# Patient Record
Sex: Male | Born: 1955 | Race: White | Hispanic: No | Smoking: Former smoker
Health system: Southern US, Community
[De-identification: ages and names within clinical notes are randomized; demographics above are authoritative.]

## PROBLEM LIST (undated history)

## (undated) DIAGNOSIS — E119 Type 2 diabetes mellitus without complications: Secondary | ICD-10-CM

## (undated) DIAGNOSIS — K219 Gastro-esophageal reflux disease without esophagitis: Secondary | ICD-10-CM

## (undated) DIAGNOSIS — H269 Unspecified cataract: Secondary | ICD-10-CM

## (undated) DIAGNOSIS — M5136 Other intervertebral disc degeneration, lumbar region: Secondary | ICD-10-CM

## (undated) DIAGNOSIS — E7800 Pure hypercholesterolemia, unspecified: Secondary | ICD-10-CM

## (undated) DIAGNOSIS — K6389 Other specified diseases of intestine: Secondary | ICD-10-CM

## (undated) DIAGNOSIS — F32A Depression, unspecified: Secondary | ICD-10-CM

## (undated) DIAGNOSIS — M549 Dorsalgia, unspecified: Secondary | ICD-10-CM

## (undated) DIAGNOSIS — I219 Acute myocardial infarction, unspecified: Secondary | ICD-10-CM

## (undated) DIAGNOSIS — I10 Essential (primary) hypertension: Secondary | ICD-10-CM

## (undated) DIAGNOSIS — F419 Anxiety disorder, unspecified: Secondary | ICD-10-CM

## (undated) DIAGNOSIS — I509 Heart failure, unspecified: Secondary | ICD-10-CM

## (undated) DIAGNOSIS — G8929 Other chronic pain: Secondary | ICD-10-CM

## (undated) DIAGNOSIS — R112 Nausea with vomiting, unspecified: Secondary | ICD-10-CM

## (undated) DIAGNOSIS — F329 Major depressive disorder, single episode, unspecified: Secondary | ICD-10-CM

## (undated) DIAGNOSIS — Z949 Transplanted organ and tissue status, unspecified: Secondary | ICD-10-CM

## (undated) DIAGNOSIS — Z9889 Other specified postprocedural states: Secondary | ICD-10-CM

## (undated) DIAGNOSIS — A0472 Enterocolitis due to Clostridium difficile, not specified as recurrent: Secondary | ICD-10-CM

## (undated) HISTORY — DX: Other chronic pain: G89.29

## (undated) HISTORY — DX: Gastro-esophageal reflux disease without esophagitis: K21.9

## (undated) HISTORY — DX: Dorsalgia, unspecified: M54.9

## (undated) HISTORY — DX: Other intervertebral disc degeneration, lumbar region: M51.36

## (undated) HISTORY — DX: Pure hypercholesterolemia, unspecified: E78.00

## (undated) HISTORY — DX: Enterocolitis due to Clostridium difficile, not specified as recurrent: A04.72

## (undated) HISTORY — DX: Acute myocardial infarction, unspecified: I21.9

## (undated) HISTORY — DX: Type 2 diabetes mellitus without complications: E11.9

## (undated) HISTORY — DX: Essential (primary) hypertension: I10

## (undated) HISTORY — PX: FEMUR SURGERY: SHX943

## (undated) HISTORY — DX: Depression, unspecified: F32.A

## (undated) HISTORY — DX: Heart failure, unspecified: I50.9

## (undated) HISTORY — DX: Anxiety disorder, unspecified: F41.9

## (undated) HISTORY — DX: Other specified diseases of intestine: K63.89

## (undated) HISTORY — PX: ANKLE SURGERY: SHX546

## (undated) HISTORY — DX: Major depressive disorder, single episode, unspecified: F32.9

## (undated) HISTORY — PX: EYE SURGERY: SHX253

## (undated) HISTORY — DX: Unspecified cataract: H26.9

---
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
## 1998-06-01 ENCOUNTER — Inpatient Hospital Stay (HOSPITAL_COMMUNITY): Admission: EM | Admit: 1998-06-01 | Discharge: 1998-06-06 | Payer: Self-pay | Admitting: Emergency Medicine

## 1998-07-26 ENCOUNTER — Inpatient Hospital Stay (HOSPITAL_COMMUNITY): Admission: AD | Admit: 1998-07-26 | Discharge: 1998-08-01 | Payer: Self-pay | Admitting: Cardiovascular Disease

## 1998-07-26 ENCOUNTER — Encounter: Payer: Self-pay | Admitting: Cardiovascular Disease

## 1998-07-28 ENCOUNTER — Encounter: Payer: Self-pay | Admitting: Cardiovascular Disease

## 1998-09-03 ENCOUNTER — Encounter: Payer: Self-pay | Admitting: Cardiovascular Disease

## 1998-09-03 ENCOUNTER — Inpatient Hospital Stay (HOSPITAL_COMMUNITY): Admission: EM | Admit: 1998-09-03 | Discharge: 1998-09-06 | Payer: Self-pay | Admitting: Emergency Medicine

## 1998-09-04 ENCOUNTER — Encounter: Payer: Self-pay | Admitting: Cardiovascular Disease

## 1998-10-27 DIAGNOSIS — I509 Heart failure, unspecified: Secondary | ICD-10-CM

## 1998-10-27 DIAGNOSIS — Z949 Transplanted organ and tissue status, unspecified: Secondary | ICD-10-CM

## 1998-10-27 DIAGNOSIS — I219 Acute myocardial infarction, unspecified: Secondary | ICD-10-CM

## 1998-10-27 HISTORY — DX: Acute myocardial infarction, unspecified: I21.9

## 1998-10-27 HISTORY — PX: HEART TRANSPLANT: SHX268

## 1998-10-27 HISTORY — DX: Heart failure, unspecified: I50.9

## 1998-10-27 HISTORY — DX: Transplanted organ and tissue status, unspecified: Z94.9

## 1998-12-18 ENCOUNTER — Emergency Department (HOSPITAL_COMMUNITY): Admission: EM | Admit: 1998-12-18 | Discharge: 1998-12-18 | Payer: Self-pay | Admitting: *Deleted

## 1999-01-07 ENCOUNTER — Emergency Department (HOSPITAL_COMMUNITY): Admission: EM | Admit: 1999-01-07 | Discharge: 1999-01-07 | Payer: Self-pay | Admitting: Emergency Medicine

## 1999-01-07 ENCOUNTER — Encounter: Payer: Self-pay | Admitting: Emergency Medicine

## 1999-06-28 ENCOUNTER — Emergency Department (HOSPITAL_COMMUNITY): Admission: EM | Admit: 1999-06-28 | Discharge: 1999-06-28 | Payer: Self-pay | Admitting: Emergency Medicine

## 1999-06-28 ENCOUNTER — Encounter: Payer: Self-pay | Admitting: Emergency Medicine

## 2001-05-19 ENCOUNTER — Encounter: Payer: Self-pay | Admitting: Emergency Medicine

## 2001-05-19 ENCOUNTER — Emergency Department (HOSPITAL_COMMUNITY): Admission: EM | Admit: 2001-05-19 | Discharge: 2001-05-19 | Payer: Self-pay | Admitting: Emergency Medicine

## 2002-10-27 DIAGNOSIS — M51369 Other intervertebral disc degeneration, lumbar region without mention of lumbar back pain or lower extremity pain: Secondary | ICD-10-CM

## 2002-10-27 DIAGNOSIS — M5136 Other intervertebral disc degeneration, lumbar region: Secondary | ICD-10-CM

## 2002-10-27 DIAGNOSIS — H269 Unspecified cataract: Secondary | ICD-10-CM

## 2002-10-27 HISTORY — DX: Unspecified cataract: H26.9

## 2002-10-27 HISTORY — DX: Other intervertebral disc degeneration, lumbar region without mention of lumbar back pain or lower extremity pain: M51.369

## 2002-10-27 HISTORY — DX: Other intervertebral disc degeneration, lumbar region: M51.36

## 2005-12-26 ENCOUNTER — Ambulatory Visit (HOSPITAL_COMMUNITY): Admission: RE | Admit: 2005-12-26 | Discharge: 2005-12-26 | Payer: Self-pay | Admitting: Pulmonary Disease

## 2006-07-14 ENCOUNTER — Emergency Department (HOSPITAL_COMMUNITY): Admission: EM | Admit: 2006-07-14 | Discharge: 2006-07-14 | Payer: Self-pay | Admitting: Emergency Medicine

## 2009-03-22 ENCOUNTER — Ambulatory Visit (HOSPITAL_COMMUNITY): Admission: RE | Admit: 2009-03-22 | Discharge: 2009-03-22 | Payer: Self-pay | Admitting: Pulmonary Disease

## 2009-09-07 ENCOUNTER — Ambulatory Visit (HOSPITAL_COMMUNITY): Admission: RE | Admit: 2009-09-07 | Discharge: 2009-09-07 | Payer: Self-pay | Admitting: Pulmonary Disease

## 2009-09-27 ENCOUNTER — Encounter (HOSPITAL_COMMUNITY): Admission: RE | Admit: 2009-09-27 | Discharge: 2009-10-23 | Payer: Self-pay | Admitting: Pulmonary Disease

## 2009-11-30 ENCOUNTER — Ambulatory Visit (HOSPITAL_COMMUNITY): Admission: RE | Admit: 2009-11-30 | Discharge: 2009-11-30 | Payer: Self-pay | Admitting: Pulmonary Disease

## 2011-02-04 ENCOUNTER — Ambulatory Visit (HOSPITAL_COMMUNITY): Payer: Self-pay | Admitting: Psychology

## 2011-02-11 ENCOUNTER — Ambulatory Visit (INDEPENDENT_AMBULATORY_CARE_PROVIDER_SITE_OTHER): Payer: Medicaid Other | Admitting: Psychology

## 2011-02-11 ENCOUNTER — Ambulatory Visit (HOSPITAL_COMMUNITY): Payer: Self-pay | Admitting: Psychology

## 2011-02-11 DIAGNOSIS — F332 Major depressive disorder, recurrent severe without psychotic features: Secondary | ICD-10-CM

## 2011-02-11 DIAGNOSIS — F4001 Agoraphobia with panic disorder: Secondary | ICD-10-CM

## 2011-03-03 ENCOUNTER — Encounter (INDEPENDENT_AMBULATORY_CARE_PROVIDER_SITE_OTHER): Payer: Medicaid Other | Admitting: Psychology

## 2011-03-03 DIAGNOSIS — F4001 Agoraphobia with panic disorder: Secondary | ICD-10-CM

## 2011-03-03 DIAGNOSIS — F332 Major depressive disorder, recurrent severe without psychotic features: Secondary | ICD-10-CM

## 2011-03-04 ENCOUNTER — Ambulatory Visit (INDEPENDENT_AMBULATORY_CARE_PROVIDER_SITE_OTHER): Payer: Medicaid Other | Admitting: Psychiatry

## 2011-03-04 DIAGNOSIS — F329 Major depressive disorder, single episode, unspecified: Secondary | ICD-10-CM

## 2011-03-07 NOTE — Group Therapy Note (Signed)
NAMELOLA, LOFARO             ACCOUNT NO.:  [id number]  MEDICAL RECORD NO.:  [id number]           PATIENT TYPE:  A  LOCATION:  [facility]                           FACILITY:  [facility]  PHYSICIAN:  Tejay Hubert T. Sydney Azure, M.D.   DATE OF BIRTH:  January 06, 1956                                PROGRESS NOTE   HISTORY OF PRESENT ILLNESS: The patient is a 55 year old Caucasian, divorced male who is referred from Dr. Juanetta Gosling for treatment of his depression and anxiety.  The patient endorsed that he has a long history of depression and anxiety for more than 20 years and has been taking Xanax, Valium and tried numerous antidepressant.  In the past 3 years, he feels his anxiety and depression is getting worse.  The patient had heart transplant in 2000. He has been not working for the past 3 years as unable to find job due to the recession.  The patient endorsed increased financial difficulties.  Feels sad as he cannot move around and work much.  He worries about his physical condition.  Endorsed decreased energy, decreased concentration, racing thoughts and poor sleep.  He also admitted having anxiety attacks once in awhile when he feels very nervous, anxious, hopeless and helpless about the future.  He has been taking Xanax, which has been increased last year from 0.5 three times a day to 1 mg 3 times.  He feels he has been feeling somewhat better in anxiety but however, he continued to endorse hopeless, helpless and worthless.  He admitted getting easily irritable, agitated, angry and frustrated.  He has been going to multiple doctors and  having difficulty keeping up with his appointments.  He has been going regularly to see his heart doctor.  Last year he was very nervous and felt that he is having a heart attack or his heart is not working and he had a cardiac catheterization and his heart transplant coordinator explained to him that his heart was okay and he is having anxiety and depression and recommended  to see psychiatrist.  Dr. Juanetta Gosling had tried him on Celexa, Prozac, Viibryd, Paxil and Cymbalta; however, the patient stopped taking all these medication, either due to poor response of sexual side effects.  He denies any violence, anger, paranoia, hallucination or psychosis.  Also denies any active suicidal thinking or homicidal thinking.  PAST PSYCHIATRIC HISTORY: As mentioned above, the patient has tried these above medication through primary care doctor; however, the only medicine he is taking is Xanax 1 mg 3 times a day.  He denies any history of inpatient psychiatric treatment or past suicidal attempt.  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  He also had bone grafting in 1975 due to fractured femur and ankle.  He also has degenerative disk disease, diabetes and cataracts.  PRIMARY CARE DOCTOR: Dr. Juanetta Gosling.  CURRENT MEDICATION: 1. Metformin 1000 mg 2 tablets a day. 2. Tacrolimus 1 mcg 2 tablets a day. 3. CellCept, he does not know the dose. 4. Ramipril 2.5 mg 1 daily. 5. Xanax 1 mg 3 times a day. 6. Amlodipine 10 mg daily. 7. Omeprazole 20 mg daily.  8. Aspirin 81 mg a day.  He also sees Dr .[facility]herly Hensen at [facility] for his regular heart checkup.  PSYCHOSOCIAL HISTORY: The patient was born and raised in [place], [place].  [place].  The patient has a 62 year old sister, a 62 year old sister who died several years ago.  Both of his parents her deceased.  The patient has extensive history of cardiac problem in the family.  Father died due to heart attack in his early age and mother died due to a stroke.  The patient is currently living with his girlfriend and his 7 year old son.  He is divorced and has no contact with his ex-wife who left her son when he was only 75 years old. The patient has a very good and steady relationship with his girlfriend who is helping him pay the utility bills.  The  patient only gets child support to support the family.  WORK AND EDUCATION: The patient is only 9th grade education.  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  ALCOHOL AND SUBSTANCE ABUSE: The patient endorsed history of heavy drinking until he had heart problem and he stopped, though he admitted, he still uses half pint of alcohol but lasts only 4 months.  He continued to smoke tobacco but no other illegal drugs.  FAMILY HISTORY: The patient endorsed 3 cousins have history of depression and anxiety, one of his cousins committed suicide..  MENTAL STATUS EXAM: The patient is a middle-aged man who appears to be his stated age.  He is casually dressed.  He maintained fair eye contact.  His speech is soft but clear and coherent.  His thought process was logical, linear and goal-directed.  He described his mood as anxious and depressed and affect was constricted.  There were no paranoia, delusions or obsession noted.  He denies any auditory hallucinations, suicidal thoughts or homicidal thoughts.  Attention and concentration were distracted at times but overall he kept the conversation intact.  He is alert and oriented x3.  His insight, judgment, impulse control were okay.  DIAGNOSIS: Axis I:  Mood disorder due to general medical condition, major depressive disorder recurrent, anxiety disorder not otherwise specified. Axis II:  Deferred. Axis III:  Please see medical history. Axis IV:  Moderate. Axis V:  55-60.  PLAN: I talked with the patient in detail.  He admitted he has taken the antidepressant in the past but stopped due to side effects, especially sexual side effects.  He talked about starting the Wellbutrin, which he had taken in the past when he was trying to quit smoking.  He is willing to try that medication.  We will start 75 mg in the morning; however, I strongly recommended to contact his primary care doctor and heart  doctor if it is not contraindicated due to the history of his heart transplant. Patient told that he will call this afternoon.  His heart transplant coordinator is Scott in [place] and also he will leave the message for Dr. Juanetta Gosling if it is okay that he can start the Wellbutrin 75 mg in the morning.  I explained the risks and benefits of medication in detail and recommended strongly to call us back if he has any side effects or any questions.  He will continue to see Dr.  Kieth Brightly for one-to-one counseling and therapy and I will see him again in 2 weeks.     Mima Cranmore T. Lolly Mustache, M.D.  STA/MEDQ  D:  03/04/2011  T:  03/04/2011  Job:  [id number]  Electronically Signed by Kathryne Sharper M.D. on 03/07/2011 09:15:11 AM

## 2011-03-17 ENCOUNTER — Encounter (INDEPENDENT_AMBULATORY_CARE_PROVIDER_SITE_OTHER): Payer: Medicaid Other | Admitting: Psychology

## 2011-03-17 DIAGNOSIS — F332 Major depressive disorder, recurrent severe without psychotic features: Secondary | ICD-10-CM

## 2011-03-17 DIAGNOSIS — F4001 Agoraphobia with panic disorder: Secondary | ICD-10-CM

## 2011-03-18 ENCOUNTER — Encounter (INDEPENDENT_AMBULATORY_CARE_PROVIDER_SITE_OTHER): Payer: Medicaid Other | Admitting: Psychiatry

## 2011-03-18 DIAGNOSIS — F329 Major depressive disorder, single episode, unspecified: Secondary | ICD-10-CM

## 2011-04-01 ENCOUNTER — Encounter (HOSPITAL_COMMUNITY): Payer: Medicaid Other | Admitting: Psychology

## 2011-04-07 ENCOUNTER — Ambulatory Visit (INDEPENDENT_AMBULATORY_CARE_PROVIDER_SITE_OTHER): Payer: Medicaid Other | Admitting: Psychiatry

## 2011-04-07 DIAGNOSIS — F411 Generalized anxiety disorder: Secondary | ICD-10-CM

## 2011-04-07 DIAGNOSIS — F331 Major depressive disorder, recurrent, moderate: Secondary | ICD-10-CM

## 2011-04-14 ENCOUNTER — Encounter (INDEPENDENT_AMBULATORY_CARE_PROVIDER_SITE_OTHER): Payer: Medicaid Other | Admitting: Psychiatry

## 2011-04-14 DIAGNOSIS — F331 Major depressive disorder, recurrent, moderate: Secondary | ICD-10-CM

## 2011-04-14 DIAGNOSIS — F411 Generalized anxiety disorder: Secondary | ICD-10-CM

## 2011-04-15 ENCOUNTER — Encounter (INDEPENDENT_AMBULATORY_CARE_PROVIDER_SITE_OTHER): Payer: Medicaid Other | Admitting: Psychiatry

## 2011-04-15 DIAGNOSIS — F329 Major depressive disorder, single episode, unspecified: Secondary | ICD-10-CM

## 2011-04-28 ENCOUNTER — Encounter (INDEPENDENT_AMBULATORY_CARE_PROVIDER_SITE_OTHER): Payer: Medicaid Other | Admitting: Psychiatry

## 2011-04-28 DIAGNOSIS — F331 Major depressive disorder, recurrent, moderate: Secondary | ICD-10-CM

## 2011-04-28 DIAGNOSIS — F411 Generalized anxiety disorder: Secondary | ICD-10-CM

## 2011-04-29 ENCOUNTER — Encounter (INDEPENDENT_AMBULATORY_CARE_PROVIDER_SITE_OTHER): Payer: Medicaid Other | Admitting: Psychiatry

## 2011-04-29 DIAGNOSIS — F329 Major depressive disorder, single episode, unspecified: Secondary | ICD-10-CM

## 2011-05-12 ENCOUNTER — Encounter (INDEPENDENT_AMBULATORY_CARE_PROVIDER_SITE_OTHER): Payer: Medicaid Other | Admitting: Psychiatry

## 2011-05-12 DIAGNOSIS — F331 Major depressive disorder, recurrent, moderate: Secondary | ICD-10-CM

## 2011-05-12 DIAGNOSIS — F411 Generalized anxiety disorder: Secondary | ICD-10-CM

## 2011-05-26 ENCOUNTER — Encounter (INDEPENDENT_AMBULATORY_CARE_PROVIDER_SITE_OTHER): Payer: Medicaid Other | Admitting: Psychiatry

## 2011-05-26 DIAGNOSIS — F411 Generalized anxiety disorder: Secondary | ICD-10-CM

## 2011-05-26 DIAGNOSIS — F331 Major depressive disorder, recurrent, moderate: Secondary | ICD-10-CM

## 2011-06-10 ENCOUNTER — Encounter (INDEPENDENT_AMBULATORY_CARE_PROVIDER_SITE_OTHER): Payer: Medicaid Other | Admitting: Psychiatry

## 2011-06-10 DIAGNOSIS — F329 Major depressive disorder, single episode, unspecified: Secondary | ICD-10-CM

## 2011-06-16 ENCOUNTER — Encounter (INDEPENDENT_AMBULATORY_CARE_PROVIDER_SITE_OTHER): Payer: Medicaid Other | Admitting: Psychiatry

## 2011-06-16 DIAGNOSIS — F411 Generalized anxiety disorder: Secondary | ICD-10-CM

## 2011-06-16 DIAGNOSIS — F331 Major depressive disorder, recurrent, moderate: Secondary | ICD-10-CM

## 2011-07-21 ENCOUNTER — Encounter (INDEPENDENT_AMBULATORY_CARE_PROVIDER_SITE_OTHER): Payer: Medicaid Other | Admitting: Psychiatry

## 2011-07-21 DIAGNOSIS — F331 Major depressive disorder, recurrent, moderate: Secondary | ICD-10-CM

## 2011-07-21 DIAGNOSIS — F411 Generalized anxiety disorder: Secondary | ICD-10-CM

## 2011-08-12 ENCOUNTER — Encounter (INDEPENDENT_AMBULATORY_CARE_PROVIDER_SITE_OTHER): Payer: Medicaid Other | Admitting: Psychiatry

## 2011-08-12 DIAGNOSIS — F329 Major depressive disorder, single episode, unspecified: Secondary | ICD-10-CM

## 2011-08-20 ENCOUNTER — Encounter (INDEPENDENT_AMBULATORY_CARE_PROVIDER_SITE_OTHER): Payer: Medicaid Other | Admitting: Psychiatry

## 2011-08-20 DIAGNOSIS — F331 Major depressive disorder, recurrent, moderate: Secondary | ICD-10-CM

## 2011-08-20 DIAGNOSIS — F411 Generalized anxiety disorder: Secondary | ICD-10-CM

## 2011-09-17 ENCOUNTER — Ambulatory Visit (INDEPENDENT_AMBULATORY_CARE_PROVIDER_SITE_OTHER): Payer: Medicaid Other | Admitting: Psychiatry

## 2011-09-17 ENCOUNTER — Encounter (HOSPITAL_COMMUNITY): Payer: Self-pay | Admitting: Psychiatry

## 2011-09-17 DIAGNOSIS — F419 Anxiety disorder, unspecified: Secondary | ICD-10-CM

## 2011-09-17 DIAGNOSIS — F411 Generalized anxiety disorder: Secondary | ICD-10-CM

## 2011-09-17 DIAGNOSIS — F331 Major depressive disorder, recurrent, moderate: Secondary | ICD-10-CM

## 2011-09-17 NOTE — Progress Notes (Signed)
Patient:  Anthony Price   DOB: 1956-02-05  MR Number: [id number]  [facility]:  [address] [place],  [place], [address]  Start: Wednesday 09/17/2011 10:30 AM End: Wednesday 09/17/2011 11:30 AM  Provider/Observer:     Florencia Reasons, MSW, LCSW   Chief Complaint:      Chief Complaint  Patient presents with  . Anxiety  . Depression    Reason For Service:     The patient was referred for services by psychiatrist Dr. Lolly Mustache seen to improve coping and stress management skills. Patient initially was referred to this practice by his primary care physician for treatment of chronic depression and anxiety. Patient reports suffering from symptoms for 20+ years with symptoms worsening in the past 3 years. 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 He has experienced stress and frustration regarding his reduced physical functioning as well as being unable to provide financially as he thinks a man should.  Interventions Strategy:  Supportive therapy, cognitive behavioral therapy  Participation Level:   Active  Participation Quality:  Attentive      Behavioral Observation:  Fairly Groomed, Alert, and Appropriate.   Current Psychosocial Factors: Patient continues to adjust to reduced physical functioning and a fixed income  Content of Session:   Reviewing symptoms, reviewing treatment plan and discussing progress, termination  Current Status:   The patient reports continued improved mood, absence of panic attacks, and decreased anxiety along with increased positive thought patterns.  Patient Progress:   Good. The patient reports doing well since last session. He has maintained involvement in activities. He has not experienced any panic attacks. He continues to experience mild anxiety at times but has successfully challenged his perceptions of panic. He also reports decreased worry about his son.  Patient reports feeling better now that he does have some income He continues to receive strong emotional support from his girlfriend. Patient is pleased with his progress in therapy. The patient and therapist agree that therapy services will be discontinued at this time as patient has accomplished his goals. He will continue to see Dr. Lolly Mustache for medication management. He is encouraged to call this practice should he need therapy in the future.  Target Goals:   1. Decrease anxiety and excessive worry. 2. Improve coping and relaxation techniques.  Last Reviewed:   09/17/2011  Goals Addressed Today:    Decreased anxiety and excessive worry, improve coping and relaxation skills  Impression/Diagnosis:   The patient has a long-standing history of depression and anxiety. Currently symptoms are being managed well with medication and patient's use of coping skills. Diagnosis: Maj. depressive disorder, anxiety disorder NOS  Diagnosis:  Axis I:  1. Major depressive disorder, recurrent episode, moderate   2. Anxiety             Axis II: Deferred

## 2011-09-17 NOTE — Patient Instructions (Signed)
Patient will discontinue therapy at this time but will continue to see Dr. Lolly Mustache for medication management.  Patient also agrees to continue to use relaxation techniques as well as healthy coping skills.  Patient also will contact this practice should he need therapy in the future.

## 2011-09-29 ENCOUNTER — Other Ambulatory Visit (HOSPITAL_COMMUNITY): Payer: Self-pay | Admitting: Psychiatry

## 2011-10-03 ENCOUNTER — Encounter (HOSPITAL_COMMUNITY): Payer: Self-pay | Admitting: Psychiatry

## 2011-10-03 NOTE — Progress Notes (Signed)
Outpatient Therapist Discharge Summary  Anthony Price    04/18/1956   MR#  [id number]  Admission Date: 04/07/2011    Discharge Date:  10/03/2011  Reason for Discharge:  Treatment completed  Medications:  See medications in chart  Diagnosis:  Axis I:  Maj. depressive disorder recurrent 296.32, anxiety disorder NOS 300.00     [name]

## 2011-10-07 ENCOUNTER — Encounter (HOSPITAL_COMMUNITY): Payer: Self-pay | Admitting: Psychiatry

## 2011-10-07 ENCOUNTER — Encounter (HOSPITAL_COMMUNITY): Payer: Medicaid Other | Admitting: Psychiatry

## 2011-10-07 ENCOUNTER — Ambulatory Visit (INDEPENDENT_AMBULATORY_CARE_PROVIDER_SITE_OTHER): Payer: Medicaid Other | Admitting: Psychiatry

## 2011-10-07 DIAGNOSIS — F329 Major depressive disorder, single episode, unspecified: Secondary | ICD-10-CM

## 2011-10-07 MED ORDER — BUPROPION HCL ER (SR) 200 MG PO TB12: 200.0000 mg | ORAL_TABLET | Freq: Every day | ORAL | Status: DC

## 2011-10-07 NOTE — Progress Notes (Signed)
Patient came for her followup appointment. He is taking Wellbutrin now 200 mg. He reported no side effects of medication. His girlfriend also noticed that he is doing much improvement since the dose is increased. Though he continued to endorse passive hopeless feeling but recently he has no crying spells and he is sleeping better. He has less intensive negative thoughts. He denies any agitation anger or mood swings. He is seeing therapist however recently therapist told him that he may not need any future appointment as he he's been doing much better. Patient has no concern about the medication. He is seen his heart transplant surgeon regularly. He has provided information to his coordinator about increase Wellbutrin. He had a good Thanksgiving and he is looking forward to have a good Christmas.  Mental status examination Patient is casually dressed and well-groomed. His speech is soft clear and coherent. He denies any active or passive suicidal thoughts or homicidal thoughts. There are no psychotic symptoms present. He denies any auditory or visual hallucination. His attention and concentration is improved. He described his mood is anxious however his affect is improved from the past. He's alert and oriented x3. His insight judgment and impulse control is okay.  Assessment Depressive disorder NOS, depressive disorder due to general medical condition  Plan I will continue his Wellbutrin SR 200 mg daily. He is also getting Xanax from his primary care physician. I have explained risks and benefits of medication in detail. I recommended to call us if he has any question concerns or if he feel worsening of his symptoms otherwise I will see him again in 3 months

## 2011-12-30 ENCOUNTER — Encounter (HOSPITAL_COMMUNITY): Payer: Self-pay | Admitting: Psychiatry

## 2011-12-30 ENCOUNTER — Ambulatory Visit (HOSPITAL_COMMUNITY): Payer: Medicaid Other | Admitting: Psychiatry

## 2011-12-30 DIAGNOSIS — F32A Depression, unspecified: Secondary | ICD-10-CM

## 2011-12-30 DIAGNOSIS — F418 Other specified anxiety disorders: Secondary | ICD-10-CM | POA: Insufficient documentation

## 2011-12-30 DIAGNOSIS — F329 Major depressive disorder, single episode, unspecified: Secondary | ICD-10-CM

## 2011-12-30 MED ORDER — BUPROPION HCL ER (XL) 300 MG PO TB24: 300.0000 mg | ORAL_TABLET | ORAL | Status: DC

## 2011-12-30 NOTE — Progress Notes (Signed)
Patient came for her followup appointment. He is compliant with the Wellbutrin. He reported no side effects of medication. Patient is still has some moments when he is very anxious nervous but decreased energy. Patient complained of night sweats which could be bad dreams and nervousness. He wants to try increase Wellbutrin. At this time he has no side effects of medication. He denies any agitation anger or mood swings. He denies any crying spells. He is tolerating his medication. He denies any hopelessness or helplessness. He is sleeping fine however he has night sweats.  He see his transplant coordinator very regularly.  Medical history Hyperlipidemia, high blood pressure, and chronic back pain, ankle surgery and heart transplant   Mental status examination Patient is casually dressed and well-groomed. His speech is soft clear and coherent. He denies any active or passive suicidal thoughts or homicidal thoughts. There are no psychotic symptoms present. He denies any auditory or visual hallucination. His attention and concentration is improved. He described his mood is anxious however his affect is improved from the past. He's alert and oriented x3. His insight judgment and impulse control is okay.  Assessment Axis I Depressive disorder NOS, depressive disorder due to general medical condition Axis II deferred Axis III see medical history Axis IV mild to moderate  Plan I will increase his Wellbutrin to 300 mg daily. He is also getting Xanax from his primary care physician. I have explained risks and benefits of medication in detail. I recommended to call us if he has any question concerns or if he feel worsening of his symptoms otherwise I will see him again in 3 months

## 2012-01-29 ENCOUNTER — Encounter (HOSPITAL_COMMUNITY): Payer: Self-pay | Admitting: Psychiatry

## 2012-01-29 ENCOUNTER — Ambulatory Visit (INDEPENDENT_AMBULATORY_CARE_PROVIDER_SITE_OTHER): Payer: Medicaid Other | Admitting: Psychiatry

## 2012-01-29 DIAGNOSIS — F32A Depression, unspecified: Secondary | ICD-10-CM

## 2012-01-29 DIAGNOSIS — F329 Major depressive disorder, single episode, unspecified: Secondary | ICD-10-CM

## 2012-01-29 MED ORDER — BUPROPION HCL ER (XL) 300 MG PO TB24: 300.0000 mg | ORAL_TABLET | ORAL | Status: DC

## 2012-01-29 NOTE — Progress Notes (Signed)
Chief complaint Medication management and followup.  History of present illness Patient's 56 year old Caucasian male who came for her followup appointment.  His medication was increased on his last visit and patient complained of decreased energy and motivation to do things.  He is tolerating his Wellbutrin very well.  Denies any chest pain, palpitation however endorse insomnia but he reported he has sleep issue from time .  He is more involved in his daily routine , he is doing house projects and fixing his mental property.  He likes to continue his current medication has medication reported increased energy and motivation.  He is seeing distress plan court letter very regularly.  He denies agitation anger or mood swings.  He is not drinking alcohol or using any illegal substances.  He continues to have bad dreams but this has been a long issue and he has handled this issue very well .    Current psychiatric medication Wellbutrin XL 300 mg daily Xanax 1 mg up to 3 times a day prescribed by his primary care physician.  Medical history Hyperlipidemia, high blood pressure, and chronic back pain, ankle surgery and heart transplant   Mental status examination Patient is casually dressed and well-groomed.  He is pleasant , calm and cooperative.  He maintained good eye contact.  His speech is soft clear and coherent. He denies any active or passive suicidal thoughts or homicidal thoughts. There are no psychotic symptoms present. He denies any auditory or visual hallucination. His attention and concentration is improved. He described his mood is okay and his affect is mood congruent.  He's alert and oriented x3. His insight judgment and impulse control is okay.  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 He is also getting  Xanax from his primary care physician. I have explained risks and benefits of medication in detail. I recommended to call us if he has any question concerns or if he feel worsening of his symptoms otherwise I will see him again in 2 months

## 2012-04-01 ENCOUNTER — Encounter (HOSPITAL_COMMUNITY): Payer: Self-pay | Admitting: Psychiatry

## 2012-04-01 ENCOUNTER — Ambulatory Visit (INDEPENDENT_AMBULATORY_CARE_PROVIDER_SITE_OTHER): Payer: Medicaid Other | Admitting: Psychiatry

## 2012-04-01 DIAGNOSIS — F32A Depression, unspecified: Secondary | ICD-10-CM

## 2012-04-01 DIAGNOSIS — F329 Major depressive disorder, single episode, unspecified: Secondary | ICD-10-CM

## 2012-04-01 MED ORDER — BUPROPION HCL ER (XL) 300 MG PO TB24: 300.0000 mg | ORAL_TABLET | ORAL | Status: DC

## 2012-04-01 NOTE — Progress Notes (Signed)
Chief complaint Medication management and followup.  History of present illness Patient's 56 year old Caucasian male who came for her followup appointment.  He usually comes on his appointment with his girlfriend however today he came by himself.  He's been compliant with his medication and reported no side effects.  He is taking Wellbutrin XL 300 mg daily.  Overall he feels more energy and motivation to do things.  He is spending some time at his backyard .  He is sleeping better.  He denies any agitation anger mood swing.  Denies any crying spells.  His attention and concentration is improved from the past.  He denies any tremors or shakes.  He was complaining of insomnia in the beginning and he was recommended by his primary care physician to try melatonin which he tried but did not see much improvement.  His sleep is much improved and he is not taking any medicine for insomnia.  He has recently seen his primary care physician Dr. Juanetta Gosling and his transplant Dr. for a regular checkup.  He is relieved that his blood tests are normal.  His transplant coordinator is recently retired and he has not seen his Engineer, maintenance.  He scheduled to see his new coordinator very soon.  He likes to continue his current psychiatric medication.  He is not drinking or using any illegal substance.  Current psychiatric medication Wellbutrin XL 300 mg daily Xanax 1 mg up to 3 times a day prescribed by his primary care physician.  Medical history Hyperlipidemia, high blood pressure, and chronic back pain, ankle surgery and heart transplant   Mental status examination Patient is casually dressed and groomed.  He is pleasant , calm and cooperative.  He maintained good eye contact.  His speech is soft clear and coherent. He denies any active or passive suicidal thoughts or homicidal thoughts. There are no psychotic symptoms present. He denies any auditory or visual hallucination. His attention and concentration is improved.  He described his mood is okay and his affect is mood congruent.  He's alert and oriented x3. His insight judgment and impulse control is okay.  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 He is also getting Xanax from his primary care physician.  I recommend to have his recent blood work faxed to [place].  I have explained risks and benefits of medication in detail. I recommended to call us if he has any question concerns or if he feel worsening of his symptoms otherwise I will see him again in 2 months  Portion of this note is voice recognition software and may contain typographical error.

## 2012-05-25 ENCOUNTER — Other Ambulatory Visit: Payer: Self-pay | Admitting: Gastroenterology

## 2012-05-25 ENCOUNTER — Encounter: Payer: Self-pay | Admitting: Gastroenterology

## 2012-05-25 ENCOUNTER — Ambulatory Visit (INDEPENDENT_AMBULATORY_CARE_PROVIDER_SITE_OTHER): Payer: Medicaid Other | Admitting: Gastroenterology

## 2012-05-25 VITALS — BP 133/83 | HR 106 | Temp 98.5°F | Ht 68.0 in | Wt 159.8 lb

## 2012-05-25 DIAGNOSIS — Z1211 Encounter for screening for malignant neoplasm of colon: Secondary | ICD-10-CM

## 2012-05-25 DIAGNOSIS — K219 Gastro-esophageal reflux disease without esophagitis: Secondary | ICD-10-CM

## 2012-05-25 MED ORDER — PEG 3350-KCL-NA BICARB-NACL 420 G PO SOLR: ORAL | Status: AC

## 2012-05-25 NOTE — Patient Instructions (Addendum)
Please review the high fiber diet. Follow this diet and rink 6-8 glasses of water daily.   Start taking a probiotic daily. We have provided samples for you. You may also get this over-the-counter.  We have set you up for a colonoscopy and upper endoscopy with Dr. Darrick Penna in the near future. Continue to take Omeprazole twice a day, 30 minutes before breakfast and supper for best therapy.
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
## 2012-05-27 HISTORY — PX: COLONOSCOPY: SHX174

## 2012-05-27 HISTORY — PX: ESOPHAGOGASTRODUODENOSCOPY: SHX1529

## 2012-05-30 DIAGNOSIS — K219 Gastro-esophageal reflux disease without esophagitis: Secondary | ICD-10-CM | POA: Insufficient documentation

## 2012-05-30 DIAGNOSIS — Z1211 Encounter for screening for malignant neoplasm of colon: Secondary | ICD-10-CM | POA: Insufficient documentation

## 2012-05-30 NOTE — Assessment & Plan Note (Signed)
Chronic, refractory despite BID dosing. No dysphagia. Epigastric "tightness" noted, feels like burping would relieve. Due to dyspepsia and refractory GERD, proceed with EGD. +nocturnal reflux likely related to eating behaviors. Discussed avoidance of eating foods late at night.   Proceed with upper endoscopy at time of TCS, with Propofol, in the near future with Dr. Darrick Penna. The risks, benefits, and alternatives have been discussed in detail with patient. They have stated understanding and desire to proceed.

## 2012-05-30 NOTE — Assessment & Plan Note (Signed)
Initial screening colonoscopy.Notes hx of loose stools, 2-3 at most per day, followed by periods of normal bowel movements. Denies any signs of melena, hematochezia. Chronic bloating per pt, worse when anxious. Question component of IBS, need for dietary modification. Eats bowl of ice cream every night. As of note, reportedly anemic on outside labs from [facility], drawn during routine follow-up. Will request. No signs of overt GI bleeding.   Proceed with colonoscopy with Dr. Darrick Penna in the near future. TO BE PERFORMED WITH PROPOFOL DUE TO POLYPHARMACY, CHRONIC ANXIETY MEDS.  The risks, benefits, and alternatives have been discussed in detail with the patient. They state understanding and desire to proceed.  High fiber diet Probiotic

## 2012-05-31 NOTE — Progress Notes (Signed)
Faxed to PCP

## 2012-06-01 ENCOUNTER — Encounter (HOSPITAL_COMMUNITY): Payer: Self-pay | Admitting: Psychiatry

## 2012-06-01 ENCOUNTER — Ambulatory Visit (INDEPENDENT_AMBULATORY_CARE_PROVIDER_SITE_OTHER): Payer: Self-pay | Admitting: Psychiatry

## 2012-06-01 VITALS — BP 130/85 | HR 105 | Wt 159.0 lb

## 2012-06-01 DIAGNOSIS — F329 Major depressive disorder, single episode, unspecified: Secondary | ICD-10-CM

## 2012-06-01 DIAGNOSIS — F32A Depression, unspecified: Secondary | ICD-10-CM

## 2012-06-01 MED ORDER — BUPROPION HCL ER (XL) 300 MG PO TB24: 300.0000 mg | ORAL_TABLET | ORAL | Status: DC

## 2012-06-01 NOTE — Progress Notes (Signed)
Chief complaint Medication management and followup.  History of present illness Patient's 56 year old Caucasian male who came for his followup appointment with his girlfriend. He's been compliant with his medication and reported no side effects.  He is taking Wellbutrin XL 300 mg daily.  Recently he complained of tiredness and decreased energy but he denies any agitation anger or any depressive symptoms.  He was seen by ambulance when he call 911 due to headache few weeks ago however it turned out that he has high blood pressure and he forgot to take his medication.  He scheduled to see his cardiac transplant Dr. today at [facility] .  Overall he described his mood is better with the medication.  He has any crying spells or any social isolation.  His girlfriend also endorse much improvement with Wellbutrin.  He denies any tremors or shakes.  He is sleeping better.  He denies any recent medication changes.  He see Dr. Ollen Bowl for his primary care physician.  He's not drinking or using any illegal substance.    Current psychiatric medication Wellbutrin XL 300 mg daily Xanax 1 mg up to 3 times a day prescribed by his primary care physician.  Medical history Hyperlipidemia, high blood pressure, and chronic back pain, ankle surgery and heart transplant   Mental status examination Patient is casually dressed and groomed.  He is pleasant , calm and cooperative.  He maintained fair  eye contact.  His speech is soft clear and coherent. He denies any active or passive suicidal thoughts or homicidal thoughts. There are no psychotic symptoms present. He denies any auditory or visual hallucination. His attention and concentration is improved. He described his mood is okay and his affect is mood congruent.  He's alert and oriented x3. His insight judgment and impulse control is okay.  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 He is also getting Xanax from his primary care physician.  I recommend to have his recent blood work faxed to [place] along with any changes in his medication.  He scheduled to see his heart transplant physician today.  I have explained risks and benefits of medication in detail. I recommended to call us if he has any question concerns or if he feel worsening of his symptoms otherwise I will see him again in 3 months.  Time spent 30 minutes.  Portion of this note is voice recognition software and may contain typographical error.

## 2012-06-02 ENCOUNTER — Encounter (HOSPITAL_COMMUNITY): Payer: Self-pay | Admitting: Pharmacy Technician

## 2012-06-08 ENCOUNTER — Encounter (HOSPITAL_COMMUNITY): Payer: Self-pay

## 2012-06-08 ENCOUNTER — Encounter (HOSPITAL_COMMUNITY)
Admission: RE | Admit: 2012-06-08 | Discharge: 2012-06-08 | Disposition: A | Discharge status: Home / Self Care | Payer: Medicaid Other | Source: Ambulatory Visit | Attending: Gastroenterology | Admitting: Gastroenterology

## 2012-06-08 HISTORY — DX: Nausea with vomiting, unspecified: R11.2

## 2012-06-08 HISTORY — DX: Other specified postprocedural states: Z98.890

## 2012-06-08 HISTORY — DX: Transplanted organ and tissue status, unspecified: Z94.9

## 2012-06-08 LAB — BASIC METABOLIC PANEL
BUN: 10 mg/dL (ref 6–23)
Creatinine, Ser: 0.98 mg/dL (ref 0.50–1.35)
GFR calc Af Amer: >90 mL/min (ref >90)
GFR calc non Af Amer: >90 mL/min (ref >90)

## 2012-06-08 LAB — HEMOGLOBIN AND HEMATOCRIT, BLOOD: Hemoglobin: 16 g/dL (ref 13.0–17.0)

## 2012-06-08 NOTE — Patient Instructions (Addendum)
20 MALON BRANTON  06/08/2012   Your procedure is scheduled on:  06/15/2012  Report to [facility] at  615  AM.  Call this number if you have problems the morning of surgery: [phone]   Remember:   Do not eat food:After Midnight.  May have clear liquids:until Midnight .    Take these medicines the morning of surgery with A SIP OF WATER: omeprazole,ramipril,aricept,prograf,wellbutrin   Do not wear jewelry, make-up or nail polish.  Do not wear lotions, powders, or perfumes. You may wear deodorant.  Do not shave 48 hours prior to surgery. Men may shave face and neck.  Do not bring valuables to the hospital.  Contacts, dentures or bridgework may not be worn into surgery.  Leave suitcase in the car. After surgery it may be brought to your room.  For patients admitted to the hospital, checkout time is 11:00 AM the day of discharge.   Patients discharged the day of surgery will not be allowed to drive home.  Name and phone number of your driver: family  Special Instructions: N/A   Please read over the following fact sheets that you were given: Pain Booklet, Surgical Site Infection Prevention, Anesthesia Post-op Instructions and Care and Recovery After Surgery   Colonoscopy A colonoscopy is an exam to evaluate your entire colon. In this exam, your colon is cleansed. A long fiberoptic tube is inserted through your rectum and into your colon. The fiberoptic scope (endoscope) is a long bundle of enclosed and very flexible fibers. These fibers transmit light to the area examined and send images from that area to your caregiver. Discomfort is usually minimal. You may be given a drug to help you sleep (sedative) during or prior to the procedure. This exam helps to detect lumps (tumors), polyps, inflammation, and areas of bleeding. Your caregiver may also take a small piece of tissue (biopsy) that will be examined under a microscope. LET YOUR CAREGIVER KNOW ABOUT:   Allergies to food or  medicine.   Medicines taken, including vitamins, herbs, eyedrops, over-the-counter medicines, and creams.   Use of steroids (by mouth or creams).   Previous problems with anesthetics or numbing medicines.   History of bleeding problems or blood clots.   Previous surgery.   Other health problems, including diabetes and kidney problems.   Possibility of pregnancy, if this applies.  BEFORE THE PROCEDURE   A clear liquid diet may be required for 2 days before the exam.   Ask your caregiver about changing or stopping your regular medications.   Liquid injections (enemas) or laxatives may be required.   A large amount of electrolyte solution may be given to you to drink over a short period of time. This solution is used to clean out your colon.   You should be present 60 minutes prior to your procedure or as directed by your caregiver.  AFTER THE PROCEDURE   If you received a sedative or pain relieving medication, you will need to arrange for someone to drive you home.   Occasionally, there is a little blood passed with the first bowel movement. Do not be concerned.  FINDING OUT THE RESULTS OF YOUR TEST Not all test results are available during your visit. If your test results are not back during the visit, make an appointment with your caregiver to find out the results. Do not assume everything is normal if you have not heard from your caregiver or the medical facility. It is important for you to  follow up on all of your test results. HOME CARE INSTRUCTIONS   It is not unusual to pass moderate amounts of gas and experience mild abdominal cramping following the procedure. This is due to air being used to inflate your colon during the exam. Walking or a warm pack on your belly (abdomen) may help.   You may resume all normal meals and activities after sedatives and medicines have worn off.   Only take over-the-counter or prescription medicines for pain, discomfort, or fever as directed  by your caregiver. Do not use aspirin or blood thinners if a biopsy was taken. Consult your caregiver for medicine usage if biopsies were taken.  SEEK IMMEDIATE MEDICAL CARE IF:   You have a fever.   You pass large blood clots or fill a toilet with blood following the procedure. This may also occur 10 to 14 days following the procedure. This is more likely if a biopsy was taken.   You develop abdominal pain that keeps getting worse and cannot be relieved with medicine.  Document Released: 10/10/2000 Document Revised: 10/02/2011 Document Reviewed: 05/25/2008 [facility] Patient Information 2012 [place], [place].Esophagogastroduodenoscopy This is an endoscopic procedure (a procedure that uses a device like a flexible telescope) that allows your caregiver to view the upper stomach and small bowel. This test allows your caregiver to look at the esophagus. The esophagus carries food from your mouth to your stomach. They can also look at your duodenum. This is the first part of the small intestine that attaches to the stomach. This test is used to detect problems in the bowel such as ulcers and inflammation. PREPARATION FOR TEST Nothing to eat after midnight the day before the test. NORMAL FINDINGS Normal esophagus, stomach, and duodenum. Ranges for normal findings may vary among different laboratories and hospitals. You should always check with your doctor after having lab work or other tests done to discuss the meaning of your test results and whether your values are considered within normal limits. MEANING OF TEST  Your caregiver will go over the test results with you and discuss the importance and meaning of your results, as well as treatment options and the need for additional tests if necessary. OBTAINING THE TEST RESULTS It is your responsibility to obtain your test results. Ask the lab or department performing the test when and how you will get your results. Document Released: 02/13/2005 Document  Revised: 10/02/2011 Document Reviewed: 09/22/2008 [facility] Patient Information 2012 [place], [place].

## 2012-06-08 NOTE — Pre-Procedure Instructions (Signed)
Dr Jayme Cloud aware of previous heart transplant. Patient had visit with Dr Chang-cardiologist last week at [facility]. No further orders given. Does not want copy of echo done last week.

## 2012-06-15 ENCOUNTER — Encounter (HOSPITAL_COMMUNITY): Payer: Self-pay | Admitting: Anesthesiology

## 2012-06-15 ENCOUNTER — Ambulatory Visit (HOSPITAL_COMMUNITY)
Admission: RE | Admit: 2012-06-15 | Discharge: 2012-06-15 | Disposition: A | Discharge status: Home / Self Care | Payer: Medicaid Other | Source: Ambulatory Visit | Attending: Gastroenterology | Admitting: Gastroenterology

## 2012-06-15 ENCOUNTER — Encounter (HOSPITAL_COMMUNITY)
Admission: RE | Disposition: A | Discharge status: Home / Self Care | Payer: Self-pay | Source: Ambulatory Visit | Attending: Gastroenterology

## 2012-06-15 ENCOUNTER — Encounter (HOSPITAL_COMMUNITY): Payer: Self-pay | Admitting: *Deleted

## 2012-06-15 ENCOUNTER — Ambulatory Visit (HOSPITAL_COMMUNITY): Payer: Medicaid Other | Admitting: Anesthesiology

## 2012-06-15 DIAGNOSIS — K2940 Chronic atrophic gastritis without bleeding: Secondary | ICD-10-CM | POA: Insufficient documentation

## 2012-06-15 DIAGNOSIS — I10 Essential (primary) hypertension: Secondary | ICD-10-CM | POA: Insufficient documentation

## 2012-06-15 DIAGNOSIS — Z1211 Encounter for screening for malignant neoplasm of colon: Secondary | ICD-10-CM

## 2012-06-15 DIAGNOSIS — K2970 Gastritis, unspecified, without bleeding: Secondary | ICD-10-CM

## 2012-06-15 DIAGNOSIS — E7800 Pure hypercholesterolemia, unspecified: Secondary | ICD-10-CM | POA: Insufficient documentation

## 2012-06-15 DIAGNOSIS — K648 Other hemorrhoids: Secondary | ICD-10-CM | POA: Insufficient documentation

## 2012-06-15 DIAGNOSIS — D126 Benign neoplasm of colon, unspecified: Secondary | ICD-10-CM

## 2012-06-15 DIAGNOSIS — K2990 Gastroduodenitis, unspecified, without bleeding: Secondary | ICD-10-CM

## 2012-06-15 DIAGNOSIS — R1013 Epigastric pain: Secondary | ICD-10-CM | POA: Insufficient documentation

## 2012-06-15 DIAGNOSIS — K3189 Other diseases of stomach and duodenum: Secondary | ICD-10-CM | POA: Insufficient documentation

## 2012-06-15 DIAGNOSIS — Z01812 Encounter for preprocedural laboratory examination: Secondary | ICD-10-CM | POA: Insufficient documentation

## 2012-06-15 DIAGNOSIS — E119 Type 2 diabetes mellitus without complications: Secondary | ICD-10-CM | POA: Insufficient documentation

## 2012-06-15 DIAGNOSIS — Z01810 Encounter for preprocedural cardiovascular examination: Secondary | ICD-10-CM | POA: Insufficient documentation

## 2012-06-15 HISTORY — PX: POLYPECTOMY: SHX5525

## 2012-06-15 HISTORY — PX: BIOPSY: SHX5522

## 2012-06-15 SURGERY — COLONOSCOPY WITH PROPOFOL
Anesthesia: Monitor Anesthesia Care

## 2012-06-15 MED ORDER — WATER FOR IRRIGATION, STERILE IR SOLN
Status: DC | PRN
  Administered 2012-06-15: 1000 mL

## 2012-06-15 MED ORDER — MIDAZOLAM HCL 2 MG/2ML IJ SOLN
INTRAMUSCULAR | Status: AC
  Filled 2012-06-15: qty 2

## 2012-06-15 MED ORDER — LIDOCAINE HCL 1 % IJ SOLN
INTRAMUSCULAR | Status: DC | PRN
  Administered 2012-06-15: 20 mg via INTRADERMAL

## 2012-06-15 MED ORDER — MIDAZOLAM HCL 2 MG/2ML IJ SOLN
1.0000 mg | INTRAMUSCULAR | Status: DC | PRN
  Administered 2012-06-15 (×2): 2 mg via INTRAVENOUS

## 2012-06-15 MED ORDER — GLYCOPYRROLATE 0.2 MG/ML IJ SOLN
INTRAMUSCULAR | Status: AC
  Filled 2012-06-15: qty 1

## 2012-06-15 MED ORDER — FENTANYL CITRATE 0.05 MG/ML IJ SOLN
INTRAMUSCULAR | Status: AC
  Filled 2012-06-15: qty 2

## 2012-06-15 MED ORDER — LIDOCAINE HCL (PF) 1 % IJ SOLN
INTRAMUSCULAR | Status: AC
  Filled 2012-06-15: qty 5

## 2012-06-15 MED ORDER — PROPOFOL 10 MG/ML IV EMUL
INTRAVENOUS | Status: AC
  Filled 2012-06-15: qty 20

## 2012-06-15 MED ORDER — PROPOFOL 10 MG/ML IV EMUL
INTRAVENOUS | Status: DC | PRN
  Administered 2012-06-15: 09:00:00 via INTRAVENOUS
  Administered 2012-06-15: 75 ug/kg/min via INTRAVENOUS

## 2012-06-15 MED ORDER — ONDANSETRON HCL 4 MG/2ML IJ SOLN
4.0000 mg | Freq: Once | INTRAMUSCULAR | Status: AC
  Administered 2012-06-15: 4 mg via INTRAVENOUS

## 2012-06-15 MED ORDER — FENTANYL CITRATE 0.05 MG/ML IJ SOLN
INTRAMUSCULAR | Status: DC | PRN
  Administered 2012-06-15: 50 ug via INTRAVENOUS

## 2012-06-15 MED ORDER — SIMETHICONE 40 MG/0.6ML PO SUSP
ORAL | Status: DC | PRN
  Administered 2012-06-15: 08:00:00

## 2012-06-15 MED ORDER — MIDAZOLAM HCL 5 MG/5ML IJ SOLN
INTRAMUSCULAR | Status: DC | PRN
  Administered 2012-06-15: 2 mg via INTRAVENOUS

## 2012-06-15 MED ORDER — FENTANYL CITRATE 0.05 MG/ML IJ SOLN: 25.0000 ug | INTRAMUSCULAR | Status: DC | PRN

## 2012-06-15 MED ORDER — LACTATED RINGERS IV SOLN
INTRAVENOUS | Status: DC
  Administered 2012-06-15: 08:00:00 via INTRAVENOUS

## 2012-06-15 MED ORDER — ONDANSETRON HCL 4 MG/2ML IJ SOLN: 4.0000 mg | Freq: Once | INTRAMUSCULAR | Status: DC | PRN

## 2012-06-15 MED ORDER — GLYCOPYRROLATE 0.2 MG/ML IJ SOLN
0.2000 mg | Freq: Once | INTRAMUSCULAR | Status: AC
  Administered 2012-06-15: 0.2 mg via INTRAVENOUS

## 2012-06-15 MED ORDER — BUTAMBEN-TETRACAINE-BENZOCAINE 2-2-14 % EX AERO
1.0000 | INHALATION_SPRAY | Freq: Once | CUTANEOUS | Status: AC
  Administered 2012-06-15: 1 via TOPICAL
  Filled 2012-06-15: qty 56

## 2012-06-15 MED ORDER — MINERAL OIL PO OIL
TOPICAL_OIL | ORAL | Status: AC
  Filled 2012-06-15: qty 30

## 2012-06-15 MED ORDER — ONDANSETRON HCL 4 MG/2ML IJ SOLN
INTRAMUSCULAR | Status: AC
  Filled 2012-06-15: qty 2

## 2012-06-15 SURGICAL SUPPLY — 22 items
BLOCK BITE 60FR ADLT L/F BLUE (MISCELLANEOUS) ×3 IMPLANT
ELECT REM PT RETURN 9FT ADLT (ELECTROSURGICAL) ×3
ELECTRODE REM PT RTRN 9FT ADLT (ELECTROSURGICAL) ×2 IMPLANT
FCP BXJMBJMB 240X2.8X (CUTTING FORCEPS)
FLOOR PAD 36X40 (MISCELLANEOUS) ×3
FORCEPS BIOP RAD 4 LRG CAP 4 (CUTTING FORCEPS) ×6 IMPLANT
FORCEPS BIOP RJ4 240 W/NDL (CUTTING FORCEPS)
FORCEPS BXJMBJMB 240X2.8X (CUTTING FORCEPS) IMPLANT
INJECTOR/SNARE I SNARE (MISCELLANEOUS) IMPLANT
LUBRICANT JELLY 4.5OZ STERILE (MISCELLANEOUS) ×3 IMPLANT
MANIFOLD NEPTUNE II (INSTRUMENTS) ×3 IMPLANT
NEEDLE SCLEROTHERAPY 25GX240 (NEEDLE) IMPLANT
PAD FLOOR 36X40 (MISCELLANEOUS) ×2 IMPLANT
PROBE APC STR FIRE (PROBE) IMPLANT
PROBE INJECTION GOLD (MISCELLANEOUS)
PROBE INJECTION GOLD 7FR (MISCELLANEOUS) IMPLANT
SNARE SHORT THROW 13M SML OVAL (MISCELLANEOUS) ×3 IMPLANT
SYR 50ML LL SCALE MARK (SYRINGE) ×3 IMPLANT
TRAP SPECIMEN MUCOUS 40CC (MISCELLANEOUS) ×3 IMPLANT
TUBING ENDO SMARTCAP PENTAX (MISCELLANEOUS) ×3 IMPLANT
TUBING IRRIGATION ENDOGATOR (MISCELLANEOUS) ×3 IMPLANT
WATER STERILE IRR 1000ML POUR (IV SOLUTION) ×6 IMPLANT

## 2012-06-15 NOTE — Op Note (Signed)
[facility] [address] [place], [address]   COLONOSCOPY PROCEDURE REPORT  PATIENT: Anthony Price, Anthony Price  MR#: [id number] BIRTHDATE: 12-01-1955 , 56  yrs. old GENDER: Male ENDOSCOPIST: Jonette Eva, MD REFERRED XB:JYNWGN Juanetta Gosling, M.D. PROCEDURE DATE:  06/15/2012 PROCEDURE:   Colonoscopy with biopsy and Colonoscopy with snare polypectomy INDICATIONS:average risk patient for colon cancer. ASA CLASSIFICATION: MEDICATIONS: MAC sedation, administered by CRNA  DESCRIPTION OF PROCEDURE:    Physical exam was performed.  Informed consent was obtained from the patient after explaining the benefits, risks, and alternatives to procedure.  The patient was connected to monitor and placed in left lateral position. Continuous oxygen was provided by nasal cannula and IV medicine administered through an indwelling cannula.  After administration of sedation and rectal exam, the patients rectum was intubated and the     colonoscope was advanced under direct visualization to the cecum.  The scope was removed slowly by carefully examining the color, texture, anatomy, and integrity mucosa on the way out.  The patient was recovered in endoscopy and discharged home in satisfactory condition.     1.  COLON FINDINGS: Multiple sessile polyps ranging between 3-75mm in size were found in the ascending colon.  A polypectomy was performed using snare cautery and with cold forceps. 2.  Polyp(s) measuring 6 mm in size were found.  A polypectomy was performed using snare cautery. 3.  Internal hemorrhoids were found.   PREP QUALITY: excellent. CECAL W/D TIME: 16 minutes  COMPLICATIONS: None  ENDOSCOPIC IMPRESSION: 1.   Multiple sessile polyps ranging between 3-71mm in size were found in the ascending colon; polypectomy was performed using snare cautery and with cold forceps 2.   Polyp(s) measuring 6 mm in size were found; polypectomy was performed using snare cautery 3.   Internal  hemorrhoids 4.   RANDOM BIOPSIES OBTAINED VIA COLD FORCEPS TO EVALUATE FOR MICROSCOPIC COLITIS   RECOMMENDATIONS: Await biopsy results LACTOSE FREE DIET OPV IN 3 MOS TCS IN 10 YEARS       _______________________________ eSignedJonette Eva, MD 06/15/2012 9:19 AM     PATIENT NAME:  Price, Anthony MR#: [id number]

## 2012-06-15 NOTE — Op Note (Signed)
[facility] [address] [place], [address]   ENDOSCOPY PROCEDURE REPORT  PATIENT: Anthony Price, Anthony Price  MR#: [id number] BIRTHDATE: April 20, 1956 , 56  yrs. old GENDER: Male ENDOSCOPIST: Jonette Eva, MD REFERRED XB:JYNWGN Juanetta Gosling, M.D. PROCEDURE DATE: 06/15/2012 PROCEDURE:   EGD w/ biopsy ASA CLASS: INDICATIONS:DYSPEPSIA & INTERMITTENT LOOSE STOOL ON IMMUNOSUPPRESSANT. MEDICATIONS: MAC sedation, administered by CRNA TOPICAL ANESTHETIC:   Cetacaine Spray  DESCRIPTION OF PROCEDURE:     Physical exam was performed.  Informed consent was obtained from the patient after explaining the benefits, risks, and alternatives to the procedure.  The patient was connected to the monitor and placed in the left lateral position.  Continuous oxygen was provided by nasal cannula and IV medicine administered through an indwelling cannula.  After administration of sedation, the patients esophagus was intubated and the     endoscope was advanced under direct visualization to the second portion of the duodenum.  The scope was removed slowly by carefully examining the color, texture, anatomy, and integrity of the mucosa on the way out.  The patient was recovered in endoscopy and discharged home in satisfactory condition.    MILD GASTRITIS NL ESOPHAGUS- NO BARRETT'S NL DUODENUM BIOPSIES OBTAINED IN THE GASTRIC AND DUODENAL MUCOSA TO Evaluate FOR H PYLORI AND CELIAC SPRUE. COMPLICATIONS:   None  ENDOSCOPIC IMPRESSION: MILD GASTRITIS NL ESOPHAGUS- NO BARRETT'S NL DUODENUM BIOPSIES OBTAINED IN THE GASTRIC AND DUODENAL MUCOSA TO Evaluate FOR H PYLORI AND CELIAC SPRUE  RECOMMENDATIONS: Await biopsy results PRILOSEC BID LACTOSE FREE HIGH FIBER DIET HOLD ASA. RESTART 8/28 OPV IN 3 MOS   REPEAT EXAM:   _______________________________ Rosalie DoctorJonette Eva, MD 06/15/2012 9:26 AM

## 2012-06-15 NOTE — Progress Notes (Signed)
Awake. Wants to get up. Swallowing without difficulty. Denies pain.

## 2012-06-15 NOTE — Anesthesia Postprocedure Evaluation (Signed)
  Anesthesia Post-op Note  Patient: Anthony Price  Procedure(s) Performed: Procedure(s) (LRB): COLONOSCOPY WITH PROPOFOL (N/A) ESOPHAGOGASTRODUODENOSCOPY (EGD) WITH PROPOFOL (N/A) BIOPSY (N/A) POLYPECTOMY ()  Patient Location: PACU  Anesthesia Type: MAC  Level of Consciousness: awake, alert  and oriented  Airway and Oxygen Therapy: Patient Spontanous Breathing  Post-op Pain: none  Post-op Assessment: Post-op Vital signs reviewed, Patient's Cardiovascular Status Stable, Respiratory Function Stable, Patent Airway and No signs of Nausea or vomiting  Post-op Vital Signs: Reviewed and stable  Complications: No apparent anesthesia complications

## 2012-06-15 NOTE — Progress Notes (Signed)
Awake. Denies pain. Swallowing without difficulty. Encouraged to pass air if uncomfortable.
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
## 2012-06-15 NOTE — Transfer of Care (Signed)
Immediate Anesthesia Transfer of Care Note  Patient: Anthony Price  Procedure(s) Performed: Procedure(s) (LRB): COLONOSCOPY WITH PROPOFOL (N/A) ESOPHAGOGASTRODUODENOSCOPY (EGD) WITH PROPOFOL (N/A) BIOPSY (N/A) POLYPECTOMY ()  Patient Location: PACU  Anesthesia Type: MAC  Level of Consciousness: awake, alert  and oriented  Airway & Oxygen Therapy: Patient Spontanous Breathing  Post-op Assessment: Report given to PACU RN  Post vital signs: Reviewed and stable  Complications: No apparent anesthesia complications

## 2012-06-15 NOTE — Anesthesia Preprocedure Evaluation (Signed)
Anesthesia Evaluation  Patient identified by MRN, date of birth, ID band Patient awake    Reviewed: Allergy & Precautions, H&P , NPO status , Patient's Chart, lab work & pertinent test results  History of Anesthesia Complications (+) PONV  Airway Mallampati: I      Dental  (+) Edentulous Upper and Edentulous Lower   Pulmonary former smoker,  breath sounds clear to auscultation        Cardiovascular Exercise Tolerance: Good hypertension, + Past MI Rhythm:Regular Rate:Normal  Heart Transplant 2000,    Neuro/Psych PSYCHIATRIC DISORDERS Anxiety Depression    GI/Hepatic GERD-  Medicated,  Endo/Other  Well Controlled, Type 2, Oral Hypoglycemic Agents  Renal/GU      Musculoskeletal   Abdominal   Peds  Hematology   Anesthesia Other Findings   Reproductive/Obstetrics                           Anesthesia Physical Anesthesia Plan  ASA: III  Anesthesia Plan: MAC   Post-op Pain Management:    Induction: Intravenous  Airway Management Planned: Nasal Cannula  Additional Equipment:   Intra-op Plan:   Post-operative Plan:   Informed Consent: I have reviewed the patients History and Physical, chart, labs and discussed the procedure including the risks, benefits and alternatives for the proposed anesthesia with the patient or authorized representative who has indicated his/her understanding and acceptance.     Plan Discussed with:   Anesthesia Plan Comments:         Anesthesia Quick Evaluation

## 2012-06-17 ENCOUNTER — Encounter (HOSPITAL_COMMUNITY): Payer: Self-pay | Admitting: Gastroenterology

## 2012-06-21 ENCOUNTER — Telehealth: Payer: Self-pay | Admitting: Gastroenterology

## 2012-06-21 NOTE — Progress Notes (Signed)
EGD/TCS AUG 2013: SIMPLE ADENOMA O/W NL COLON, STOMACH, DUODENUM  REVIEWED.

## 2012-06-21 NOTE — Telephone Encounter (Addendum)
Please call pt. He had simple adenomas removed from hIS colon. HE DOES NOT HAVE H PYLORI. HIS SYMPTOMS ARE MOST LIKELY DUE TO DAIRY PRODUCTS.   CONTINUE OMEPRAZOLE.  RESTART ASA 8/28.  FOLLOW A HIGH FIBER/dairy free DIET. AVOID ITEMS THAT CAUSE BLOATING.   USE LACTASE PILLS IF YOU EAT DIARY PRODUCTS.  Follow up in 3 mos.  Next colonoscopy in AUG 2016.

## 2012-06-22 NOTE — Telephone Encounter (Signed)
Recall made 

## 2012-06-22 NOTE — Telephone Encounter (Signed)
Called and informed pt.  

## 2012-08-31 ENCOUNTER — Ambulatory Visit (INDEPENDENT_AMBULATORY_CARE_PROVIDER_SITE_OTHER): Payer: 59 | Admitting: Psychiatry

## 2012-08-31 ENCOUNTER — Encounter (HOSPITAL_COMMUNITY): Payer: Self-pay | Admitting: Psychiatry

## 2012-08-31 ENCOUNTER — Ambulatory Visit (HOSPITAL_COMMUNITY): Payer: Self-pay | Admitting: Psychiatry

## 2012-08-31 VITALS — Ht 67.75 in | Wt 160.2 lb

## 2012-08-31 DIAGNOSIS — F32A Depression, unspecified: Secondary | ICD-10-CM

## 2012-08-31 DIAGNOSIS — F329 Major depressive disorder, single episode, unspecified: Secondary | ICD-10-CM

## 2012-08-31 MED ORDER — BUPROPION HCL ER (XL) 300 MG PO TB24: 300.0000 mg | ORAL_TABLET | ORAL | Status: DC

## 2012-08-31 NOTE — Patient Instructions (Addendum)
Inderal could be an option for better control of your anxeity.  Perhaps using texting can help you stay calmer when communicating with the transplant team.   We are still looking for our best luck.

## 2012-08-31 NOTE — Progress Notes (Signed)
Chief complaint Medication management and followup.  History of present illness Patient's 56 year old Caucasian male who came for his followup appointment with his girlfriend. He's been compliant with his medication and reported no side effects.  He says that he will not stop his Xanax or he will go some where else.  He reports good days and bad days.  The Wellbutrin is working pretty well.  Discussed with him about increasing the Wellbutrin to 450 mg.  They were content with the current dose. He no longer is having thoughts of wishing he were not here.  The anxiety of getting upset around people is still a problem.  Offered information about Uti's bread which is lactose free.  Also offered information about Inderal for anxiety management.  He notes a knot in his stomach when he gets anxious in the community.    Current psychiatric medication Wellbutrin XL 300 mg daily continuie Xanax 1 mg up to 3 times a day prescribed by his primary care physician.  Medical history Hyperlipidemia, high blood pressure, and chronic back pain, ankle surgery and heart transplant   Mental status examination Patient is casually dressed and groomed.  He is pleasant , calm and cooperative.  He maintained fair  eye contact.  His speech is soft clear and coherent. He denies any active or passive suicidal thoughts or homicidal thoughts. There are no psychotic symptoms present. He denies any auditory or visual hallucination. His attention and concentration is improved. He described his mood is okay and his affect is mood congruent.  He's alert and oriented x3. His insight judgment and impulse control is okay.  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 Discussed the option of adding Inderal for his anxiety.  He will consult his cardiologist  about that.  Memory loss noted with continued use of Xanax.

## 2012-09-15 ENCOUNTER — Encounter: Payer: Self-pay | Admitting: Gastroenterology

## 2012-09-15 ENCOUNTER — Ambulatory Visit (INDEPENDENT_AMBULATORY_CARE_PROVIDER_SITE_OTHER): Payer: 59 | Admitting: Gastroenterology

## 2012-09-15 VITALS — BP 145/91 | HR 96 | Temp 97.0°F | Ht 68.0 in | Wt 158.4 lb

## 2012-09-15 DIAGNOSIS — K6389 Other specified diseases of intestine: Secondary | ICD-10-CM | POA: Insufficient documentation

## 2012-09-15 DIAGNOSIS — D369 Benign neoplasm, unspecified site: Secondary | ICD-10-CM | POA: Insufficient documentation

## 2012-09-15 DIAGNOSIS — K219 Gastro-esophageal reflux disease without esophagitis: Secondary | ICD-10-CM

## 2012-09-15 NOTE — Patient Instructions (Addendum)
Continue Prilosec twice a day.   Continue taking a probiotic daily and avoiding dairy products.   We will see you back in 1 year or sooner if needed.  Have a wonderful Holiday Season!

## 2012-09-15 NOTE — Assessment & Plan Note (Signed)
Improved. Continue Omeprazole BID. EGD on file, no H.pylori or Barrett's. Return in 1 year.
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
## 2012-09-15 NOTE — Progress Notes (Signed)
Faxed to PCP

## 2012-09-15 NOTE — Progress Notes (Signed)
REVIEWED.  E30 SLF IN 1 YEARS

## 2012-09-15 NOTE — Assessment & Plan Note (Signed)
Per SLF notes, due for surveillance in 10 years. Loose stools and bloating resolved with cessation of dairy products. Continue probiotic and avoidance of dairy. 1 year f/u.

## 2012-09-21 NOTE — Progress Notes (Signed)
Reminder in epic to follow up in 1 year with SF in E30

## 2012-11-12 ENCOUNTER — Other Ambulatory Visit (HOSPITAL_COMMUNITY): Payer: Self-pay | Admitting: Pulmonary Disease

## 2012-11-12 ENCOUNTER — Ambulatory Visit (HOSPITAL_COMMUNITY)
Admission: RE | Admit: 2012-11-12 | Discharge: 2012-11-12 | Disposition: A | Discharge status: Home / Self Care | Payer: Medicaid Other | Source: Ambulatory Visit | Attending: Pulmonary Disease | Admitting: Pulmonary Disease

## 2012-11-12 DIAGNOSIS — R079 Chest pain, unspecified: Secondary | ICD-10-CM | POA: Insufficient documentation

## 2012-11-12 DIAGNOSIS — R0789 Other chest pain: Secondary | ICD-10-CM

## 2012-12-01 ENCOUNTER — Ambulatory Visit (INDEPENDENT_AMBULATORY_CARE_PROVIDER_SITE_OTHER): Payer: 59 | Admitting: Psychiatry

## 2012-12-01 ENCOUNTER — Encounter (HOSPITAL_COMMUNITY): Payer: Self-pay | Admitting: Psychiatry

## 2012-12-01 VITALS — Wt 161.0 lb

## 2012-12-01 DIAGNOSIS — F32A Depression, unspecified: Secondary | ICD-10-CM

## 2012-12-01 DIAGNOSIS — F329 Major depressive disorder, single episode, unspecified: Secondary | ICD-10-CM

## 2012-12-01 MED ORDER — BUPROPION HCL ER (XL) 300 MG PO TB24: 300.0000 mg | ORAL_TABLET | ORAL | Status: DC

## 2012-12-01 NOTE — Progress Notes (Signed)
[facility] [address] Progress Note Anthony Price MRN: [id number] DOB: [birth year] Age: 57 y.o.  Date: 12/01/2012 Start Time: 11:35 AM End Time: 11:50 AM  Chief Complaint: Chief Complaint  Patient presents with  . Depression  . Follow-up  . Medication Refill   Subjective: "I'm not where I was after they changed some of my medicines for the transplant rejection". Depression unable to rate/10 and Anxiety 6 to 7/10, where 1 is the best and 10 is the worst. Pain is 0/10 at this moment, but gets up to 10/10 with a lot of physical work.  History of present illness Patient's 57 year old Caucasian male who came for his followup appointment with his girlfriend. Pt reports that he is compliant with the psychotropic medications with good benefit of his anxiety and no noticeable side effects.  He feels that he is doing worse with the recent change in his transplant rejection medications.  The girlfriend reports that the Inderal would not do well with the transplant medications he is on.    Current psychiatric medication Wellbutrin XL 300 mg daily continuie Xanax 1 mg up to 3 times a day prescribed by his primary care physician.  Medical history Hyperlipidemia, high blood pressure, and chronic back pain, ankle surgery and heart transplant   Family History family history includes Anxiety disorder in his cousins; Dementia in his maternal uncle; and Suicidality in his cousin.  There is no history of Colon cancer, and ADD / ADHD, and Alcohol abuse, and Drug abuse, and Bipolar disorder, and Depression, and OCD, and Paranoid behavior, and Schizophrenia, and Seizures, and Sexual abuse, and Physical abuse, .  Mental status examination Patient is casually dressed and groomed.  He is pleasant , calm and cooperative.  He maintained fair  eye contact.  His speech is soft clear and coherent. He denies any active or passive suicidal thoughts or homicidal thoughts. There are no psychotic symptoms  present. He denies any auditory or visual hallucination. His attention and concentration is improved. He described his mood is okay and his affect is mood congruent.  He's alert and oriented x3. His insight judgment and impulse control is okay.  Lab Results:  Recent Results (from the past 8736 hour(s))  BASIC METABOLIC PANEL   Collection Time   06/08/12 10:18 AM      Component Value Range   Sodium 139  135 - 145 mEq/L   Potassium 4.0  3.5 - 5.1 mEq/L   Chloride 102  96 - 112 mEq/L   CO2 26  19 - 32 mEq/L   Glucose, Bld 213 (*) 70 - 99 mg/dL   BUN 10  6 - 23 mg/dL   Creatinine, Ser 1.91  0.50 - 1.35 mg/dL   Calcium 9.2  8.4 - 47.8 mg/dL   GFR calc non Af Amer >90  >90 mL/min   GFR calc Af Amer >90  >90 mL/min  HEMOGLOBIN AND HEMATOCRIT, BLOOD   Collection Time   06/08/12 10:18 AM      Component Value Range   Hemoglobin 16.0  13.0 - 17.0 g/dL   HCT 29.5  62.1 - 30.8 %  GLUCOSE, CAPILLARY   Collection Time   06/15/12  6:29 AM      Component Value Range   Glucose-Capillary 167 (*) 70 - 99 mg/dL   Comment 1 Notify RN    The transplant team and his family doctor follow his blood work and reportedly all is well.  Assessment Axis I Depressive disorder NOS, depressive disorder  due to general medical condition Axis II deferred Axis III see medical history Axis IV mild to moderate  Plan: I took his vitals.  I reviewed CC, tobacco/med/surg Hx, meds effects/ side effects, problem list, therapies and responses as well as current situation/symptoms discussed options. See orders and pt instructions for more details.  Medical Decision Making Problem Points:  Established problem, stable/improving (1), Review of last therapy session (1) and Review of psycho-social stressors (1) Data Points:  Review or order clinical lab tests (1) Review of medication regiment & side effects (2)  I certify that outpatient services furnished can reasonably be expected to improve the patient's condition.    Orson Aloe, MD, [facility]

## 2012-12-01 NOTE — Patient Instructions (Signed)
Call if problems or concerns.  

## 2013-02-14 ENCOUNTER — Encounter (HOSPITAL_COMMUNITY): Payer: Self-pay | Admitting: *Deleted

## 2013-02-28 ENCOUNTER — Encounter (HOSPITAL_COMMUNITY): Payer: Self-pay | Admitting: Psychiatry

## 2013-02-28 ENCOUNTER — Ambulatory Visit (INDEPENDENT_AMBULATORY_CARE_PROVIDER_SITE_OTHER): Payer: 59 | Admitting: Psychiatry

## 2013-02-28 VITALS — BP 127/86 | Ht 67.25 in | Wt 163.0 lb

## 2013-02-28 DIAGNOSIS — F329 Major depressive disorder, single episode, unspecified: Secondary | ICD-10-CM

## 2013-02-28 DIAGNOSIS — F418 Other specified anxiety disorders: Secondary | ICD-10-CM

## 2013-02-28 DIAGNOSIS — F32A Depression, unspecified: Secondary | ICD-10-CM

## 2013-02-28 MED ORDER — BUPROPION HCL ER (XL) 300 MG PO TB24: 300.0000 mg | ORAL_TABLET | ORAL | Status: DC
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
## 2013-02-28 NOTE — Patient Instructions (Addendum)
Set a timer for 8 or a certain number minutes and walk for that amount of time in the house or in the yard.  Mark the number of minutes on a calendar for that day.  Do that every day this week.  Then next week increase the time by 1 minutes and then mark the calendar with the number of minutes for that day.  Each week increase your exercise by one minute.  Keep a record of this so you can see the progress you are making.  Do this every day, just like eating and sleeping.  It is good for pain control, depression, and for your soul/spirit.  Bring the record in for your next visit so we can talk about your effort and how you feel with the new exercise program going and working for you.  Relaxation is the ultimate solution for you.  You can seek it through tub baths, bubble baths, essential oils or incense, walking or chatting with friends, listening to soft music, watching a candle burn and just letting all thoughts go and appreciating the true essence of the Creator.  Pets or animals may be very helpful.  You might spend some time with them and then go do more directed meditation.  "I am Wishes Fulfilled Meditation" by Wayne W Dyer and James F Twyman may be helpful MUSIC for getting to sleep or for meditating You can order it from on line.  You might find the Chill channel on Pandora and explore the artists that you like better.   Take care of yourself.  No one else is standing up to do the job and only you know what you need.   GET SERIOUS about taking care of yourself.  Do the next right thing and that often means doing something to care for yourself along the lines of are you hungry, are you angry, are you lonely, are you tired, are you scared?  [employer] is what that stands for.  Call if problems or concerns. 

## 2013-03-27 DIAGNOSIS — A0472 Enterocolitis due to Clostridium difficile, not specified as recurrent: Secondary | ICD-10-CM | POA: Insufficient documentation

## 2013-03-27 HISTORY — DX: Enterocolitis due to Clostridium difficile, not specified as recurrent: A04.72

## 2013-03-30 ENCOUNTER — Emergency Department (HOSPITAL_COMMUNITY)
Admission: EM | Admit: 2013-03-30 | Discharge: 2013-03-30 | Disposition: A | Discharge status: Home / Self Care | Payer: Medicaid Other | Attending: Emergency Medicine | Admitting: Emergency Medicine

## 2013-03-30 ENCOUNTER — Encounter (HOSPITAL_COMMUNITY): Payer: Self-pay | Admitting: Emergency Medicine

## 2013-03-30 ENCOUNTER — Emergency Department (HOSPITAL_COMMUNITY): Payer: Medicaid Other

## 2013-03-30 DIAGNOSIS — F411 Generalized anxiety disorder: Secondary | ICD-10-CM | POA: Insufficient documentation

## 2013-03-30 DIAGNOSIS — F329 Major depressive disorder, single episode, unspecified: Secondary | ICD-10-CM | POA: Insufficient documentation

## 2013-03-30 DIAGNOSIS — Z87891 Personal history of nicotine dependence: Secondary | ICD-10-CM | POA: Insufficient documentation

## 2013-03-30 DIAGNOSIS — R197 Diarrhea, unspecified: Secondary | ICD-10-CM | POA: Insufficient documentation

## 2013-03-30 DIAGNOSIS — K529 Noninfective gastroenteritis and colitis, unspecified: Secondary | ICD-10-CM

## 2013-03-30 DIAGNOSIS — K5289 Other specified noninfective gastroenteritis and colitis: Secondary | ICD-10-CM | POA: Insufficient documentation

## 2013-03-30 DIAGNOSIS — F3289 Other specified depressive episodes: Secondary | ICD-10-CM | POA: Insufficient documentation

## 2013-03-30 DIAGNOSIS — E119 Type 2 diabetes mellitus without complications: Secondary | ICD-10-CM | POA: Insufficient documentation

## 2013-03-30 DIAGNOSIS — I252 Old myocardial infarction: Secondary | ICD-10-CM | POA: Insufficient documentation

## 2013-03-30 DIAGNOSIS — I10 Essential (primary) hypertension: Secondary | ICD-10-CM | POA: Insufficient documentation

## 2013-03-30 DIAGNOSIS — R143 Flatulence: Secondary | ICD-10-CM | POA: Insufficient documentation

## 2013-03-30 DIAGNOSIS — R141 Gas pain: Secondary | ICD-10-CM | POA: Insufficient documentation

## 2013-03-30 DIAGNOSIS — Z949 Transplanted organ and tissue status, unspecified: Secondary | ICD-10-CM | POA: Insufficient documentation

## 2013-03-30 DIAGNOSIS — K219 Gastro-esophageal reflux disease without esophagitis: Secondary | ICD-10-CM | POA: Insufficient documentation

## 2013-03-30 DIAGNOSIS — R142 Eructation: Secondary | ICD-10-CM | POA: Insufficient documentation

## 2013-03-30 DIAGNOSIS — E7800 Pure hypercholesterolemia, unspecified: Secondary | ICD-10-CM | POA: Insufficient documentation

## 2013-03-30 DIAGNOSIS — Z79899 Other long term (current) drug therapy: Secondary | ICD-10-CM | POA: Insufficient documentation

## 2013-03-30 LAB — CBC WITH DIFFERENTIAL/PLATELET
Basophils Absolute: 0 10*3/uL (ref 0.0–0.1)
HCT: 48.8 % (ref 39.0–52.0)
Hemoglobin: 16.7 g/dL (ref 13.0–17.0)
Lymphocytes Relative: 41 % (ref 12–46)
Monocytes Absolute: 0.7 10*3/uL (ref 0.1–1.0)
Neutro Abs: 4.1 10*3/uL (ref 1.7–7.7)
RBC: 5.66 MIL/uL (ref 4.22–5.81)
RDW: 13.1 % (ref 11.5–15.5)
WBC: 8.3 10*3/uL (ref 4.0–10.5)

## 2013-03-30 LAB — COMPREHENSIVE METABOLIC PANEL
ALT: 29 U/L (ref 0–53)
AST: 27 U/L (ref 0–37)
CO2: 21 mEq/L (ref 19–32)
Chloride: 101 mEq/L (ref 96–112)
Creatinine, Ser: 1.5 mg/dL — ABNORMAL HIGH (ref 0.50–1.35)
GFR calc non Af Amer: 51 mL/min — ABNORMAL LOW (ref >90)
Total Bilirubin: 0.6 mg/dL (ref 0.3–1.2)

## 2013-03-30 MED ORDER — SODIUM CHLORIDE 0.9 % IV BOLUS (SEPSIS)
1000.0000 mL | Freq: Once | INTRAVENOUS | Status: AC
  Administered 2013-03-30: 1000 mL via INTRAVENOUS

## 2013-03-30 MED ORDER — DICYCLOMINE HCL 20 MG PO TABS: ORAL_TABLET | ORAL | Status: DC

## 2013-03-30 NOTE — ED Notes (Signed)
Pt alert & oriented x4, stable gait. Patient given discharge instructions, paperwork & prescription(s). Patient  instructed to stop at the registration desk to finish any additional paperwork. Patient verbalized understanding. Pt left department w/ no further questions. 
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
## 2013-03-30 NOTE — ED Notes (Addendum)
Pt c/o nausea and diarrhea x 13 days. Pt states he has lost 13 lbs in 13 days.

## 2013-03-30 NOTE — ED Notes (Signed)
Pt reports abdominal pain and nausea that comes and goes, denies emesis, had diarrhea x3 today, watery stools. Pt reports 13 days of diarrhea and loss of 13 lbs since start of diarrhea.  Oral mucosa moist and pink, skin turgor not representative of dehydration, pt states able to keep fluids down.

## 2013-04-11 ENCOUNTER — Ambulatory Visit (INDEPENDENT_AMBULATORY_CARE_PROVIDER_SITE_OTHER): Payer: Medicaid Other | Admitting: Gastroenterology

## 2013-04-11 ENCOUNTER — Encounter: Payer: Self-pay | Admitting: Gastroenterology

## 2013-04-11 VITALS — BP 126/82 | HR 96 | Temp 97.8°F | Ht 68.0 in | Wt 154.2 lb

## 2013-04-11 DIAGNOSIS — R197 Diarrhea, unspecified: Secondary | ICD-10-CM

## 2013-04-11 MED ORDER — DICYCLOMINE HCL 10 MG PO CAPS: 10.0000 mg | ORAL_CAPSULE | Freq: Three times a day (TID) | ORAL | Status: DC
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
## 2013-04-11 NOTE — Patient Instructions (Addendum)
I have sent a prescription for Bentyl to your pharmacy. Take this with meals and at bedtime, not to exceed 4 capsules per day.   Please complete the stool samples. We will be in touch shortly.  Follow the low-residue diet in the meantime for the next week.   Low-Fiber Diet Fiber is found in fruits, vegetables, and grains. A low-fiber diet restricts fibrous foods that are not digested in the small intestine. A diet containing about 10 grams of fiber is considered low fiber.  PURPOSE  To prevent blockage of a partially obstructed or narrowed gastrointestinal tract.  To reduce fecal weight and volume.  To slow the movement of feces. WHEN IS THIS DIET USED?  It may be used during the acute phase of Crohn disease, ulcerative colitis, regional enteritis, or diverticulitis.  It may be used if your intestinal or esophageal tubes are narrowing (stenosis).  It may be used as a transitional diet following surgery, injury (trauma), or illness. CHOOSING FOODS Check labels, especially on foods from the starch list. Often times, dietary fiber content is listed on the nutrition facts panel. Please ask your Registered Dietitian if you have questions about specific foods that are related to your condition, especially if the food is not listed on this handout. Breads and Starches  Allowed: White, [place], and pita breads, plain rolls, buns, or sweet rolls, doughnuts, waffles, pancakes, bagels. Plain muffins, biscuits, matzoth. Soda, saltine, graham crackers. Pretzels, rusks, melba toast, zwieback. Cooked cereals: cornmeal, farina, or cream cereals. Dry cereals: refined corn, wheat, rice, and oat cereals (check label). Potatoes prepared any way without skins, refined macaroni, spaghetti, noodles, refined rice.  Avoid: Whole-wheat bread, rolls, and crackers. Multigrains, rye, bran seeds, nuts, or coconut. Cereals containing whole grains, multigrains, bran, coconut, nuts, raisins. Cooked or dry oatmeal. Coarse  wheat cereals, granola. Cereals advertised as "high fiber." Potato skins. Whole-grain pasta, wild or brown rice. Popcorn. Vegetables  Allowed: Strained tomato and vegetable juices. Fresh lettuce, cucumber, spinach. Well-cooked or canned: asparagus, bean sprouts, broccoli, cut green beans, cauliflower, pumpkin, beets, mushrooms, olives, yellow squash, tomato, tomato sauce, zucchini, turnips.Keep servings limited to  cup.  Avoid: Fresh, cooked, or canned: artichokes, baked beans, beet greens, Brussels sprouts, corn, kale, legumes, peas, sweet potatoes. Avoid large servings of any vegetables. Fruit  Allowed: All fruit juices except prune juice. Cooked or canned fruits without skin and seeds: apricots, applesauce, cantaloupe, cherries, grapefruit, grapes, kiwi, mandarin oranges, peaches, pears, fruit cocktail, pineapple, plums, watermelon. Fresh without skin: banana, grapes, cantaloupe, avocado, cherries, pineapple, kiwi, nectarines, peaches, blueberries. Keep servings limited to  cup or 1 piece.  Avoid: Fresh: apples with or without skin, apricots, mangoes, pears, raspberries, strawberries. Prune juice and juices with pulp, stewed or dried prunes. Dried fruits, raisins, dates. Avoid large servings of all fresh fruits. Meat and Protein Substitutes  Allowed: Ground or well-cooked tender beef, ham, veal, lamb, pork, poultry. Eggs, plain cheese. Fish, oysters, shrimp, lobster, other seafood. Liver, organ meats. Smooth nut butters.  Avoid: Tough, fibrous meats with gristle. Chunky nut butter.Cheese with seeds, nuts, or other foods not allowed. Nuts, seeds, legumes, dried peas, beans, lentils. Dairy  Allowed: All milk products except those not allowed.  Avoid: Yogurt or cheese that contains nuts, seeds, or added fruit. Soups and Combination Foods  Allowed: Bouillon, broth, or cream soups made from allowed foods. Any strained soup. Casseroles or mixed dishes made with allowed foods.  Avoid: Soups  made from vegetables that are not allowed or that contain other foods  not allowed. Desserts and Sweets  Allowed:Plain cakes and cookies, pie made with allowed fruit, pudding, custard, cream pie. Gelatin, fruit, ice, sherbet, frozen ice pops. Ice cream, ice milk without nuts. Plain hard candy, honey, jelly, molasses, syrup, sugar, chocolate syrup, gumdrops, marshmallows.  Avoid: Desserts, cookies, or candies that contain nuts, peanut butter, dried fruits. Jams, preserves with seeds, marmalade. Fats and Oils  Allowed:Margarine, butter, cream, mayonnaise, salad oils, plain salad dressings made from allowed foods.  Avoid: Seeds, nuts, olives. Beverages  Allowed: All, except those listed to avoid.  Avoid: Fruit juices with high pulp, prune juice. Condiments  Allowed:Ketchup, mustard, horseradish, vinegar, cream sauce, cheese sauce, cocoa powder. Spices in moderation: allspice, basil, bay leaves, celery powder or leaves, cinnamon, cumin powder, curry powder, ginger, mace, marjoram, onion or garlic powder, oregano, paprika, parsley flakes, ground pepper, rosemary, sage, savory, tarragon, thyme, turmeric.  Avoid: Coconut, pickles. SAMPLE MENU Breakfast   cup orange juice.  1 boiled egg.  1 slice white toast.  Margarine.   cup cornflakes.  1 cup milk.  Beverage. Lunch   cup chicken noodle soup.  2 to 3 oz sliced roast beef.  2 slices white bread.  Mayonnaise.   cup tomato juice.  1 small banana.  Beverage. Dinner  3 oz baked chicken.   cup scalloped potatoes.   cup cooked beets.  White dinner roll.  Margarine.   cup canned peaches.  Beverage. Document Released: 04/04/2002 Document Revised: 01/05/2012 Document Reviewed: 10/30/2011 [facility] Patient Information 2014 [place], [place].

## 2013-04-13 ENCOUNTER — Telehealth: Payer: Self-pay

## 2013-04-13 MED ORDER — VANCOMYCIN HCL 125 MG PO CAPS: 125.0000 mg | ORAL_CAPSULE | Freq: Four times a day (QID) | ORAL | Status: DC

## 2013-04-13 NOTE — Assessment & Plan Note (Signed)
57 year old male presenting with acute on chronic diarrhea, abdominal cramping, mild weight loss. Encouragingly, TCS/EGD up to date as of Aug 2013. Exposure to abx noted, with concern for infectious process. Other differentials include post-infection IBS. Weight loss likely secondary to decreased po intake, as he notes postprandial loose stools and concern for eating due to worsening of diarrhea. Improvement noted with Bentyl, prescribed by ED>   Obtain stool studies Bentyl 10 mg TID and at bedtime Follow low-residue diet for now

## 2013-04-13 NOTE — Telephone Encounter (Signed)
Discussed with Dr. Jena Gauss. As he is transplant patient on Cellcept, we will give vancomycin. RX sent to pharmacy. Patient needs to start medication ASAP.   He should call if no improvement over next 48 hours.

## 2013-04-13 NOTE — Progress Notes (Signed)
Quick Note:  I called Harrold Donath at [facility] and asked him not to fill the Flagyl if Tobi Bastos sends in, because Verlon Au has discussed with Dr. Jena Gauss and the Vancomycin was recommended. He said he would flag it so it would not be filled. He has filled the Vanco and it is only $3.00. ______

## 2013-04-13 NOTE — Progress Notes (Signed)
Cc PCP 

## 2013-04-13 NOTE — Telephone Encounter (Signed)
Called pt and informed. Went over the C-Diff precautions. Pt will call if questions or problems with prescription.

## 2013-04-13 NOTE — Telephone Encounter (Deleted)
Flagyl sent to pharmacy

## 2013-04-13 NOTE — Telephone Encounter (Signed)
T/C from [place] at [facility], Reports a positive C-Diff for pt. This is a [facility] pt and Gerrit Halls, NP saw him on 04/11/2013. Routing to Tana Coast, PA in their absence.

## 2013-04-13 NOTE — Telephone Encounter (Signed)
Disregard prior notation regarding Flagyl on result note.

## 2013-04-13 NOTE — Progress Notes (Signed)
Quick Note:  Cdiff positive.  Please notify patient; needs Flagyl X 14 days.  Switch to Zantac while taking treatment Add probiotic Please review precautions with patient.  I am sending in abx if not already completed. ______

## 2013-04-14 ENCOUNTER — Telehealth: Payer: Self-pay

## 2013-04-14 NOTE — Telephone Encounter (Signed)
I would recommend Kaopectate only. No more than twice per day.  See other recommendations regarding switching PPI to Zantac only while on vancomycin.

## 2013-04-14 NOTE — Telephone Encounter (Signed)
Pt is aware.  

## 2013-04-14 NOTE — Telephone Encounter (Signed)
Pt's girl friend , Dory Horn, called and said that pt could not remember all of the info last evening about the C-Diff. Pt got on the phone and said it was ok to talk to Lupita Leash. I told Lupita Leash the results and went over the instructions for C-Diff. They do want to know is it still safe to take the Kaopectate and Dicyclomine with the Vancomycin. ( Pt started his medication last evening).

## 2013-04-16 LAB — STOOL CULTURE

## 2013-04-19 NOTE — Progress Notes (Signed)
Quick Note:  Noted. Thank you. I didn't send in, because I saw Verlon Au was working on this. Appreciate all the help! ______

## 2013-05-19 ENCOUNTER — Ambulatory Visit (INDEPENDENT_AMBULATORY_CARE_PROVIDER_SITE_OTHER): Payer: Medicaid Other | Admitting: Gastroenterology

## 2013-05-19 ENCOUNTER — Encounter: Payer: Self-pay | Admitting: Gastroenterology

## 2013-05-19 VITALS — BP 135/88 | HR 114 | Temp 97.3°F | Ht 68.0 in | Wt 149.4 lb

## 2013-05-19 DIAGNOSIS — A0472 Enterocolitis due to Clostridium difficile, not specified as recurrent: Secondary | ICD-10-CM

## 2013-05-19 DIAGNOSIS — R197 Diarrhea, unspecified: Secondary | ICD-10-CM

## 2013-05-19 NOTE — Progress Notes (Signed)
Cc PCP 
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
## 2013-05-19 NOTE — Assessment & Plan Note (Signed)
SX RE-OCCURRED IN IN IMMUNOCOMPROMISED PT  C DIFF PCR PROBIOTIC DAILY CONTINUE LOW RESIDUE DIET OPV IN 4 WEEKS IF C DIFF NEG, AND 8 WEEKS IF C DIFF POS CONSIDER PANCREATIC ENZYMES IF C DIFF NEG AND/OR INCREASE BENTYL TO 1-2 PPO QAC/HS

## 2013-05-19 NOTE — Patient Instructions (Signed)
SUBMIT STOOL SAMPLE ASAP.  CONTINUE A PROBIOTIC DAILY.  CONTINUE LOW RESIDUE/DAIRY FREE DIET.  FOLLOW UP IN  4 WEEKS IF YOUR C DIFF TEST IS NEG, AND 8 WEEKS IF YOUR C DIFF TEST IS POS.  WE MAY NEED TO INCREASE YOUR BENTYL AS WELL.

## 2013-05-24 ENCOUNTER — Telehealth: Payer: Self-pay

## 2013-05-24 NOTE — Progress Notes (Signed)
Called patient'S PH# TO DISCUSS RESULTS. LVM-CALL [phone] TO DISCUSS.

## 2013-05-24 NOTE — Telephone Encounter (Signed)
Pt called for stool results and I told him I have not received anything from Dr. Darrick Penna yet, and I checked and told him his C-Diff was negative. Told him I will see if Dr. Darrick Penna has any recommendations.

## 2013-05-24 NOTE — Telephone Encounter (Signed)
SEE OPV 7/24.

## 2013-05-25 NOTE — Progress Notes (Signed)
Pt returned call and he can be reached at [phone] today.

## 2013-05-26 ENCOUNTER — Encounter: Payer: Self-pay | Admitting: Gastroenterology

## 2013-05-26 MED ORDER — DICYCLOMINE HCL 10 MG PO CAPS: ORAL_CAPSULE | ORAL | Status: DC

## 2013-05-26 NOTE — Progress Notes (Signed)
Pt is aware of OV on 9/11 at 9 with SF E30 and appt card was mailed

## 2013-05-26 NOTE — Addendum Note (Signed)
Addended by: West Bali on: 05/26/2013 10:08 AM   Modules accepted: Orders

## 2013-05-26 NOTE — Progress Notes (Signed)
Called patient TO DISCUSS RESULTS.  

## 2013-05-26 NOTE — Progress Notes (Signed)
SINCE LAST VISIT MAY HAVE SOFT STOOLS. NOT HAVING WATERY STOOLS LIKE HE HAD WITH C DIFF.:2-3/DAY. PRILOSEC ON HOLD. MAY BE RELATED TO EATING.  PLAN: 1. INCREASE BENTYL TO 10 MG(2) PO QAC AND IF NEEDED QHS 2. CONTINUE PHILLIP'S COLON HEALTH 3. MAY RESTART PRILOSEC 4. CALL IN 2 WEEKS WITH AN UPDATE 5. OPV IN 6 WEEKS E30 DIARRHEA

## 2013-05-27 NOTE — Progress Notes (Signed)
Pt is aware of OV on 9/11 at 9 with SF

## 2013-05-30 ENCOUNTER — Ambulatory Visit (HOSPITAL_COMMUNITY): Payer: Self-pay | Admitting: Psychiatry

## 2013-06-02 ENCOUNTER — Encounter (HOSPITAL_COMMUNITY): Payer: Self-pay | Admitting: Psychiatry

## 2013-06-02 ENCOUNTER — Ambulatory Visit (INDEPENDENT_AMBULATORY_CARE_PROVIDER_SITE_OTHER): Payer: MEDICAID | Admitting: Psychiatry

## 2013-06-02 DIAGNOSIS — F32A Depression, unspecified: Secondary | ICD-10-CM

## 2013-06-02 DIAGNOSIS — F329 Major depressive disorder, single episode, unspecified: Secondary | ICD-10-CM

## 2013-06-02 MED ORDER — BUPROPION HCL ER (XL) 300 MG PO TB24: 300.0000 mg | ORAL_TABLET | ORAL | Status: DC
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
## 2013-06-07 ENCOUNTER — Ambulatory Visit (HOSPITAL_COMMUNITY): Payer: Self-pay | Admitting: Psychiatry

## 2013-06-10 ENCOUNTER — Telehealth: Payer: Self-pay

## 2013-06-10 NOTE — Telephone Encounter (Signed)
REVIEWED.  

## 2013-06-10 NOTE — Telephone Encounter (Signed)
Pt called to let Dr. Darrick Penna know that he is doing well now. He went to [facility] this week and got his meds readjusted, and is doing much better now.

## 2013-07-07 ENCOUNTER — Ambulatory Visit: Payer: Self-pay | Admitting: Gastroenterology

## 2013-07-27 ENCOUNTER — Ambulatory Visit (INDEPENDENT_AMBULATORY_CARE_PROVIDER_SITE_OTHER): Payer: Medicaid Other | Admitting: Gastroenterology

## 2013-07-27 ENCOUNTER — Encounter: Payer: Self-pay | Admitting: Gastroenterology

## 2013-07-27 ENCOUNTER — Other Ambulatory Visit: Payer: Self-pay | Admitting: Gastroenterology

## 2013-07-27 VITALS — BP 151/93 | HR 102 | Temp 97.4°F | Ht 68.0 in | Wt 145.8 lb

## 2013-07-27 DIAGNOSIS — K638219 Small intestinal bacterial overgrowth, unspecified: Secondary | ICD-10-CM

## 2013-07-27 DIAGNOSIS — R197 Diarrhea, unspecified: Secondary | ICD-10-CM

## 2013-07-27 DIAGNOSIS — K6389 Other specified diseases of intestine: Secondary | ICD-10-CM

## 2013-07-27 HISTORY — DX: Other specified diseases of intestine: K63.89

## 2013-07-27 HISTORY — DX: Small intestinal bacterial overgrowth, unspecified: K63.8219

## 2013-07-27 NOTE — Assessment & Plan Note (Addendum)
ASSOCIATED WITH UN-INTETNTIONAL WEIGHT LOSS. SX IMPROVED ON MEDS. ETIOLOGY UNKNOWN: MEDS, DIABETES, SIBO, LESS LIKELY SB ULCERS  AVOID FOOD THAT TRIGGER DIARRHEA CONTINUE BENTYL HBT NEXT THUR/FRI REFER TO [facility] FOR SECOND OPINION. PT MAY NEED GIVENS STUDY OF SMALL BOWEL AND 24 HR STOOL COLLECTION. DISCUSSED WITH PT. EXPLAINED WHY A STOOL COLLECTION MAY BE NEEDED. NOT INTERESTED IN COLLECTING STOOL AT THIS TIME. OPV IN 3 MOS  TIME FOR DISCUSSION, HPI, PE-45 MINS.
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
## 2013-07-27 NOTE — Progress Notes (Signed)
CC'd to PCP 

## 2013-07-27 NOTE — Patient Instructions (Signed)
CONTINUE TO AVOID FOOD THAT TRIGGERS DIARRHEA.  CONTINUE BENTYL.  HYDROGEN BREATH TEST NEXT WEEK TO EVALUATE FOR THE WRONG BACTERIA MIX IN YOUR SMALL BOWEL.  REFER TO [facility] FOR SECOND OPINION.   FOLLOW UP IN 3 MOS.

## 2013-07-28 NOTE — Progress Notes (Signed)
Referral has been made to [facility] GI Dept, they will review the notes and contact Anthony Price for office visit

## 2013-07-29 ENCOUNTER — Encounter (HOSPITAL_COMMUNITY): Payer: Self-pay | Admitting: Pharmacy Technician

## 2013-08-01 ENCOUNTER — Encounter (HOSPITAL_COMMUNITY)
Admission: RE | Disposition: A | Discharge status: Home / Self Care | Payer: Self-pay | Source: Ambulatory Visit | Attending: Gastroenterology

## 2013-08-01 ENCOUNTER — Ambulatory Visit (HOSPITAL_COMMUNITY)
Admission: RE | Admit: 2013-08-01 | Discharge: 2013-08-01 | Disposition: A | Discharge status: Home / Self Care | Payer: Medicaid Other | Source: Ambulatory Visit | Attending: Gastroenterology | Admitting: Gastroenterology

## 2013-08-01 ENCOUNTER — Encounter (HOSPITAL_COMMUNITY): Payer: Self-pay

## 2013-08-01 DIAGNOSIS — E119 Type 2 diabetes mellitus without complications: Secondary | ICD-10-CM | POA: Insufficient documentation

## 2013-08-01 DIAGNOSIS — R197 Diarrhea, unspecified: Secondary | ICD-10-CM | POA: Insufficient documentation

## 2013-08-01 HISTORY — PX: BACTERIAL OVERGROWTH TEST: SHX5739

## 2013-08-01 SURGERY — BREATH TEST, FOR INTESTINAL BACTERIAL OVERGROWTH

## 2013-08-01 MED ORDER — LACTULOSE 10 GM/15ML PO SOLN
ORAL | Status: AC
  Filled 2013-08-01: qty 60

## 2013-08-01 MED ORDER — LACTULOSE 10 GM/15ML PO SOLN
37.5000 g | Freq: Once | ORAL | Status: AC
  Administered 2013-08-01: 37.5 g via ORAL

## 2013-08-01 NOTE — Progress Notes (Signed)
Reminder in epic °

## 2013-08-01 NOTE — Progress Notes (Signed)
No beans, bran or high fiber cereal the day before the procedure? no NPO except for water 12 hours before procedure? yes No smoking, sleeping or vigorous exercising for at least 30 before procedure? no Recent antibiotic use and/or diarrhea? Diarrhea 2 days ago   If yes, physician notified.  Time Baseline 15 mins 30 mins 45 mins 60 mins 75 mins 90 mins 105 mins 120 mins 135 mins 150 mins 165 mins 180 mins  H2-ppm 2 4 14   39  45   58  63 61  64   53  61 50 50

## 2013-08-02 ENCOUNTER — Encounter (HOSPITAL_COMMUNITY): Payer: Self-pay | Admitting: Gastroenterology

## 2013-08-02 DIAGNOSIS — R197 Diarrhea, unspecified: Secondary | ICD-10-CM

## 2013-08-04 NOTE — Op Note (Signed)
PRE-OPERATIVE DIAGNOSIS:  DIARRHEA  POST-OPERATIVE DIAGNOSIS:  SIBO  PROCEDURE:  Procedure(s): HYDROGEN BREATH TEST  SURGEON:  Surgeon(s): Arlyce Harman, MD  MEDICATIONS USED:  LACTULOSE  FINDINGS: BREATHALYZER- O MIN(2 PPM), FIRST PEAK (64 PPM, 120 MINS), TROUGH (53 PPM 135 MINS), SECOND PEAK (61 PPM, 150 MINS)  DIAGNOSIS: SMALL BOWEL BACTERIAL OVERGROWTH. Differential diagnosis includes colonic reading caused 2nd peak.  PLAN OF CARE:  1. AUGMENTIN 500 MG BID for 5 days. 2. CHANGE CELLCEPT 2 PO BID FOR 5 DAYS AND THEN REDUCE TO ONE IN AM AND 2 IN PM 3/ TAKE 1/2 WELLBUTRIN DOSE WHILE ON AUGMENTIN 4. CONTINUE BENTYL. CUT DOSE IN HALF IF STOOLS FORM UP. 5. AWAIT GI APPT AT [facility] JAN 2. CONTACT TRANSPLANT MDs REGARDING ADDITION OF AUGMENTIN.  MEDICATION INTERACTIONS REVIEWED WITH PHARMACY.

## 2013-08-05 ENCOUNTER — Telehealth: Payer: Self-pay | Admitting: Gastroenterology

## 2013-08-05 MED ORDER — AMOXICILLIN-POT CLAVULANATE 500-125 MG PO TABS: ORAL_TABLET | ORAL | Status: DC

## 2013-08-05 NOTE — Telephone Encounter (Signed)
Called patient TO DISCUSS RESULTS. EXPLAINED HBT FOR SIBO POS. AUGMENTIN FOR 5 DAYS. REVIEWED MEDS WITH PHARMACY.

## 2013-08-11 ENCOUNTER — Telehealth: Payer: Self-pay | Admitting: *Deleted

## 2013-08-11 NOTE — Telephone Encounter (Signed)
I called pt. He is still having some diarrhea ( about 2 times a day). Watery and loose. He said he cannot be seen at [facility] GI until 10/28/2013. He had called and he said they did not feel that his Cellcept was being given correctly now. He has some questions and concerns if Dr. Darrick Penna can call him.

## 2013-08-11 NOTE — Telephone Encounter (Signed)
SPOKE TO PT. AUGMENTIN DIDN'T HELP. TRANSPLANT COORDINATOR  WANTS TO SEE ID DOCTOR. GI APPT JAN 2015. CALL [facility] GI DEPT AND GET PT ON A CANCELLATION. CURRENTLY TAKING BENTYL 10 MG 2 PO BID. HAVING 2 STOOLS A DAY-NOT WATERY LIKE IT WAS WHEN HE HAD THE CDIFF. MOST OF TIME GOES AFTER A  CUP OF COFFEE. LOOKS GRAINY. CHANGE BENTYL TWO BEFORE MEALS BID AND AT BEDTIME.  AWAIT GI APPT AT [facility].

## 2013-08-11 NOTE — Telephone Encounter (Signed)
Pt states the antibiotic that Dr. Darrick Penna has called in for him he's been taking it for 5 days and it has not helped, pt states it did not help with diarrhea. He states he did help with the hurting of his stomach a little bit. Please advise [phone]

## 2013-08-12 NOTE — Telephone Encounter (Signed)
Done

## 2013-08-22 ENCOUNTER — Telehealth: Payer: Self-pay | Admitting: Gastroenterology

## 2013-08-22 NOTE — Telephone Encounter (Signed)
I called pt.He said he has been doing a lot better since he completed the Augmentin. He is thinking about cancelling the appt at [facility]. I told him it might be too early to cancel yet, as long as it takes to get appt. He said he will wait a while longer. He just wanted to let Dr. Darrick Penna know and see what she thought of it. Also, he received a letter to call and follow up here with DR. Fields. He wants to know if she still wants to see him back and when.

## 2013-08-22 NOTE — Telephone Encounter (Signed)
Pt's wife called for patient. They had received a letter to make a follow up appt with SF in November. They said that [facility] had referred pt to [facility] and he is feeling better and wants to cancel his appt there. I told her since patient was seen in Oct by SF it was [facility] for him to follow up with SF in Jan 2015. I told them the schedule for January was not out to schedule that far out yet unless he was having problems and felt he needed to be seen sooner. They couldn't decide what they wanted to do. She was asking multiple questions about his condition and Cdiff and his diet, etc. I told her that I would have to get the nurse to call them back to answer those questions. If patient is still having problems or concerns shouldn't they keep appt at [facility] and follow up with SF after that? Patient was using wife as a go between during conversation and wife said he was shaking his head no that he wasn't going to [facility]. Please advise when this patient needs to come back to see SF. [phone] or [phone]

## 2013-08-22 NOTE — Telephone Encounter (Signed)
PLEASE CALL PT. IF HE IS FEELING BETTER HE CAN WAIT UNTIL MID-JAN TO SEE ME. HE SHOULD WAIT TO CANCEL HIS APPT AT [facility]. HE SHOULD WAIT UNTIL 7 DAYS PRIOR TO HIS APPT AT [facility] BEFORE HE CANCELS IT. IT TAKE A LONG TIME TO GET AN APPT AND IF HE CANCELS NOW AND THE DARRHEA COMES BACK THE HE WILL HAVE T WAIT AN LONG TIME TO GET AN APPT.

## 2013-08-23 NOTE — Telephone Encounter (Signed)
Called and informed pt. He will need appt for Jan 2015.

## 2013-08-23 NOTE — Telephone Encounter (Signed)
Reminder in Epic 

## 2013-09-01 ENCOUNTER — Other Ambulatory Visit: Payer: Self-pay

## 2013-09-02 ENCOUNTER — Ambulatory Visit (INDEPENDENT_AMBULATORY_CARE_PROVIDER_SITE_OTHER): Payer: 59 | Admitting: Psychiatry

## 2013-09-02 ENCOUNTER — Encounter (HOSPITAL_COMMUNITY): Payer: Self-pay | Admitting: Psychiatry

## 2013-09-02 VITALS — BP 150/98 | Ht 68.0 in | Wt 145.0 lb

## 2013-09-02 DIAGNOSIS — F329 Major depressive disorder, single episode, unspecified: Secondary | ICD-10-CM

## 2013-09-02 DIAGNOSIS — F32A Depression, unspecified: Secondary | ICD-10-CM

## 2013-09-02 MED ORDER — ALPRAZOLAM 1 MG PO TABS: 1.0000 mg | ORAL_TABLET | Freq: Three times a day (TID) | ORAL | Status: DC

## 2013-09-02 MED ORDER — BUPROPION HCL ER (XL) 300 MG PO TB24: 300.0000 mg | ORAL_TABLET | ORAL | Status: DC
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
## 2013-11-22 ENCOUNTER — Encounter (HOSPITAL_COMMUNITY): Payer: Self-pay | Admitting: Emergency Medicine

## 2013-11-22 ENCOUNTER — Emergency Department (HOSPITAL_COMMUNITY): Payer: Medicaid Other

## 2013-11-22 ENCOUNTER — Emergency Department (HOSPITAL_COMMUNITY)
Admission: EM | Admit: 2013-11-22 | Discharge: 2013-11-22 | Disposition: A | Discharge status: Home / Self Care | Payer: Medicaid Other | Attending: Emergency Medicine | Admitting: Emergency Medicine

## 2013-11-22 DIAGNOSIS — K219 Gastro-esophageal reflux disease without esophagitis: Secondary | ICD-10-CM | POA: Insufficient documentation

## 2013-11-22 DIAGNOSIS — Z8619 Personal history of other infectious and parasitic diseases: Secondary | ICD-10-CM | POA: Insufficient documentation

## 2013-11-22 DIAGNOSIS — E119 Type 2 diabetes mellitus without complications: Secondary | ICD-10-CM | POA: Insufficient documentation

## 2013-11-22 DIAGNOSIS — E7800 Pure hypercholesterolemia, unspecified: Secondary | ICD-10-CM | POA: Insufficient documentation

## 2013-11-22 DIAGNOSIS — Z941 Heart transplant status: Secondary | ICD-10-CM | POA: Insufficient documentation

## 2013-11-22 DIAGNOSIS — F329 Major depressive disorder, single episode, unspecified: Secondary | ICD-10-CM | POA: Insufficient documentation

## 2013-11-22 DIAGNOSIS — Z79899 Other long term (current) drug therapy: Secondary | ICD-10-CM | POA: Insufficient documentation

## 2013-11-22 DIAGNOSIS — F3289 Other specified depressive episodes: Secondary | ICD-10-CM | POA: Insufficient documentation

## 2013-11-22 DIAGNOSIS — Z87891 Personal history of nicotine dependence: Secondary | ICD-10-CM | POA: Insufficient documentation

## 2013-11-22 DIAGNOSIS — I252 Old myocardial infarction: Secondary | ICD-10-CM | POA: Insufficient documentation

## 2013-11-22 DIAGNOSIS — R0789 Other chest pain: Secondary | ICD-10-CM

## 2013-11-22 DIAGNOSIS — Z860100 Personal history of colon polyps, unspecified: Secondary | ICD-10-CM | POA: Insufficient documentation

## 2013-11-22 DIAGNOSIS — Z7982 Long term (current) use of aspirin: Secondary | ICD-10-CM | POA: Insufficient documentation

## 2013-11-22 DIAGNOSIS — G8929 Other chronic pain: Secondary | ICD-10-CM | POA: Insufficient documentation

## 2013-11-22 DIAGNOSIS — F411 Generalized anxiety disorder: Secondary | ICD-10-CM | POA: Insufficient documentation

## 2013-11-22 LAB — CBC
HEMATOCRIT: 52 % (ref 39.0–52.0)
HEMOGLOBIN: 17.3 g/dL — AB (ref 13.0–17.0)
MCH: 28.7 pg (ref 26.0–34.0)
MCHC: 33.3 g/dL (ref 30.0–36.0)
MCV: 86.4 fL (ref 78.0–100.0)
Platelets: 171 10*3/uL (ref 150–400)
RBC: 6.02 MIL/uL — ABNORMAL HIGH (ref 4.22–5.81)
RDW: 13.2 % (ref 11.5–15.5)
WBC: 7.7 10*3/uL (ref 4.0–10.5)

## 2013-11-22 LAB — TROPONIN I: Troponin I: <0.3 ng/mL (ref <0.30)

## 2013-11-22 LAB — BASIC METABOLIC PANEL
BUN: 12 mg/dL (ref 6–23)
CALCIUM: 10.1 mg/dL (ref 8.4–10.5)
CHLORIDE: 99 meq/L (ref 96–112)
CO2: 30 mEq/L (ref 19–32)
Creatinine, Ser: 1.46 mg/dL — ABNORMAL HIGH (ref 0.50–1.35)
GFR calc Af Amer: 61 mL/min — ABNORMAL LOW (ref >90)
GFR calc non Af Amer: 52 mL/min — ABNORMAL LOW (ref >90)
GLUCOSE: 101 mg/dL — AB (ref 70–99)
Potassium: 4.4 mEq/L (ref 3.7–5.3)
Sodium: 145 mEq/L (ref 137–147)

## 2013-11-22 LAB — PRO B NATRIURETIC PEPTIDE: Pro B Natriuretic peptide (BNP): 412.4 pg/mL — ABNORMAL HIGH (ref 0–125)

## 2013-11-22 NOTE — ED Notes (Signed)
Left sided chest discomfort starting today with sob and dizziness.

## 2013-11-22 NOTE — Discharge Instructions (Signed)
Watch for a rash. If you get a rash call your transplant doctor immediately. Take your pain medication if needed. Return if you get a fever, short of breath or you feel worse.
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
## 2013-12-01 ENCOUNTER — Ambulatory Visit (INDEPENDENT_AMBULATORY_CARE_PROVIDER_SITE_OTHER): Payer: Medicaid Other | Admitting: Gastroenterology

## 2013-12-01 ENCOUNTER — Encounter: Payer: Self-pay | Admitting: Gastroenterology

## 2013-12-01 VITALS — BP 146/91 | HR 113 | Temp 97.9°F | Ht 68.0 in | Wt 148.8 lb

## 2013-12-01 DIAGNOSIS — K6389 Other specified diseases of intestine: Secondary | ICD-10-CM

## 2013-12-01 NOTE — Progress Notes (Signed)
cc'd to pcp 

## 2013-12-01 NOTE — Patient Instructions (Signed)
CUT BENTYL DOSE IN HALF EVERY 2 WEEKS. YOU MAY STOP THE BENTYL IF DIARRHEA AND ABD CRAMPS DO NOT INCREASE WITH REDUCING YOUR DOSE.  CALL WITH QUESTIONS OR CONCERNS REGARDING LOOSE STOOLS/DIARRHEA. YOU MAY NEED ANTIBIOTICS PERIODICALLY.  FOLLOW UP IN JUN OR AUG 2015.
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
## 2013-12-01 NOTE — Assessment & Plan Note (Signed)
SX RESOLVED AFTER AUGMENTIN FOR 5 DAYS. TOLERATED ABX,  DISCUSSED THE NATURE OF SBBO AND NEED FOR PERIODIC ABX. PT VOICED UNDERSTANDING. HE WILL CALL IF LOOSE STOOLS OCCUR > 3X/WK.  TITRATE BENTYL TO OFF. OPV IN 4 TO 6 MOS. PT WILL BE SEEN IN JUN OR AUG 2015. CALL WITH QUESTIONS OR CONCERNS & WILL CALL IN ABX.

## 2013-12-02 ENCOUNTER — Ambulatory Visit (INDEPENDENT_AMBULATORY_CARE_PROVIDER_SITE_OTHER): Payer: 59 | Admitting: Psychiatry

## 2013-12-02 ENCOUNTER — Encounter (HOSPITAL_COMMUNITY): Payer: Self-pay | Admitting: Psychiatry

## 2013-12-02 VITALS — BP 160/90 | Ht 68.0 in | Wt 150.0 lb

## 2013-12-02 DIAGNOSIS — F3289 Other specified depressive episodes: Secondary | ICD-10-CM

## 2013-12-02 DIAGNOSIS — F329 Major depressive disorder, single episode, unspecified: Secondary | ICD-10-CM

## 2013-12-02 DIAGNOSIS — F32A Depression, unspecified: Secondary | ICD-10-CM

## 2013-12-02 MED ORDER — ALPRAZOLAM 1 MG PO TABS: 1.0000 mg | ORAL_TABLET | Freq: Three times a day (TID) | ORAL | Status: DC

## 2013-12-02 MED ORDER — BUPROPION HCL ER (XL) 300 MG PO TB24: 300.0000 mg | ORAL_TABLET | ORAL | Status: DC
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
## 2013-12-06 NOTE — Progress Notes (Signed)
Reminder in epic °

## 2013-12-28 ENCOUNTER — Telehealth: Payer: Self-pay | Admitting: General Practice

## 2013-12-28 NOTE — Telephone Encounter (Signed)
Patient was told he only had a 3.00 copayment for [facility] charges, he made that payment by phone to [facility].  His confirmation #[id number]

## 2014-02-28 ENCOUNTER — Encounter (HOSPITAL_COMMUNITY): Payer: Self-pay | Admitting: Psychiatry

## 2014-02-28 ENCOUNTER — Ambulatory Visit (INDEPENDENT_AMBULATORY_CARE_PROVIDER_SITE_OTHER): Payer: 59 | Admitting: Psychiatry

## 2014-02-28 VITALS — BP 140/90 | Ht 68.0 in | Wt 152.0 lb

## 2014-02-28 DIAGNOSIS — F418 Other specified anxiety disorders: Secondary | ICD-10-CM

## 2014-02-28 DIAGNOSIS — F3289 Other specified depressive episodes: Secondary | ICD-10-CM

## 2014-02-28 DIAGNOSIS — F329 Major depressive disorder, single episode, unspecified: Secondary | ICD-10-CM

## 2014-02-28 DIAGNOSIS — F32A Depression, unspecified: Secondary | ICD-10-CM

## 2014-02-28 MED ORDER — ALPRAZOLAM 1 MG PO TABS: 1.0000 mg | ORAL_TABLET | Freq: Four times a day (QID) | ORAL | Status: DC

## 2014-02-28 MED ORDER — BUPROPION HCL ER (XL) 300 MG PO TB24: 300.0000 mg | ORAL_TABLET | ORAL | Status: DC
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
## 2014-03-01 ENCOUNTER — Ambulatory Visit (HOSPITAL_COMMUNITY): Payer: Self-pay | Admitting: Psychiatry

## 2014-04-19 ENCOUNTER — Encounter: Payer: Self-pay | Admitting: Gastroenterology

## 2014-05-31 ENCOUNTER — Encounter (HOSPITAL_COMMUNITY): Payer: Self-pay | Admitting: Psychiatry

## 2014-05-31 ENCOUNTER — Ambulatory Visit (INDEPENDENT_AMBULATORY_CARE_PROVIDER_SITE_OTHER): Payer: 59 | Admitting: Psychiatry

## 2014-05-31 VITALS — BP 140/60 | Ht 68.0 in | Wt 152.0 lb

## 2014-05-31 DIAGNOSIS — F329 Major depressive disorder, single episode, unspecified: Secondary | ICD-10-CM

## 2014-05-31 DIAGNOSIS — F418 Other specified anxiety disorders: Secondary | ICD-10-CM

## 2014-05-31 DIAGNOSIS — F341 Dysthymic disorder: Secondary | ICD-10-CM

## 2014-05-31 DIAGNOSIS — F32A Depression, unspecified: Secondary | ICD-10-CM

## 2014-05-31 DIAGNOSIS — F3289 Other specified depressive episodes: Secondary | ICD-10-CM

## 2014-05-31 MED ORDER — BUPROPION HCL ER (XL) 300 MG PO TB24: 300.0000 mg | ORAL_TABLET | ORAL | Status: DC
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
## 2014-06-23 ENCOUNTER — Encounter: Payer: Self-pay | Admitting: Gastroenterology

## 2014-06-23 ENCOUNTER — Ambulatory Visit (INDEPENDENT_AMBULATORY_CARE_PROVIDER_SITE_OTHER): Payer: Medicaid Other | Admitting: Gastroenterology

## 2014-06-23 VITALS — BP 150/91 | HR 90 | Temp 97.6°F | Ht 68.0 in | Wt 152.8 lb

## 2014-06-23 DIAGNOSIS — K219 Gastro-esophageal reflux disease without esophagitis: Secondary | ICD-10-CM

## 2014-06-23 DIAGNOSIS — K6389 Other specified diseases of intestine: Secondary | ICD-10-CM

## 2014-06-23 MED ORDER — PANTOPRAZOLE SODIUM 40 MG PO TBEC: 40.0000 mg | DELAYED_RELEASE_TABLET | Freq: Every day | ORAL | Status: DC

## 2014-06-23 NOTE — Assessment & Plan Note (Signed)
Asymptomatic. Decrease Bentyl to 1 capsule BID and taper off if able.

## 2014-06-23 NOTE — Patient Instructions (Signed)
Stop Prilosec. Start Protonix once capsule each morning, 30 minutes before breakfast. I sent this to your pharmacy.   Wean down Bentyl. Decrease to 1 capsule twice a day.   We have referred you to an Ear, Nose, and Throat specialist for further evaluation of chronic hoarseness.   We will see you back in 6 months!

## 2014-06-23 NOTE — Assessment & Plan Note (Signed)
58 year old male with chronic GERD, on chronic Prilosec. Some exacerbations likely multifactorial related to diet choices, behavior. NO concerning signs. Trial Protonix now. Refer to ENT due to chronic hoarseness. ?LPR. EGD on file from 2013. Return in 6 months.
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
## 2014-06-26 ENCOUNTER — Other Ambulatory Visit: Payer: Self-pay

## 2014-06-26 DIAGNOSIS — R490 Dysphonia: Secondary | ICD-10-CM

## 2014-06-28 NOTE — Progress Notes (Signed)
Cc to pcp °

## 2014-07-13 ENCOUNTER — Ambulatory Visit (INDEPENDENT_AMBULATORY_CARE_PROVIDER_SITE_OTHER): Payer: Medicaid Other | Admitting: Otolaryngology

## 2014-07-13 DIAGNOSIS — R490 Dysphonia: Secondary | ICD-10-CM

## 2014-08-31 ENCOUNTER — Ambulatory Visit (INDEPENDENT_AMBULATORY_CARE_PROVIDER_SITE_OTHER): Payer: MEDICAID | Admitting: Psychiatry

## 2014-08-31 ENCOUNTER — Encounter (HOSPITAL_COMMUNITY): Payer: Self-pay | Admitting: Psychiatry

## 2014-08-31 VITALS — BP 162/97 | HR 101 | Ht 68.0 in | Wt 155.0 lb

## 2014-08-31 DIAGNOSIS — F32A Depression, unspecified: Secondary | ICD-10-CM

## 2014-08-31 DIAGNOSIS — F329 Major depressive disorder, single episode, unspecified: Secondary | ICD-10-CM

## 2014-08-31 MED ORDER — ALPRAZOLAM 1 MG PO TABS: 1.0000 mg | ORAL_TABLET | Freq: Three times a day (TID) | ORAL | Status: DC | PRN

## 2014-08-31 MED ORDER — BUPROPION HCL ER (XL) 300 MG PO TB24: 300.0000 mg | ORAL_TABLET | ORAL | Status: DC

## 2014-08-31 MED ORDER — VILAZODONE HCL 20 MG PO TABS: 20.0000 mg | ORAL_TABLET | Freq: Every day | ORAL | Status: DC
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
## 2014-09-29 ENCOUNTER — Encounter (HOSPITAL_COMMUNITY): Payer: Self-pay | Admitting: Psychiatry

## 2014-09-29 ENCOUNTER — Ambulatory Visit (INDEPENDENT_AMBULATORY_CARE_PROVIDER_SITE_OTHER): Payer: MEDICAID | Admitting: Psychiatry

## 2014-09-29 VITALS — BP 133/82 | HR 100 | Ht 68.0 in | Wt 156.8 lb

## 2014-09-29 DIAGNOSIS — F418 Other specified anxiety disorders: Secondary | ICD-10-CM

## 2014-09-29 DIAGNOSIS — F329 Major depressive disorder, single episode, unspecified: Secondary | ICD-10-CM

## 2014-09-29 DIAGNOSIS — F32A Depression, unspecified: Secondary | ICD-10-CM

## 2014-09-29 MED ORDER — BUPROPION HCL ER (XL) 300 MG PO TB24: 300.0000 mg | ORAL_TABLET | ORAL | Status: DC

## 2014-09-29 MED ORDER — ALPRAZOLAM 1 MG PO TABS: 1.0000 mg | ORAL_TABLET | Freq: Three times a day (TID) | ORAL | Status: DC | PRN

## 2014-09-29 MED ORDER — VILAZODONE HCL 10 MG PO TABS: 10.0000 mg | ORAL_TABLET | Freq: Every day | ORAL | Status: DC
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
## 2014-10-11 ENCOUNTER — Other Ambulatory Visit: Payer: Self-pay | Admitting: Gastroenterology

## 2014-10-30 ENCOUNTER — Ambulatory Visit (INDEPENDENT_AMBULATORY_CARE_PROVIDER_SITE_OTHER): Payer: MEDICAID | Admitting: Psychiatry

## 2014-10-30 DIAGNOSIS — F418 Other specified anxiety disorders: Secondary | ICD-10-CM
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
## 2014-10-30 NOTE — Patient Instructions (Signed)
Discussed orally 

## 2014-11-15 ENCOUNTER — Ambulatory Visit (INDEPENDENT_AMBULATORY_CARE_PROVIDER_SITE_OTHER): Payer: MEDICAID | Admitting: Psychiatry

## 2014-11-15 DIAGNOSIS — F419 Anxiety disorder, unspecified: Secondary | ICD-10-CM
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
## 2014-11-15 NOTE — Patient Instructions (Signed)
Discussed orally 

## 2014-11-28 ENCOUNTER — Encounter: Payer: Self-pay | Admitting: *Deleted

## 2014-11-28 ENCOUNTER — Encounter: Payer: Self-pay | Admitting: Internal Medicine

## 2014-11-29 ENCOUNTER — Ambulatory Visit (INDEPENDENT_AMBULATORY_CARE_PROVIDER_SITE_OTHER): Payer: MEDICAID | Admitting: Psychiatry

## 2014-11-29 DIAGNOSIS — F419 Anxiety disorder, unspecified: Secondary | ICD-10-CM

## 2014-11-29 NOTE — Patient Instructions (Signed)
Discussed orally 
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
## 2014-11-30 ENCOUNTER — Encounter (HOSPITAL_COMMUNITY): Payer: Self-pay | Admitting: Psychiatry

## 2014-11-30 ENCOUNTER — Ambulatory Visit (INDEPENDENT_AMBULATORY_CARE_PROVIDER_SITE_OTHER): Payer: MEDICAID | Admitting: Psychiatry

## 2014-11-30 VITALS — BP 148/88 | HR 92 | Ht 68.0 in | Wt 160.8 lb

## 2014-11-30 DIAGNOSIS — F419 Anxiety disorder, unspecified: Secondary | ICD-10-CM

## 2014-11-30 DIAGNOSIS — F32A Depression, unspecified: Secondary | ICD-10-CM

## 2014-11-30 DIAGNOSIS — F329 Major depressive disorder, single episode, unspecified: Secondary | ICD-10-CM

## 2014-11-30 MED ORDER — VILAZODONE HCL 10 MG PO TABS: 10.0000 mg | ORAL_TABLET | Freq: Every day | ORAL | Status: DC

## 2014-11-30 MED ORDER — BUPROPION HCL ER (XL) 300 MG PO TB24: 300.0000 mg | ORAL_TABLET | ORAL | Status: DC

## 2014-11-30 MED ORDER — ALPRAZOLAM 1 MG PO TABS: 1.0000 mg | ORAL_TABLET | Freq: Three times a day (TID) | ORAL | Status: DC | PRN
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
## 2014-12-19 ENCOUNTER — Ambulatory Visit (INDEPENDENT_AMBULATORY_CARE_PROVIDER_SITE_OTHER): Payer: MEDICAID | Admitting: Psychiatry

## 2014-12-19 DIAGNOSIS — F419 Anxiety disorder, unspecified: Secondary | ICD-10-CM
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
## 2014-12-19 NOTE — Patient Instructions (Signed)
Discussed orally 

## 2015-01-17 ENCOUNTER — Ambulatory Visit (INDEPENDENT_AMBULATORY_CARE_PROVIDER_SITE_OTHER): Payer: MEDICAID | Admitting: Psychiatry

## 2015-01-17 DIAGNOSIS — F419 Anxiety disorder, unspecified: Secondary | ICD-10-CM
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
## 2015-01-17 NOTE — Patient Instructions (Signed)
Discussed orally 

## 2015-02-14 ENCOUNTER — Ambulatory Visit (INDEPENDENT_AMBULATORY_CARE_PROVIDER_SITE_OTHER): Payer: MEDICAID | Admitting: Psychiatry

## 2015-02-14 DIAGNOSIS — F419 Anxiety disorder, unspecified: Secondary | ICD-10-CM
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
## 2015-02-14 NOTE — Patient Instructions (Signed)
Discussed orally 

## 2015-02-21 ENCOUNTER — Telehealth (HOSPITAL_COMMUNITY): Payer: Self-pay | Admitting: *Deleted

## 2015-02-28 ENCOUNTER — Ambulatory Visit (INDEPENDENT_AMBULATORY_CARE_PROVIDER_SITE_OTHER): Payer: MEDICAID | Admitting: Psychiatry

## 2015-02-28 ENCOUNTER — Encounter (HOSPITAL_COMMUNITY): Payer: Self-pay | Admitting: Psychiatry

## 2015-02-28 VITALS — BP 167/98 | HR 83 | Ht 68.0 in | Wt 161.8 lb

## 2015-02-28 DIAGNOSIS — F419 Anxiety disorder, unspecified: Secondary | ICD-10-CM

## 2015-02-28 DIAGNOSIS — F32A Depression, unspecified: Secondary | ICD-10-CM

## 2015-02-28 DIAGNOSIS — F329 Major depressive disorder, single episode, unspecified: Secondary | ICD-10-CM | POA: Diagnosis not present

## 2015-02-28 MED ORDER — ALPRAZOLAM 1 MG PO TABS: 1.0000 mg | ORAL_TABLET | Freq: Three times a day (TID) | ORAL | Status: DC | PRN

## 2015-02-28 MED ORDER — BUPROPION HCL ER (XL) 300 MG PO TB24: 300.0000 mg | ORAL_TABLET | ORAL | Status: DC

## 2015-02-28 MED ORDER — VILAZODONE HCL 10 MG PO TABS: 10.0000 mg | ORAL_TABLET | Freq: Every day | ORAL | Status: DC
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
## 2015-03-07 ENCOUNTER — Encounter: Payer: Self-pay | Admitting: Nurse Practitioner

## 2015-03-07 ENCOUNTER — Telehealth: Payer: Self-pay | Admitting: Gastroenterology

## 2015-03-07 ENCOUNTER — Ambulatory Visit: Payer: Medicaid Other | Admitting: Nurse Practitioner

## 2015-03-07 NOTE — Telephone Encounter (Signed)
PATIENT WAS A NO SHOW AND LETTER WAS SENT  °

## 2015-03-09 NOTE — Telephone Encounter (Signed)
Noted  

## 2015-03-27 ENCOUNTER — Ambulatory Visit (INDEPENDENT_AMBULATORY_CARE_PROVIDER_SITE_OTHER): Payer: MEDICAID | Admitting: Psychiatry

## 2015-03-27 DIAGNOSIS — F419 Anxiety disorder, unspecified: Secondary | ICD-10-CM | POA: Diagnosis not present
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
## 2015-03-27 NOTE — Patient Instructions (Signed)
Discussed orally 

## 2015-03-29 ENCOUNTER — Ambulatory Visit (INDEPENDENT_AMBULATORY_CARE_PROVIDER_SITE_OTHER): Payer: Medicaid Other | Admitting: Gastroenterology

## 2015-03-29 VITALS — BP 122/79 | HR 85 | Temp 98.4°F | Ht 68.0 in | Wt 158.4 lb

## 2015-03-29 DIAGNOSIS — K219 Gastro-esophageal reflux disease without esophagitis: Secondary | ICD-10-CM

## 2015-03-29 DIAGNOSIS — K6389 Other specified diseases of intestine: Secondary | ICD-10-CM

## 2015-03-29 MED ORDER — PANTOPRAZOLE SODIUM 40 MG PO TBEC: 40.0000 mg | DELAYED_RELEASE_TABLET | Freq: Every day | ORAL | Status: DC

## 2015-03-29 MED ORDER — AMOXICILLIN-POT CLAVULANATE 500-125 MG PO TABS: 1.0000 | ORAL_TABLET | Freq: Two times a day (BID) | ORAL | Status: DC

## 2015-03-29 NOTE — Assessment & Plan Note (Signed)
Chronic GERD, doing well with change to Protonix. Return in 1 year or sooner as needed.

## 2015-03-29 NOTE — Patient Instructions (Addendum)
You may decrease Bentyl to daily or just as needed.   Continue Protonix once daily.  We will treat with a week supply of antibiotics due to your history of small bowel bacterial overgrowth.   We will see you back in 1 year!!

## 2015-03-29 NOTE — Assessment & Plan Note (Signed)
Intermittent bloating, diarrhea about twice per week. On Bentyl BID doing well overall. Will give 7 day course of Augmentin for possible SBBO. Return in 1 year or sooner if needed.
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
## 2015-04-11 NOTE — Progress Notes (Signed)
cc'd to pcp 

## 2015-04-26 ENCOUNTER — Ambulatory Visit (HOSPITAL_COMMUNITY): Payer: Self-pay | Admitting: Psychiatry

## 2015-04-27 ENCOUNTER — Telehealth (HOSPITAL_COMMUNITY): Payer: Self-pay | Admitting: *Deleted

## 2015-05-03 ENCOUNTER — Other Ambulatory Visit (HOSPITAL_COMMUNITY): Payer: Self-pay | Admitting: Psychiatry

## 2015-05-08 ENCOUNTER — Telehealth (HOSPITAL_COMMUNITY): Payer: Self-pay | Admitting: *Deleted

## 2015-05-08 NOTE — Telephone Encounter (Signed)
Prior authorization for Viibryd received. Called [phone] spoke with Rob who states that without failing 2 preferred drugs it will be sent for review. If any more information can be obtained within 2 days a call can be returned to give more information. Otherwise a decision will be made and faxed to office.

## 2015-05-08 NOTE — Telephone Encounter (Signed)
He is already on wellbutrin and viibryd has been added for augmentation. He has a HEART TRANSPLANT and this is the safest of the SSRIs to use

## 2015-05-09 NOTE — Telephone Encounter (Signed)
Please let pt know

## 2015-05-09 NOTE — Telephone Encounter (Signed)
Called to notifiy pharmacy that [place] has been approved. They will fill and notify patient.

## 2015-05-09 NOTE — Telephone Encounter (Signed)
Called to give more information on Viibryd. [phone] spoke with Anthony Price who states it has been approved until 05/02/16. #[id number].

## 2015-05-16 ENCOUNTER — Ambulatory Visit (HOSPITAL_COMMUNITY): Payer: Self-pay | Admitting: Psychiatry

## 2015-05-23 ENCOUNTER — Ambulatory Visit (INDEPENDENT_AMBULATORY_CARE_PROVIDER_SITE_OTHER): Payer: Medicaid Other | Admitting: Psychiatry

## 2015-05-23 DIAGNOSIS — F419 Anxiety disorder, unspecified: Secondary | ICD-10-CM | POA: Diagnosis not present

## 2015-05-23 NOTE — Patient Instructions (Signed)
Discussed orally 
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
## 2015-05-31 ENCOUNTER — Encounter (HOSPITAL_COMMUNITY): Payer: Self-pay | Admitting: Psychiatry

## 2015-05-31 ENCOUNTER — Ambulatory Visit (INDEPENDENT_AMBULATORY_CARE_PROVIDER_SITE_OTHER): Payer: Medicaid Other | Admitting: Psychiatry

## 2015-05-31 VITALS — BP 140/84 | HR 89 | Ht 68.0 in | Wt 155.2 lb

## 2015-05-31 DIAGNOSIS — F329 Major depressive disorder, single episode, unspecified: Secondary | ICD-10-CM | POA: Diagnosis not present

## 2015-05-31 DIAGNOSIS — F419 Anxiety disorder, unspecified: Secondary | ICD-10-CM

## 2015-05-31 DIAGNOSIS — F32A Depression, unspecified: Secondary | ICD-10-CM

## 2015-05-31 MED ORDER — VILAZODONE HCL 10 MG PO TABS: 10.0000 mg | ORAL_TABLET | Freq: Every day | ORAL | Status: DC

## 2015-05-31 MED ORDER — ALPRAZOLAM 1 MG PO TABS: 1.0000 mg | ORAL_TABLET | Freq: Three times a day (TID) | ORAL | Status: DC | PRN

## 2015-05-31 MED ORDER — BUPROPION HCL ER (XL) 300 MG PO TB24: 300.0000 mg | ORAL_TABLET | ORAL | Status: DC
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
## 2015-06-02 ENCOUNTER — Other Ambulatory Visit (HOSPITAL_COMMUNITY): Payer: Self-pay | Admitting: Psychiatry

## 2015-06-22 ENCOUNTER — Encounter (HOSPITAL_COMMUNITY): Payer: Self-pay | Admitting: Psychiatry

## 2015-06-22 ENCOUNTER — Telehealth (HOSPITAL_COMMUNITY): Payer: Self-pay | Admitting: *Deleted

## 2015-06-22 ENCOUNTER — Ambulatory Visit (INDEPENDENT_AMBULATORY_CARE_PROVIDER_SITE_OTHER): Payer: Medicaid Other | Admitting: Psychiatry

## 2015-06-22 DIAGNOSIS — F419 Anxiety disorder, unspecified: Secondary | ICD-10-CM

## 2015-06-22 NOTE — Telephone Encounter (Signed)
done
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
## 2015-06-22 NOTE — Telephone Encounter (Signed)
Pt came into office to pick up his printed letter that was requested in previous message. Pt D/L number is [phone] with expiration number of 02-04-2016

## 2015-06-22 NOTE — Patient Instructions (Signed)
Discussed orally 

## 2015-06-22 NOTE — Telephone Encounter (Signed)
Pt came into office to see another provider today. Per pt, he have Jury Duty on 07-09-15 and would like for Dr. Harrington Challenger to write him a letter stating that he is unable to attend due to his situation. Letter is needed to be written before 07-02-15. Pt number is [phone].

## 2015-07-23 ENCOUNTER — Encounter (HOSPITAL_COMMUNITY): Payer: Self-pay | Admitting: Psychiatry

## 2015-07-23 ENCOUNTER — Ambulatory Visit (INDEPENDENT_AMBULATORY_CARE_PROVIDER_SITE_OTHER): Payer: Medicaid Other | Admitting: Psychiatry

## 2015-07-23 DIAGNOSIS — F419 Anxiety disorder, unspecified: Secondary | ICD-10-CM | POA: Diagnosis not present

## 2015-07-23 NOTE — Patient Instructions (Signed)
Discussed orally 
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
## 2015-07-31 ENCOUNTER — Other Ambulatory Visit (HOSPITAL_COMMUNITY): Payer: Self-pay | Admitting: Psychiatry

## 2015-08-07 ENCOUNTER — Other Ambulatory Visit (HOSPITAL_COMMUNITY): Payer: Self-pay | Admitting: Psychiatry

## 2015-08-22 ENCOUNTER — Ambulatory Visit (INDEPENDENT_AMBULATORY_CARE_PROVIDER_SITE_OTHER): Payer: Medicaid Other | Admitting: Psychiatry

## 2015-08-22 ENCOUNTER — Encounter (HOSPITAL_COMMUNITY): Payer: Self-pay | Admitting: Psychiatry

## 2015-08-22 DIAGNOSIS — F419 Anxiety disorder, unspecified: Secondary | ICD-10-CM | POA: Diagnosis not present

## 2015-08-22 NOTE — Patient Instructions (Signed)
Discussed orally 
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
## 2015-08-31 ENCOUNTER — Encounter (HOSPITAL_COMMUNITY): Payer: Self-pay | Admitting: Psychiatry

## 2015-08-31 ENCOUNTER — Other Ambulatory Visit: Payer: Self-pay | Admitting: Gastroenterology

## 2015-08-31 ENCOUNTER — Ambulatory Visit (INDEPENDENT_AMBULATORY_CARE_PROVIDER_SITE_OTHER): Payer: Medicaid Other | Admitting: Psychiatry

## 2015-08-31 VITALS — BP 153/96 | HR 88 | Ht 68.0 in | Wt 152.8 lb

## 2015-08-31 DIAGNOSIS — F329 Major depressive disorder, single episode, unspecified: Secondary | ICD-10-CM

## 2015-08-31 DIAGNOSIS — F32A Depression, unspecified: Secondary | ICD-10-CM

## 2015-08-31 DIAGNOSIS — F419 Anxiety disorder, unspecified: Secondary | ICD-10-CM

## 2015-08-31 MED ORDER — ALPRAZOLAM 1 MG PO TABS: 1.0000 mg | ORAL_TABLET | Freq: Three times a day (TID) | ORAL | Status: DC | PRN

## 2015-08-31 MED ORDER — VILAZODONE HCL 10 MG PO TABS: 10.0000 mg | ORAL_TABLET | Freq: Every day | ORAL | Status: DC

## 2015-08-31 MED ORDER — BUPROPION HCL ER (XL) 300 MG PO TB24: 300.0000 mg | ORAL_TABLET | ORAL | Status: DC
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
## 2015-09-18 ENCOUNTER — Ambulatory Visit (INDEPENDENT_AMBULATORY_CARE_PROVIDER_SITE_OTHER): Payer: Medicaid Other | Admitting: Psychiatry

## 2015-09-18 ENCOUNTER — Encounter (HOSPITAL_COMMUNITY): Payer: Self-pay | Admitting: Psychiatry

## 2015-09-18 DIAGNOSIS — F419 Anxiety disorder, unspecified: Secondary | ICD-10-CM | POA: Diagnosis not present

## 2015-09-18 NOTE — Patient Instructions (Signed)
Discussed orally 
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
## 2015-10-18 ENCOUNTER — Encounter (HOSPITAL_COMMUNITY): Payer: Self-pay | Admitting: Psychiatry

## 2015-10-18 ENCOUNTER — Ambulatory Visit (INDEPENDENT_AMBULATORY_CARE_PROVIDER_SITE_OTHER): Payer: Medicaid Other | Admitting: Psychiatry

## 2015-10-18 DIAGNOSIS — F419 Anxiety disorder, unspecified: Secondary | ICD-10-CM | POA: Diagnosis not present
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
## 2015-10-18 NOTE — Patient Instructions (Signed)
Discussed orally 

## 2015-11-01 ENCOUNTER — Other Ambulatory Visit (HOSPITAL_COMMUNITY): Payer: Self-pay | Admitting: Psychiatry

## 2015-11-15 ENCOUNTER — Ambulatory Visit (INDEPENDENT_AMBULATORY_CARE_PROVIDER_SITE_OTHER): Payer: Medicaid Other | Admitting: Family Medicine

## 2015-11-15 ENCOUNTER — Encounter: Payer: Self-pay | Admitting: Family Medicine

## 2015-11-15 VITALS — BP 179/105 | HR 88 | Temp 97.5°F | Ht 68.0 in | Wt 158.2 lb

## 2015-11-15 DIAGNOSIS — E1122 Type 2 diabetes mellitus with diabetic chronic kidney disease: Secondary | ICD-10-CM

## 2015-11-15 DIAGNOSIS — K2100 Gastro-esophageal reflux disease with esophagitis, without bleeding: Secondary | ICD-10-CM

## 2015-11-15 DIAGNOSIS — N182 Chronic kidney disease, stage 2 (mild): Secondary | ICD-10-CM | POA: Diagnosis not present

## 2015-11-15 DIAGNOSIS — E119 Type 2 diabetes mellitus without complications: Secondary | ICD-10-CM | POA: Insufficient documentation

## 2015-11-15 DIAGNOSIS — E1159 Type 2 diabetes mellitus with other circulatory complications: Secondary | ICD-10-CM | POA: Insufficient documentation

## 2015-11-15 DIAGNOSIS — I10 Essential (primary) hypertension: Secondary | ICD-10-CM | POA: Diagnosis not present

## 2015-11-15 DIAGNOSIS — Z23 Encounter for immunization: Secondary | ICD-10-CM | POA: Diagnosis not present

## 2015-11-15 LAB — HEMOGLOBIN A1C: Hemoglobin A1C: 6.1

## 2015-11-15 MED ORDER — METFORMIN HCL 500 MG PO TABS: 500.0000 mg | ORAL_TABLET | Freq: Two times a day (BID) | ORAL | Status: DC

## 2015-11-15 MED ORDER — DEXLANSOPRAZOLE 30 MG PO CPDR: 30.0000 mg | DELAYED_RELEASE_CAPSULE | Freq: Every day | ORAL | Status: DC

## 2015-11-15 MED ORDER — CARVEDILOL 12.5 MG PO TABS: 12.5000 mg | ORAL_TABLET | Freq: Two times a day (BID) | ORAL | Status: DC

## 2015-11-15 MED ORDER — ATORVASTATIN CALCIUM 20 MG PO TABS: 20.0000 mg | ORAL_TABLET | Freq: Every day | ORAL | Status: DC

## 2015-11-15 MED ORDER — AMLODIPINE BESYLATE 5 MG PO TABS: 5.0000 mg | ORAL_TABLET | Freq: Every day | ORAL | Status: DC

## 2015-11-15 NOTE — Assessment & Plan Note (Addendum)
We'll give samples for Dexilant and see if that helps. He has been on omeprazole prior to the Protonix and doesn't feel like the Protonix is helping sufficiently

## 2015-11-15 NOTE — Assessment & Plan Note (Signed)
He brought labs with him show that his last A1c was 6.1. Continue metformin 500 twice a day

## 2015-11-15 NOTE — Assessment & Plan Note (Signed)
Uncontrolled today in office, will add Norvasc and switch atenolol to carvedilol. Continue ramipril.
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
## 2015-11-20 ENCOUNTER — Telehealth: Payer: Self-pay

## 2015-11-20 ENCOUNTER — Ambulatory Visit (INDEPENDENT_AMBULATORY_CARE_PROVIDER_SITE_OTHER): Payer: Medicaid Other | Admitting: Psychiatry

## 2015-11-20 ENCOUNTER — Encounter (HOSPITAL_COMMUNITY): Payer: Self-pay | Admitting: Psychiatry

## 2015-11-20 DIAGNOSIS — F419 Anxiety disorder, unspecified: Secondary | ICD-10-CM | POA: Diagnosis not present

## 2015-11-20 NOTE — Patient Instructions (Signed)
Discussed orally 
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
## 2015-11-20 NOTE — Telephone Encounter (Signed)
Medicaid non preferred  Dexilant  Preferred are Nexium OTC capsules, omeprazole capsule, pantoprazole tablet and Protonix susp.

## 2015-11-21 MED ORDER — ESOMEPRAZOLE MAGNESIUM 20 MG PO CPDR: 20.0000 mg | DELAYED_RELEASE_CAPSULE | Freq: Every day | ORAL | Status: DC

## 2015-11-21 NOTE — Telephone Encounter (Signed)
Left detailed message stating new rx sent over to pharmacy since insurance didn't cover the original one and to CB with any further questions or concerns.

## 2015-11-21 NOTE — Telephone Encounter (Signed)
Sends Nexium OTC 20 mg daily for 30 days with 5 refills to replace the [place] insurance didn't pay for

## 2015-11-29 ENCOUNTER — Encounter (HOSPITAL_COMMUNITY): Payer: Self-pay | Admitting: Psychiatry

## 2015-11-29 ENCOUNTER — Ambulatory Visit (INDEPENDENT_AMBULATORY_CARE_PROVIDER_SITE_OTHER): Payer: Medicaid Other | Admitting: Psychiatry

## 2015-11-29 VITALS — BP 140/88 | HR 101 | Ht 68.0 in | Wt 157.0 lb

## 2015-11-29 DIAGNOSIS — F329 Major depressive disorder, single episode, unspecified: Secondary | ICD-10-CM

## 2015-11-29 DIAGNOSIS — F419 Anxiety disorder, unspecified: Secondary | ICD-10-CM | POA: Diagnosis not present

## 2015-11-29 DIAGNOSIS — F32A Depression, unspecified: Secondary | ICD-10-CM

## 2015-11-29 MED ORDER — VILAZODONE HCL 10 MG PO TABS: 10.0000 mg | ORAL_TABLET | Freq: Every day | ORAL | Status: DC

## 2015-11-29 MED ORDER — ALPRAZOLAM 1 MG PO TABS: 1.0000 mg | ORAL_TABLET | Freq: Three times a day (TID) | ORAL | Status: DC | PRN

## 2015-11-29 MED ORDER — BUPROPION HCL ER (XL) 300 MG PO TB24: 300.0000 mg | ORAL_TABLET | ORAL | Status: DC
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
## 2015-12-08 NOTE — Progress Notes (Signed)
REVIEWED-NO ADDITIONAL RECOMMENDATIONS. 

## 2015-12-13 ENCOUNTER — Ambulatory Visit (INDEPENDENT_AMBULATORY_CARE_PROVIDER_SITE_OTHER): Payer: Medicaid Other | Admitting: Family Medicine

## 2015-12-13 ENCOUNTER — Encounter: Payer: Self-pay | Admitting: Family Medicine

## 2015-12-13 VITALS — BP 139/88 | HR 103 | Temp 97.6°F | Ht 68.0 in | Wt 155.6 lb

## 2015-12-13 DIAGNOSIS — Z941 Heart transplant status: Secondary | ICD-10-CM

## 2015-12-13 DIAGNOSIS — I10 Essential (primary) hypertension: Secondary | ICD-10-CM | POA: Diagnosis not present

## 2015-12-13 DIAGNOSIS — R509 Fever, unspecified: Secondary | ICD-10-CM | POA: Diagnosis not present

## 2015-12-13 DIAGNOSIS — J019 Acute sinusitis, unspecified: Secondary | ICD-10-CM

## 2015-12-13 LAB — POCT INFLUENZA A/B
INFLUENZA B, POC: NEGATIVE
Influenza A, POC: NEGATIVE

## 2015-12-13 MED ORDER — CEFDINIR 300 MG PO CAPS: 300.0000 mg | ORAL_CAPSULE | Freq: Two times a day (BID) | ORAL | Status: DC

## 2015-12-13 NOTE — Progress Notes (Signed)
BP 139/88 mmHg  Pulse 103  Temp(Src) 97.6 F (36.4 C) (Oral)  Ht 5\' 8"  (1.727 m)  Wt 155 lb 9.6 oz (70.58 kg)  BMI 23.66 kg/m2   Subjective:    Patient ID: Anthony Price, male    DOB: [birth year], 60 y.o.   MRN: [id number]  HPI: Anthony Price is a 60 y.o. male presenting on 12/13/2015 for Hypertension and Fever, chills, body aches   HPI Hypertension recheck Patient comes in today for hypertension recheck. His blood pressure today is 139/88. He is currently taking Coreg, and ramipril. Patient denies headaches, blurred vision, chest pains, shortness of breath, or weakness. Denies any side effects from medication and is content with current medication. He has a history of a heart transplant and blood pressure control is very important to keep heart study and stable.  Fever and chills and sinus congestion Patient has a three-day history of fevers and chills and body aches and sinus congestion and postnasal drainage and cough that is productive of yellow-green sputum. He denies any shortness of breath or wheezing. He has been using over-the-counter Mucinex which is helped some but not completely. He does not know of any specific sick contacts. He has a history of a heart transplant and is on immunosuppressives.   Relevant past medical, surgical, family and social history reviewed and updated as indicated. Interim medical history since our last visit reviewed. Allergies and medications reviewed and updated.  Review of Systems  Constitutional: Positive for fever and chills.  HENT: Positive for congestion, postnasal drip, rhinorrhea, sinus pressure, sneezing and sore throat. Negative for ear discharge, ear pain and voice change.   Eyes: Negative for pain, discharge, redness and visual disturbance.  Respiratory: Positive for cough. Negative for chest tightness, shortness of breath and wheezing.   Cardiovascular: Negative for chest pain and leg swelling.  Gastrointestinal: Negative for  abdominal pain, diarrhea and constipation.  Genitourinary: Negative for difficulty urinating.  Musculoskeletal: Negative for back pain and gait problem.  Skin: Negative for rash.  Neurological: Negative for syncope, light-headedness and headaches.  All other systems reviewed and are negative.   Per HPI unless specifically indicated above     Medication List       This list is accurate as of: 12/13/15 11:41 AM.  Always use your most recent med list.               acetaminophen 500 MG tablet  Commonly known as:  TYLENOL  Take 500 mg by mouth every 6 (six) hours as needed. Pain     ALPRAZolam 1 MG tablet  Commonly known as:  XANAX  Take 1 tablet (1 mg total) by mouth 3 (three) times daily as needed for anxiety.     aspirin EC 81 MG tablet  Take 81 mg by mouth daily.     atorvastatin 20 MG tablet  Commonly known as:  LIPITOR  Take 1 tablet (20 mg total) by mouth daily.     buPROPion 300 MG 24 hr tablet  Commonly known as:  WELLBUTRIN XL  Take 1 tablet (300 mg total) by mouth every morning.     carvedilol 12.5 MG tablet  Commonly known as:  COREG  Take 1 tablet (12.5 mg total) by mouth 2 (two) times daily with a meal.     cefdinir 300 MG capsule  Commonly known as:  OMNICEF  Take 1 capsule (300 mg total) by mouth 2 (two) times daily.     esomeprazole 20  MG capsule  Commonly known as:  NEXIUM 24HR  Take 1 capsule (20 mg total) by mouth daily at 12 noon.     metFORMIN 500 MG tablet  Commonly known as:  GLUCOPHAGE  Take 1 tablet (500 mg total) by mouth 2 (two) times daily with a meal.     mycophenolate 500 MG tablet  Commonly known as:  CELLCEPT  Take 500-1,000 mg by mouth 2 (two) times daily. Patient takes 1 tablet (500mg ) in the morning and 2 tablets(1000mg )     PROBIOTIC DAILY PO  Take by mouth daily.     ramipril 2.5 MG capsule  Commonly known as:  ALTACE  Take 2.5 mg by mouth daily.     tacrolimus 1 MG capsule  Commonly known as:  PROGRAF  Take 1 mg by  mouth. 1 mg in the am and 0.5 mg in the pm     Vilazodone HCl 10 MG Tabs  Commonly known as:  VIIBRYD  Take 1 tablet (10 mg total) by mouth daily.           Objective:    BP 139/88 mmHg  Pulse 103  Temp(Src) 97.6 F (36.4 C) (Oral)  Ht 5\' 8"  (1.727 m)  Wt 155 lb 9.6 oz (70.58 kg)  BMI 23.66 kg/m2  Wt Readings from Last 3 Encounters:  12/13/15 155 lb 9.6 oz (70.58 kg)  11/29/15 157 lb (71.215 kg)  11/15/15 158 lb 3.2 oz (71.759 kg)    Physical Exam  Constitutional: He is oriented to person, place, and time. He appears well-developed and well-nourished. No distress.  HENT:  Right Ear: Tympanic membrane, external ear and ear canal normal.  Left Ear: Tympanic membrane, external ear and ear canal normal.  Nose: Mucosal edema and rhinorrhea present. No sinus tenderness. No epistaxis. Right sinus exhibits maxillary sinus tenderness. Right sinus exhibits no frontal sinus tenderness. Left sinus exhibits maxillary sinus tenderness. Left sinus exhibits no frontal sinus tenderness.  Mouth/Throat: Uvula is midline and mucous membranes are normal. Posterior oropharyngeal edema and posterior oropharyngeal erythema present. No oropharyngeal exudate or tonsillar abscesses.  Eyes: Conjunctivae and EOM are normal. Pupils are equal, round, and reactive to light. Right eye exhibits no discharge. No scleral icterus.  Neck: Neck supple. No thyromegaly present.  Cardiovascular: Normal rate, regular rhythm, normal heart sounds and intact distal pulses.   No murmur heard. Pulmonary/Chest: Effort normal and breath sounds normal. No respiratory distress. He has no wheezes.  Musculoskeletal: Normal range of motion. He exhibits no edema.  Lymphadenopathy:    He has no cervical adenopathy.  Neurological: He is alert and oriented to person, place, and time. Coordination normal.  Skin: Skin is warm and dry. No rash noted. He is not diaphoretic.  Psychiatric: He has a normal mood and affect. His behavior is  normal.  Vitals reviewed.   Results for orders placed or performed in visit on 12/13/15  POCT Influenza A/B  Result Value Ref Range   Influenza A, POC Negative Negative   Influenza B, POC Negative Negative      Assessment & Plan:   Problem List Items Addressed This Visit      Cardiovascular and Mediastinum   HTN (hypertension)   History of heart transplant ([place])    Other Visit Diagnoses    Fever, unspecified fever cause    -  Primary    Relevant Medications    cefdinir (OMNICEF) 300 MG capsule    Other Relevant Orders    POCT Influenza A/B (  Completed)    Acute rhinosinusitis        Relevant Medications    cefdinir (OMNICEF) 300 MG capsule       Follow up plan: Return in about 3 months (around 03/11/2016), or if symptoms worsen or fail to improve, for Hypertension recheck.  Counseling provided for all of the vaccine components Orders Placed This Encounter  Procedures  . POCT Influenza A/B    Caryl Pina, MD [facility] 12/13/2015, 11:41 AM

## 2015-12-17 DIAGNOSIS — Z941 Heart transplant status: Secondary | ICD-10-CM | POA: Insufficient documentation

## 2016-02-13 ENCOUNTER — Telehealth: Payer: Self-pay

## 2016-02-13 DIAGNOSIS — H269 Unspecified cataract: Secondary | ICD-10-CM

## 2016-02-13 NOTE — Telephone Encounter (Signed)
I was seen in [facility] and I need you to do a eye referral to [phone]

## 2016-02-13 NOTE — Telephone Encounter (Signed)
Go ahead and do the referral, thank you

## 2016-02-13 NOTE — Telephone Encounter (Signed)
Referral pended but i dont see what he needs the referral for. Please advise

## 2016-02-14 NOTE — Telephone Encounter (Signed)
Patient aware that referral has been placed.  

## 2016-02-14 NOTE — Telephone Encounter (Signed)
I don't know for sure the cause either because I don't have the records from his [facility] doctors as to why they want I referral, we will have to ask him why he needs a but he sees a lot of specialists and I'm sure there is a reason behind it we will just have to ask him.

## 2016-02-25 ENCOUNTER — Other Ambulatory Visit (HOSPITAL_COMMUNITY): Payer: Self-pay | Admitting: Psychiatry

## 2016-02-26 ENCOUNTER — Ambulatory Visit (INDEPENDENT_AMBULATORY_CARE_PROVIDER_SITE_OTHER): Payer: Medicaid Other | Admitting: Psychiatry

## 2016-02-26 ENCOUNTER — Encounter (HOSPITAL_COMMUNITY): Payer: Self-pay | Admitting: Psychiatry

## 2016-02-26 VITALS — BP 147/91 | HR 101 | Ht 68.0 in | Wt 153.6 lb

## 2016-02-26 DIAGNOSIS — F329 Major depressive disorder, single episode, unspecified: Secondary | ICD-10-CM

## 2016-02-26 DIAGNOSIS — F419 Anxiety disorder, unspecified: Secondary | ICD-10-CM | POA: Diagnosis not present

## 2016-02-26 DIAGNOSIS — F32A Depression, unspecified: Secondary | ICD-10-CM

## 2016-02-26 MED ORDER — BUPROPION HCL ER (XL) 300 MG PO TB24: 300.0000 mg | ORAL_TABLET | ORAL | Status: DC

## 2016-02-26 MED ORDER — ALPRAZOLAM 1 MG PO TABS: 1.0000 mg | ORAL_TABLET | Freq: Three times a day (TID) | ORAL | Status: DC | PRN

## 2016-02-26 MED ORDER — VILAZODONE HCL 10 MG PO TABS: 10.0000 mg | ORAL_TABLET | Freq: Every day | ORAL | Status: DC
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
## 2016-03-05 LAB — HM DIABETES EYE EXAM

## 2016-03-10 ENCOUNTER — Encounter: Payer: Self-pay | Admitting: Gastroenterology

## 2016-03-13 ENCOUNTER — Encounter: Payer: Self-pay | Admitting: Family Medicine

## 2016-03-13 ENCOUNTER — Ambulatory Visit (INDEPENDENT_AMBULATORY_CARE_PROVIDER_SITE_OTHER): Payer: Medicaid Other | Admitting: Family Medicine

## 2016-03-13 VITALS — BP 131/87 | HR 88 | Temp 97.0°F | Ht 68.0 in | Wt 155.0 lb

## 2016-03-13 DIAGNOSIS — E1122 Type 2 diabetes mellitus with diabetic chronic kidney disease: Secondary | ICD-10-CM | POA: Diagnosis not present

## 2016-03-13 DIAGNOSIS — I10 Essential (primary) hypertension: Secondary | ICD-10-CM | POA: Diagnosis not present

## 2016-03-13 DIAGNOSIS — N182 Chronic kidney disease, stage 2 (mild): Secondary | ICD-10-CM | POA: Diagnosis not present

## 2016-03-13 MED ORDER — METFORMIN HCL 1000 MG PO TABS: 1000.0000 mg | ORAL_TABLET | Freq: Two times a day (BID) | ORAL | Status: DC

## 2016-03-13 NOTE — Progress Notes (Signed)
BP 131/87 mmHg  Pulse 88  Temp(Src) 97 F (36.1 C) (Oral)  Ht 5\' 8"  (1.727 m)  Wt 155 lb (70.308 kg)  BMI 23.57 kg/m2   Subjective:    Patient ID: Anthony Price, male    DOB: 1956-08-26, 60 y.o.   MRN: [id number]  HPI: Anthony Price is a 60 y.o. male presenting on 03/13/2016 for Diabetes and Hypertension   HPI Hypertension recheck Patient is coming in today for hypertension recheck. His blood pressure today is 131/87. He is currently on Coreg and ramipril. Patient denies headaches, blurred vision, chest pains, shortness of breath, or weakness. Denies any side effects from medication and is content with current medication.   Type 2 diabetes Patient is coming in for a check on type 2 diabetes. Patient is currently on metformin. Patient is also on an ACE inhibitor and statin. Patient has not seen an ophthalmologist yet because of monetary issues. But will plan to see them soon. Patient denies any new issues with his feet or with his eyes.  Relevant past medical, surgical, family and social history reviewed and updated as indicated. Interim medical history since our last visit reviewed. Allergies and medications reviewed and updated.  Review of Systems  Constitutional: Negative for fever and chills.  HENT: Negative for ear discharge and ear pain.   Eyes: Negative for discharge and visual disturbance.  Respiratory: Negative for shortness of breath and wheezing.   Cardiovascular: Negative for chest pain and leg swelling.  Gastrointestinal: Negative for abdominal pain, diarrhea and constipation.  Genitourinary: Negative for difficulty urinating.  Musculoskeletal: Negative for back pain and gait problem.  Skin: Negative for rash.  Neurological: Negative for dizziness, syncope, light-headedness and headaches.  All other systems reviewed and are negative.   Per HPI unless specifically indicated above     Medication List       This list is accurate as of: 03/13/16 11:36  AM.  Always use your most recent med list.               acetaminophen 500 MG tablet  Commonly known as:  TYLENOL  Take 500 mg by mouth every 6 (six) hours as needed. Pain     ALPRAZolam 1 MG tablet  Commonly known as:  XANAX  Take 1 tablet (1 mg total) by mouth 3 (three) times daily as needed for anxiety.     aspirin EC 81 MG tablet  Take 81 mg by mouth daily.     atorvastatin 20 MG tablet  Commonly known as:  LIPITOR  Take 1 tablet (20 mg total) by mouth daily.     buPROPion 300 MG 24 hr tablet  Commonly known as:  WELLBUTRIN XL  Take 1 tablet (300 mg total) by mouth every morning.     carvedilol 12.5 MG tablet  Commonly known as:  COREG  Take 1 tablet (12.5 mg total) by mouth 2 (two) times daily with a meal.     esomeprazole 20 MG capsule  Commonly known as:  NEXIUM 24HR  Take 1 capsule (20 mg total) by mouth daily at 12 noon.     metFORMIN 1000 MG tablet  Commonly known as:  GLUCOPHAGE  Take 1 tablet (1,000 mg total) by mouth 2 (two) times daily with a meal.     mycophenolate 500 MG tablet  Commonly known as:  CELLCEPT  Take 500-1,000 mg by mouth 2 (two) times daily. Patient takes 1 tablet (500mg ) in the morning and 2 tablets(1000mg )  PROBIOTIC DAILY PO  Take by mouth daily.     ramipril 2.5 MG capsule  Commonly known as:  ALTACE  Take 2.5 mg by mouth daily.     tacrolimus 1 MG capsule  Commonly known as:  PROGRAF  Take 1 mg by mouth. 1 mg in the am and 0.5 mg in the pm     Vilazodone HCl 10 MG Tabs  Commonly known as:  VIIBRYD  Take 1 tablet (10 mg total) by mouth daily.           Objective:    BP 131/87 mmHg  Pulse 88  Temp(Src) 97 F (36.1 C) (Oral)  Ht 5\' 8"  (1.727 m)  Wt 155 lb (70.308 kg)  BMI 23.57 kg/m2  Wt Readings from Last 3 Encounters:  03/13/16 155 lb (70.308 kg)  02/26/16 153 lb 9.6 oz (69.673 kg)  12/13/15 155 lb 9.6 oz (70.58 kg)    Physical Exam  Constitutional: He is oriented to person, place, and time. He appears  well-developed and well-nourished. No distress.  Eyes: Conjunctivae and EOM are normal. Pupils are equal, round, and reactive to light. Right eye exhibits no discharge. No scleral icterus.  Neck: Neck supple. No thyromegaly present.  Cardiovascular: Normal rate, regular rhythm, normal heart sounds and intact distal pulses.   No murmur heard. Pulmonary/Chest: Effort normal and breath sounds normal. No respiratory distress. He has no wheezes.  Musculoskeletal: Normal range of motion. He exhibits no edema.  Lymphadenopathy:    He has no cervical adenopathy.  Neurological: He is alert and oriented to person, place, and time. Coordination normal.  Skin: Skin is warm and dry. No rash noted. He is not diaphoretic.  Psychiatric: He has a normal mood and affect. His behavior is normal.  Vitals reviewed.   Results for orders placed or performed in visit on 12/13/15  POCT Influenza A/B  Result Value Ref Range   Influenza A, POC Negative Negative   Influenza B, POC Negative Negative      Assessment & Plan:       Problem List Items Addressed This Visit      Cardiovascular and Mediastinum   HTN (hypertension) - Primary   Relevant Orders   Microalbumin / creatinine urine ratio     Endocrine   Diabetes type 2, controlled ([place])   Relevant Medications   metFORMIN (GLUCOPHAGE) 1000 MG tablet   Other Relevant Orders   Microalbumin / creatinine urine ratio      Follow up plan: Return in about 3 months (around 06/13/2016), or if symptoms worsen or fail to improve, for diabetes and htn.  Counseling provided for all of the vaccine components Orders Placed This Encounter  Procedures  . Microalbumin / creatinine urine ratio    Caryl Pina, MD [facility] 03/13/2016, 11:36 AM

## 2016-03-14 LAB — MICROALBUMIN / CREATININE URINE RATIO
CREATININE, UR: 57.5 mg/dL
MICROALB/CREAT RATIO: 51.5 mg/g creat — ABNORMAL HIGH (ref 0.0–30.0)
Microalbumin, Urine: 29.6 ug/mL

## 2016-03-18 ENCOUNTER — Telehealth: Payer: Self-pay | Admitting: Family Medicine

## 2016-03-19 NOTE — Telephone Encounter (Signed)
Spoke to pt

## 2016-03-28 ENCOUNTER — Encounter: Payer: Self-pay | Admitting: *Deleted

## 2016-04-17 ENCOUNTER — Ambulatory Visit (INDEPENDENT_AMBULATORY_CARE_PROVIDER_SITE_OTHER): Payer: Medicaid Other | Admitting: Gastroenterology

## 2016-04-17 ENCOUNTER — Encounter: Payer: Self-pay | Admitting: Gastroenterology

## 2016-04-17 VITALS — BP 128/62 | HR 96 | Temp 97.6°F | Ht 68.0 in | Wt 153.0 lb

## 2016-04-17 DIAGNOSIS — K219 Gastro-esophageal reflux disease without esophagitis: Secondary | ICD-10-CM | POA: Diagnosis not present

## 2016-04-17 DIAGNOSIS — R1013 Epigastric pain: Secondary | ICD-10-CM

## 2016-04-17 MED ORDER — ESOMEPRAZOLE MAGNESIUM 20 MG PO CPDR: 20.0000 mg | DELAYED_RELEASE_CAPSULE | Freq: Two times a day (BID) | ORAL | Status: DC

## 2016-04-17 NOTE — Progress Notes (Signed)
cc'ed to pcp °

## 2016-04-17 NOTE — Patient Instructions (Signed)
   Increase Nexium to twice daily, 30 minutes before a meal.   Return to the office in two months to reevaluate or call sooner if symptoms worsen.   Please follow diet below. You do not have Peptic Ulcer Disease but some of foods may aggravate your symptoms.   Food Choices   When you have peptic ulcer disease, the foods you eat and your eating habits are very important. Choosing the right foods can help ease the discomfort of peptic ulcer disease. WHAT GENERAL GUIDELINES DO I NEED TO FOLLOW?  Choose fruits, vegetables, whole grains, and low-fat meat, fish, and poultry.   Keep a food diary to identify foods that cause symptoms.  Avoid foods that cause irritation or pain. These may be different for different people.  Eat frequent small meals instead of three large meals each day. The pain may be worse when your stomach is empty.  Avoid eating close to bedtime. WHAT FOODS ARE NOT RECOMMENDED? The following are some foods and drinks that may worsen your symptoms:  Black, white, and red pepper.  Hot sauce.  Chili peppers.  Chili powder.  Chocolate and cocoa.   Alcohol.  Tea, coffee, and cola (regular and decaffeinated). The items listed above may not be a complete list of foods and beverages to avoid. Contact your dietitian for more information.   This information is not intended to replace advice given to you by your health care provider. Make sure you discuss any questions you have with your health care provider.   Document Released: 01/05/2012 Document Revised: 10/18/2013 Document Reviewed: 08/17/2013 Elsevier Interactive Patient Education [employer].

## 2016-04-17 NOTE — Assessment & Plan Note (Signed)
Continues to have dyspepsia type symptoms related to anxiety and certain foods. Mild epigastric tenderness on exam. Symptoms essentially unchanged over several years per patient. Same as it was when he underwent upper endoscopy. Feels like metformin may be contributing to his symptoms especially since he had to double the dose. Doubt symptoms related to small bowel bacterial overgrowth. Given his history of C. difficile, avoid antibiotics as much as possible.  Increase Nexium to 20 mg twice daily before meals. Avoid foods that aggravate peptic ulcer disease, handout provided. Return in 2 months to see Dr. Oneida Alar or sooner if needed.
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
## 2016-04-25 ENCOUNTER — Other Ambulatory Visit (HOSPITAL_COMMUNITY): Payer: Self-pay | Admitting: Psychiatry

## 2016-04-28 ENCOUNTER — Telehealth (HOSPITAL_COMMUNITY): Payer: Self-pay | Admitting: *Deleted

## 2016-04-28 ENCOUNTER — Other Ambulatory Visit (HOSPITAL_COMMUNITY): Payer: Self-pay | Admitting: Psychiatry

## 2016-04-28 NOTE — Telephone Encounter (Signed)
Opened in Error.

## 2016-05-01 ENCOUNTER — Telehealth (HOSPITAL_COMMUNITY): Payer: Self-pay | Admitting: *Deleted

## 2016-05-01 NOTE — Telephone Encounter (Signed)
Prior authorization for Viibryd received. Called [place] [facility] spoke with Webb who states it will be sent for review due to only trying and failing 1 preferred drug (Wellbutrin). Patient needed to fail 2 preferred drugs for authorization. Explained he has been taking since 2015 with positive results in therapy. Decision will be faxed.

## 2016-05-01 NOTE — Telephone Encounter (Signed)
noted 

## 2016-05-26 ENCOUNTER — Ambulatory Visit (INDEPENDENT_AMBULATORY_CARE_PROVIDER_SITE_OTHER): Payer: Medicaid Other | Admitting: Psychiatry

## 2016-05-26 ENCOUNTER — Encounter (HOSPITAL_COMMUNITY): Payer: Self-pay | Admitting: Psychiatry

## 2016-05-26 VITALS — BP 135/86 | HR 96 | Ht 68.0 in | Wt 148.0 lb

## 2016-05-26 DIAGNOSIS — F329 Major depressive disorder, single episode, unspecified: Secondary | ICD-10-CM | POA: Diagnosis not present

## 2016-05-26 DIAGNOSIS — F32A Depression, unspecified: Secondary | ICD-10-CM

## 2016-05-26 DIAGNOSIS — F419 Anxiety disorder, unspecified: Secondary | ICD-10-CM | POA: Diagnosis not present

## 2016-05-26 MED ORDER — VILAZODONE HCL 10 MG PO TABS: 10.0000 mg | ORAL_TABLET | Freq: Every day | ORAL | 2 refills | Status: DC

## 2016-05-26 MED ORDER — BUPROPION HCL ER (XL) 300 MG PO TB24: 300.0000 mg | ORAL_TABLET | ORAL | 2 refills | Status: DC

## 2016-05-26 MED ORDER — ALPRAZOLAM 1 MG PO TABS: 1.0000 mg | ORAL_TABLET | Freq: Three times a day (TID) | ORAL | 2 refills | Status: DC | PRN
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
## 2016-06-17 ENCOUNTER — Ambulatory Visit (INDEPENDENT_AMBULATORY_CARE_PROVIDER_SITE_OTHER): Payer: Medicaid Other | Admitting: Family Medicine

## 2016-06-17 ENCOUNTER — Encounter: Payer: Self-pay | Admitting: Family Medicine

## 2016-06-17 VITALS — BP 137/86 | HR 92 | Temp 97.7°F | Ht 68.0 in | Wt 147.4 lb

## 2016-06-17 DIAGNOSIS — E1122 Type 2 diabetes mellitus with diabetic chronic kidney disease: Secondary | ICD-10-CM | POA: Diagnosis not present

## 2016-06-17 DIAGNOSIS — N529 Male erectile dysfunction, unspecified: Secondary | ICD-10-CM

## 2016-06-17 DIAGNOSIS — I10 Essential (primary) hypertension: Secondary | ICD-10-CM

## 2016-06-17 DIAGNOSIS — N182 Chronic kidney disease, stage 2 (mild): Secondary | ICD-10-CM | POA: Diagnosis not present

## 2016-06-17 LAB — BAYER DCA HB A1C WAIVED: HB A1C: 5.7 % (ref <7.0)

## 2016-06-17 MED ORDER — SILDENAFIL CITRATE 20 MG PO TABS
20.0000 mg | ORAL_TABLET | Freq: Every day | ORAL | 0 refills | Status: DC | PRN
Start: 2016-06-17 — End: 2018-08-16

## 2016-06-17 NOTE — Progress Notes (Signed)
BP 137/86 (BP Location: Left Arm, Patient Position: Sitting, Cuff Size: Normal)   Pulse 92   Temp 97.7 F (36.5 C) (Oral)   Ht 5\' 8"  (1.727 m)   Wt 147 lb 6.4 oz (66.9 kg)   BMI 22.41 kg/m    Subjective:    Patient ID: Anthony Price, male    DOB: Aug 23, 1956, 60 y.o.   MRN: [id number]  HPI: Anthony Price is a 60 y.o. male presenting on 06/17/2016 for Diabetes (3 month followup; patient was seen for his followup at [facility] on 05/28/16, had heart cath at that time) and Hypertension   HPI Type 2 diabetes Patient has type 2 diabetes and is coming in for recheck of that. Patient is currently taking 500 mg of metformin in the morning and 1000 evening. This was due to tolerability. Patient is currently on an ACE inhibitor. Patient is also currently on a statin. Patient had his ophthalmology exam of right 10 2017 was normal.  Hypertension follow-up Patient is coming in today for a hypertension follow-up. His blood pressures 137/86. Patient is currently on ramipril and Coreg for his blood pressure. Patient denies headaches, blurred vision, chest pains, shortness of breath, or weakness. Denies any side effects from medication and is content with current medication.   Erectile dysfunction At patient's last visit he had complaints of erectile dysfunction. He says he is only sexually active 5-6 times a year but he has been having trouble getting erections when he is sexually active. He also has trouble maintaining erections. At his last check it was discussed with him that he should discuss the possibility of going on with these medications with his cardiologist over at [facility]. Most recently he had a cardiac catheterization on 05/28/2016 and per patient they found some minor blockages but nothing major and he shows me a message on line that says that the cardiology team is okay with him going on a medication for erectile dysfunction from a heart standpoint. He denies any issues with urinary stream or  waking up to urinate multiple times a night.  Relevant past medical, surgical, family and social history reviewed and updated as indicated. Interim medical history since our last visit reviewed. Allergies and medications reviewed and updated.  Review of Systems  Constitutional: Negative for chills and fever.  HENT: Negative for ear discharge and ear pain.   Eyes: Negative for discharge and visual disturbance.  Respiratory: Negative for shortness of breath and wheezing.   Cardiovascular: Negative for chest pain and leg swelling.  Gastrointestinal: Negative for abdominal pain, constipation and diarrhea.  Genitourinary: Negative for decreased urine volume, difficulty urinating, discharge, flank pain, hematuria, penile pain, penile swelling, scrotal swelling, testicular pain and urgency.  Musculoskeletal: Negative for back pain and gait problem.  Skin: Negative for rash.  Neurological: Negative for syncope, light-headedness and headaches.  All other systems reviewed and are negative.   Per HPI unless specifically indicated above     Medication List       Accurate as of 06/17/16 11:57 AM. Always use your most recent med list.          acetaminophen 500 MG tablet Commonly known as:  TYLENOL Take 500 mg by mouth every 6 (six) hours as needed. Pain   ALPRAZolam 1 MG tablet Commonly known as:  XANAX Take 1 tablet (1 mg total) by mouth 3 (three) times daily as needed for anxiety.   aspirin EC 81 MG tablet Take 81 mg by mouth daily.  atorvastatin 20 MG tablet Commonly known as:  LIPITOR Take 1 tablet (20 mg total) by mouth daily.   buPROPion 300 MG 24 hr tablet Commonly known as:  WELLBUTRIN XL Take 1 tablet (300 mg total) by mouth every morning.   carvedilol 12.5 MG tablet Commonly known as:  COREG Take 1 tablet (12.5 mg total) by mouth 2 (two) times daily with a meal.   esomeprazole 20 MG capsule Commonly known as:  NEXIUM 24HR Take 1 capsule (20 mg total) by mouth 2  (two) times daily before a meal.   metFORMIN 1000 MG tablet Commonly known as:  GLUCOPHAGE Take 1 tablet (1,000 mg total) by mouth 2 (two) times daily with a meal.   mycophenolate 500 MG tablet Commonly known as:  CELLCEPT Take 500-1,000 mg by mouth 2 (two) times daily. Patient takes 1 tablet (500mg ) in the morning and 2 tablets(1000mg )   PROBIOTIC DAILY PO Take by mouth daily.   ramipril 2.5 MG capsule Commonly known as:  ALTACE Take 2.5 mg by mouth daily.   sildenafil 20 MG tablet Commonly known as:  REVATIO Take 1 tablet (20 mg total) by mouth daily as needed.   tacrolimus 1 MG capsule Commonly known as:  PROGRAF Take 1 mg by mouth. 1 mg in the am and 0.5 mg in the pm   Vilazodone HCl 10 MG Tabs Commonly known as:  VIIBRYD Take 1 tablet (10 mg total) by mouth daily.          Objective:    BP 137/86 (BP Location: Left Arm, Patient Position: Sitting, Cuff Size: Normal)   Pulse 92   Temp 97.7 F (36.5 C) (Oral)   Ht 5\' 8"  (1.727 m)   Wt 147 lb 6.4 oz (66.9 kg)   BMI 22.41 kg/m   Wt Readings from Last 3 Encounters:  06/17/16 147 lb 6.4 oz (66.9 kg)  04/17/16 153 lb (69.4 kg)  03/13/16 155 lb (70.3 kg)    Physical Exam  Constitutional: He is oriented to person, place, and time. He appears well-developed and well-nourished. No distress.  Eyes: Conjunctivae and EOM are normal. Pupils are equal, round, and reactive to light. Right eye exhibits no discharge. No scleral icterus.  Neck: Neck supple. No thyromegaly present.  Cardiovascular: Normal rate, regular rhythm, normal heart sounds and intact distal pulses.   No murmur heard. Pulmonary/Chest: Effort normal and breath sounds normal. No respiratory distress. He has no wheezes.  Musculoskeletal: Normal range of motion. He exhibits no edema.  Lymphadenopathy:    He has no cervical adenopathy.  Neurological: He is alert and oriented to person, place, and time. Coordination normal.  Skin: Skin is warm and dry. No  rash noted. He is not diaphoretic.  Psychiatric: He has a normal mood and affect. His behavior is normal.  Nursing note and vitals reviewed.   Results for orders placed or performed in visit on 03/28/16  HM DIABETES EYE EXAM  Result Value Ref Range   HM Diabetic Eye Exam No Retinopathy No Retinopathy      Assessment & Plan:   Problem List Items Addressed This Visit      Cardiovascular and Mediastinum   HTN (hypertension) - Primary   Relevant Medications   sildenafil (REVATIO) 20 MG tablet     Endocrine   Diabetes type 2, controlled ([place])   Relevant Orders   Bayer DCA Hb A1c Waived (Completed)    Other Visit Diagnoses    Erectile dysfunction, unspecified erectile dysfunction type  Relevant Medications   sildenafil (REVATIO) 20 MG tablet       Follow up plan: Return in about 3 months (around 09/17/2016), or if symptoms worsen or fail to improve.  Counseling provided for all of the vaccine components Orders Placed This Encounter  Procedures  . Bayer [facility] Hb A1c [place], MD [facility] 06/17/2016, 11:57 AM

## 2016-06-18 ENCOUNTER — Ambulatory Visit (INDEPENDENT_AMBULATORY_CARE_PROVIDER_SITE_OTHER): Payer: Medicaid Other | Admitting: Gastroenterology

## 2016-06-18 ENCOUNTER — Encounter: Payer: Self-pay | Admitting: Gastroenterology

## 2016-06-18 ENCOUNTER — Encounter: Payer: Self-pay | Admitting: Family Medicine

## 2016-06-18 DIAGNOSIS — D369 Benign neoplasm, unspecified site: Secondary | ICD-10-CM | POA: Diagnosis not present

## 2016-06-18 DIAGNOSIS — K6389 Other specified diseases of intestine: Secondary | ICD-10-CM | POA: Diagnosis not present

## 2016-06-18 DIAGNOSIS — K219 Gastro-esophageal reflux disease without esophagitis: Secondary | ICD-10-CM

## 2016-06-18 NOTE — Patient Instructions (Addendum)
DRINK WATER TO KEEP YOUR URINE LIGHT YELLOW.  Avoid reflux triggers IF YOU CAN.  CONTINUE NEXIUM. TAKE 30 MINUTES BEFORE MEALS ONCE OR TWICE A DAY.  CONTINUE YOUR PROBIOTIC DAILY.  YOU NEED A COLONOSCOPY WITHIN THE NEXT 6 MOS.   FOLLOW UP IN 1 YEAR.

## 2016-06-18 NOTE — Progress Notes (Signed)
cc'ed to pcp °

## 2016-06-18 NOTE — Assessment & Plan Note (Signed)
SYMPTOMS FAIRLY WELL CONTROLLED.  AVOID TRIGGERS CONTINUE NEXIUM. TAKE 30 MINUTES BEFORE MEALS ONCE OR TWICE DAILY. FOLLOW UP IN 1 YEAR.
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
## 2016-06-18 NOTE — Assessment & Plan Note (Signed)
SYMPTOMS FAIRLY WELL CONTROLLED.  CONTINUE TO MONITOR SYMPTOMS. CALL WITH QUESTIONS OR CONCERNS. CONTINUE YOUR PROBIOTIC DAILY. FOLLOW UP IN 1 YEAR.

## 2016-06-18 NOTE — Progress Notes (Signed)
ON RECALL  °

## 2016-06-18 NOTE — Assessment & Plan Note (Signed)
REVIEWED TCS REPORT FROM 2013: > 3 SIMPLE ADENOMAS REMOVED. PT PAST DUE FOR TCS.  RECOMMEND TCS WITHIN THE NEXT 3-6 MOS. PT DECLINES TO SCHEDULE AT THIS MOMENT. WILL CONTACT IN SEP 2017.

## 2016-06-19 ENCOUNTER — Other Ambulatory Visit: Payer: Self-pay | Admitting: Family Medicine

## 2016-06-26 ENCOUNTER — Other Ambulatory Visit: Payer: Self-pay | Admitting: Family Medicine

## 2016-06-26 DIAGNOSIS — I10 Essential (primary) hypertension: Secondary | ICD-10-CM

## 2016-08-25 ENCOUNTER — Other Ambulatory Visit: Payer: Self-pay | Admitting: Family Medicine

## 2016-08-25 ENCOUNTER — Other Ambulatory Visit (HOSPITAL_COMMUNITY): Payer: Self-pay | Admitting: Psychiatry

## 2016-08-25 DIAGNOSIS — F32A Depression, unspecified: Secondary | ICD-10-CM

## 2016-08-25 DIAGNOSIS — F329 Major depressive disorder, single episode, unspecified: Secondary | ICD-10-CM

## 2016-08-25 DIAGNOSIS — I10 Essential (primary) hypertension: Secondary | ICD-10-CM

## 2016-08-26 ENCOUNTER — Ambulatory Visit (INDEPENDENT_AMBULATORY_CARE_PROVIDER_SITE_OTHER): Payer: Medicaid Other | Admitting: Psychiatry

## 2016-08-26 ENCOUNTER — Encounter (HOSPITAL_COMMUNITY): Payer: Self-pay | Admitting: Psychiatry

## 2016-08-26 VITALS — BP 140/83 | HR 88 | Ht 68.0 in | Wt 147.0 lb

## 2016-08-26 DIAGNOSIS — F321 Major depressive disorder, single episode, moderate: Secondary | ICD-10-CM | POA: Diagnosis not present

## 2016-08-26 MED ORDER — VILAZODONE HCL 10 MG PO TABS: 10.0000 mg | ORAL_TABLET | Freq: Every day | ORAL | 2 refills | Status: DC

## 2016-08-26 MED ORDER — BUPROPION HCL ER (XL) 300 MG PO TB24: 300.0000 mg | ORAL_TABLET | Freq: Every morning | ORAL | 2 refills | Status: DC

## 2016-08-26 MED ORDER — ALPRAZOLAM 1 MG PO TABS: 1.0000 mg | ORAL_TABLET | Freq: Three times a day (TID) | ORAL | 2 refills | Status: DC | PRN
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
## 2016-09-22 ENCOUNTER — Ambulatory Visit (INDEPENDENT_AMBULATORY_CARE_PROVIDER_SITE_OTHER): Payer: Medicaid Other | Admitting: Family Medicine

## 2016-09-22 ENCOUNTER — Encounter: Payer: Self-pay | Admitting: Family Medicine

## 2016-09-22 VITALS — BP 130/82 | HR 92 | Temp 97.1°F | Ht 68.0 in | Wt 150.4 lb

## 2016-09-22 DIAGNOSIS — E1122 Type 2 diabetes mellitus with diabetic chronic kidney disease: Secondary | ICD-10-CM | POA: Diagnosis not present

## 2016-09-22 DIAGNOSIS — I10 Essential (primary) hypertension: Secondary | ICD-10-CM

## 2016-09-22 DIAGNOSIS — N529 Male erectile dysfunction, unspecified: Secondary | ICD-10-CM | POA: Diagnosis not present

## 2016-09-22 DIAGNOSIS — N182 Chronic kidney disease, stage 2 (mild): Secondary | ICD-10-CM

## 2016-09-22 DIAGNOSIS — Z23 Encounter for immunization: Secondary | ICD-10-CM

## 2016-09-22 LAB — BAYER DCA HB A1C WAIVED: HB A1C: 5.8 % (ref <7.0)

## 2016-09-22 MED ORDER — SILDENAFIL CITRATE 50 MG PO TABS: 50.0000 mg | ORAL_TABLET | Freq: Every day | ORAL | 0 refills | Status: DC | PRN

## 2016-09-22 NOTE — Progress Notes (Signed)
BP 130/82   Pulse 92   Temp 97.1 F (36.2 C) (Oral)   Ht 5\' 8"  (1.727 m)   Wt 150 lb 6 oz (68.2 kg)   BMI 22.86 kg/m    Subjective:    Patient ID: Anthony Price, male    DOB: 1956/06/29, 60 y.o.   MRN: [id number]  HPI: Anthony Price is a 60 y.o. male presenting on 09/22/2016 for Diabetes (followup) and Hypertension   HPI Hypertension recheck Patient is coming today for hypertension recheck. His blood pressure today is 130/82. He still gets occasionally dizzy when he stands up quickly but not like he was before. He is currently on amlodipine 2.5 mg daily and Coreg 12.5 mg twice a day. Patient denies headaches, blurred vision, chest pains, shortness of breath, or weakness. Denies any side effects from medication and is content with current medication.   Type 2 diabetes Patient is coming in for recheck on his type 2 diabetes. On his last visit his hemoglobin A1c was 5.7 and we reduced his metformin from 1000 twice a day to 500 mg twice a day. This was about 3 or 4 months ago. He saw an ophthalmologist in October 2017. He denies any issues with his feet. He says his blood sugars are running pretty good and he is doing dietary changes to maintain good numbers. He does have known renal disease, likely secondary to long-term immunosuppressives for his cardiac transplant. It has been stable and they're monitoring it. He will get labs for the kidney disease checked with his next labs for hematology oncology.  Erectile dysfunction Patient is coming in today for recheck on his rectal dysfunction. He says that he tried the sildenafil 20 mg and felt like he was taking candy because they did not do anything for him. He is still having the same issues with getting an erection and keeping an erection. It has not improved at all. The last time he came he did have a letter from his cardiologist of proving that he could safely take sildenafil.  Relevant past medical, surgical, family and social  history reviewed and updated as indicated. Interim medical history since our last visit reviewed. Allergies and medications reviewed and updated.  Review of Systems  Constitutional: Negative for chills and fever.  Respiratory: Negative for shortness of breath and wheezing.   Cardiovascular: Negative for chest pain and leg swelling.  Genitourinary: Negative for flank pain, penile pain, penile swelling, scrotal swelling and testicular pain.  Musculoskeletal: Negative for back pain and gait problem.  Skin: Negative for rash.  Neurological: Negative for dizziness, weakness and light-headedness.  All other systems reviewed and are negative.   Per HPI unless specifically indicated above      Objective:    BP 130/82   Pulse 92   Temp 97.1 F (36.2 C) (Oral)   Ht 5\' 8"  (1.727 m)   Wt 150 lb 6 oz (68.2 kg)   BMI 22.86 kg/m   Wt Readings from Last 3 Encounters:  09/22/16 150 lb 6 oz (68.2 kg)  06/18/16 145 lb 12.8 oz (66.1 kg)  06/17/16 147 lb 6.4 oz (66.9 kg)    Physical Exam  Constitutional: He is oriented to person, place, and time. He appears well-developed and well-nourished. No distress.  Eyes: Conjunctivae are normal. Right eye exhibits no discharge. Left eye exhibits no discharge. No scleral icterus.  Cardiovascular: Normal rate, regular rhythm, normal heart sounds and intact distal pulses.   No murmur heard. Pulmonary/Chest: Effort  normal and breath sounds normal. No respiratory distress. He has no wheezes. He has no rales.  Musculoskeletal: Normal range of motion. He exhibits no edema.  Neurological: He is alert and oriented to person, place, and time. Coordination normal.  Skin: Skin is warm and dry. No rash noted. He is not diaphoretic.  Psychiatric: He has a normal mood and affect. His behavior is normal.  Nursing note and vitals reviewed.   Results for orders placed or performed in visit on 06/17/16  Bayer DCA Hb A1c Waived  Result Value Ref Range   Bayer DCA Hb  A1c Waived 5.7 <7.0 %      Assessment & Plan:   Problem List Items Addressed This Visit      Cardiovascular and Mediastinum   HTN (hypertension) - Primary   Relevant Medications   atorvastatin (LIPITOR) 40 MG tablet   sildenafil (VIAGRA) 50 MG tablet     Endocrine   Diabetes type 2, controlled (HCC)   Relevant Medications   atorvastatin (LIPITOR) 40 MG tablet   Other Relevant Orders   Bayer DCA Hb A1c Waived    Other Visit Diagnoses    Erectile dysfunction, unspecified erectile dysfunction type       Relevant Medications   sildenafil (VIAGRA) 50 MG tablet       Follow up plan: Return in about 6 months (around 03/22/2017), or if symptoms worsen or fail to improve, for Diabetes and hypertension recheck.  Counseling provided for all of the vaccine components No orders of the defined types were placed in this encounter.   Caryl Pina, MD [facility] 09/22/2016, 11:39 AM

## 2016-09-22 NOTE — Addendum Note (Signed)
Addended by: Michaela Corner on: 09/22/2016 01:41 PM   Modules accepted: Orders

## 2016-09-24 ENCOUNTER — Other Ambulatory Visit: Payer: Self-pay | Admitting: Family Medicine

## 2016-09-24 DIAGNOSIS — I10 Essential (primary) hypertension: Secondary | ICD-10-CM

## 2016-10-03 ENCOUNTER — Other Ambulatory Visit: Payer: Self-pay | Admitting: Gastroenterology

## 2016-10-22 ENCOUNTER — Encounter: Payer: Self-pay | Admitting: Gastroenterology

## 2016-11-26 ENCOUNTER — Ambulatory Visit (INDEPENDENT_AMBULATORY_CARE_PROVIDER_SITE_OTHER): Payer: Medicaid Other | Admitting: Psychiatry

## 2016-11-26 ENCOUNTER — Encounter (HOSPITAL_COMMUNITY): Payer: Self-pay | Admitting: Psychiatry

## 2016-11-26 VITALS — BP 157/94 | HR 93 | Ht 68.0 in | Wt 150.0 lb

## 2016-11-26 DIAGNOSIS — Z91011 Allergy to milk products: Secondary | ICD-10-CM | POA: Diagnosis not present

## 2016-11-26 DIAGNOSIS — Z79899 Other long term (current) drug therapy: Secondary | ICD-10-CM | POA: Diagnosis not present

## 2016-11-26 DIAGNOSIS — F321 Major depressive disorder, single episode, moderate: Secondary | ICD-10-CM

## 2016-11-26 MED ORDER — VILAZODONE HCL 10 MG PO TABS: 10.0000 mg | ORAL_TABLET | Freq: Every day | ORAL | 2 refills | Status: DC

## 2016-11-26 MED ORDER — BUPROPION HCL ER (XL) 300 MG PO TB24: 300.0000 mg | ORAL_TABLET | Freq: Every morning | ORAL | 2 refills | Status: DC

## 2016-11-26 MED ORDER — ALPRAZOLAM 1 MG PO TABS: 1.0000 mg | ORAL_TABLET | Freq: Three times a day (TID) | ORAL | 2 refills | Status: DC | PRN
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
## 2016-12-23 ENCOUNTER — Other Ambulatory Visit: Payer: Self-pay | Admitting: Family Medicine

## 2016-12-23 DIAGNOSIS — I10 Essential (primary) hypertension: Secondary | ICD-10-CM

## 2017-01-13 DIAGNOSIS — Z941 Heart transplant status: Secondary | ICD-10-CM | POA: Diagnosis not present

## 2017-01-21 ENCOUNTER — Ambulatory Visit (INDEPENDENT_AMBULATORY_CARE_PROVIDER_SITE_OTHER): Payer: Medicaid Other | Admitting: Family Medicine

## 2017-01-21 ENCOUNTER — Encounter: Payer: Self-pay | Admitting: Family Medicine

## 2017-01-21 VITALS — BP 130/84 | HR 90 | Temp 98.1°F | Ht 68.0 in | Wt 151.0 lb

## 2017-01-21 DIAGNOSIS — N529 Male erectile dysfunction, unspecified: Secondary | ICD-10-CM | POA: Diagnosis not present

## 2017-01-21 DIAGNOSIS — N182 Chronic kidney disease, stage 2 (mild): Secondary | ICD-10-CM

## 2017-01-21 DIAGNOSIS — Z114 Encounter for screening for human immunodeficiency virus [HIV]: Secondary | ICD-10-CM

## 2017-01-21 DIAGNOSIS — I10 Essential (primary) hypertension: Secondary | ICD-10-CM

## 2017-01-21 DIAGNOSIS — E1122 Type 2 diabetes mellitus with diabetic chronic kidney disease: Secondary | ICD-10-CM | POA: Diagnosis not present

## 2017-01-21 DIAGNOSIS — Z1159 Encounter for screening for other viral diseases: Secondary | ICD-10-CM | POA: Diagnosis not present

## 2017-01-21 NOTE — Progress Notes (Signed)
BP 130/84   Pulse 90   Temp 98.1 F (36.7 C) (Oral)   Ht 5\' 8"  (1.727 m)   Wt 151 lb (68.5 kg)   BMI 22.96 kg/m    Subjective:    Patient ID: Anthony Price, male    DOB: 17-Mar-1956, 61 y.o.   MRN: [id number]  HPI: Anthony Price is a 61 y.o. male presenting on 01/21/2017 for Diabetes (followup) and Hypertension   HPI Hypertension recheck Patient is coming today for hypertension recheck. His blood pressure today is 130/84. He is currently on amlodipine 2.5 mg daily and Coreg 12.5 mg twice a day. Patient denies headaches, blurred vision, chest pains, shortness of breath, or weakness. Denies any side effects from medication and is content with current medication.   Type 2 diabetes Patient is coming in for recheck on his type 2 diabetes. On his last visit his hemoglobin A1c was 6.0 Over with his nephrologist in [facility] last week. He saw an ophthalmologist in October 2017. He denies any issues with his feet. He says his blood sugars are running pretty good and he is doing dietary changes to maintain good numbers. He does have known renal disease, likely secondary to long-term immunosuppressives for his cardiac transplant. It has been stable and they're monitoring it. He will get labs for the kidney disease checked with his next labs for hematology oncology. The renal disease did improve on the last numbers.  Erectile dysfunction Patient is coming in today for recheck on his rectal dysfunction. He is doing better now and rarely uses the Viagra or Cialis.  Relevant past medical, surgical, family and social history reviewed and updated as indicated. Interim medical history since our last visit reviewed. Allergies and medications reviewed and updated.  Review of Systems  Constitutional: Negative for chills and fever.  Respiratory: Negative for wheezing.   Cardiovascular: Negative for leg swelling.  Genitourinary: Negative for flank pain, penile pain, penile swelling, scrotal swelling and  testicular pain.  Musculoskeletal: Negative for back pain and gait problem.  Skin: Negative for rash.  Neurological: Negative for light-headedness.  All other systems reviewed and are negative.   Per HPI unless specifically indicated above      Objective:    BP 130/84   Pulse 90   Temp 98.1 F (36.7 C) (Oral)   Ht 5\' 8"  (1.727 m)   Wt 151 lb (68.5 kg)   BMI 22.96 kg/m   Wt Readings from Last 3 Encounters:  01/21/17 151 lb (68.5 kg)  09/22/16 150 lb 6 oz (68.2 kg)  06/18/16 145 lb 12.8 oz (66.1 kg)    Physical Exam  Constitutional: He is oriented to person, place, and time. He appears well-developed and well-nourished. No distress.  Eyes: Conjunctivae are normal. Right eye exhibits no discharge. Left eye exhibits no discharge. No scleral icterus.  Cardiovascular: Normal rate, regular rhythm, normal heart sounds and intact distal pulses.   No murmur heard. Pulmonary/Chest: Effort normal and breath sounds normal. No respiratory distress. He has no wheezes. He has no rales.  Musculoskeletal: Normal range of motion. He exhibits no edema.  Neurological: He is alert and oriented to person, place, and time. Coordination normal.  Skin: Skin is warm and dry. No rash noted. He is not diaphoretic.  Psychiatric: He has a normal mood and affect. His behavior is normal.  Nursing note and vitals reviewed.       Assessment & Plan:   Problem List Items Addressed This Visit  Cardiovascular and Mediastinum   HTN (hypertension) - Primary   Relevant Medications   amLODipine (NORVASC) 2.5 MG tablet     Endocrine   Diabetes type 2, controlled ([place])   Relevant Medications   metFORMIN (GLUCOPHAGE) 500 MG tablet    Other Visit Diagnoses    Need for hepatitis C screening test       Encounter for screening for HIV       Erectile dysfunction, unspecified erectile dysfunction type       Patient rarely using medication, doing okay with it       Follow up plan: Return in about 6  months (around 07/24/2017), or if symptoms worsen or fail to improve, for Diabetes and hypertension.  Counseling provided for all of the vaccine components No orders of the defined types were placed in this encounter.   Caryl Pina, MD [facility] 01/21/2017, 11:49 AM

## 2017-01-26 ENCOUNTER — Other Ambulatory Visit: Payer: Self-pay | Admitting: Family Medicine

## 2017-01-26 DIAGNOSIS — I10 Essential (primary) hypertension: Secondary | ICD-10-CM

## 2017-02-17 ENCOUNTER — Ambulatory Visit (INDEPENDENT_AMBULATORY_CARE_PROVIDER_SITE_OTHER): Payer: Medicaid Other | Admitting: Psychiatry

## 2017-02-17 ENCOUNTER — Encounter (HOSPITAL_COMMUNITY): Payer: Self-pay | Admitting: Psychiatry

## 2017-02-17 VITALS — BP 109/71 | HR 90 | Ht 68.0 in | Wt 151.4 lb

## 2017-02-17 DIAGNOSIS — F321 Major depressive disorder, single episode, moderate: Secondary | ICD-10-CM | POA: Diagnosis not present

## 2017-02-17 DIAGNOSIS — Z79899 Other long term (current) drug therapy: Secondary | ICD-10-CM

## 2017-02-17 DIAGNOSIS — Z818 Family history of other mental and behavioral disorders: Secondary | ICD-10-CM | POA: Diagnosis not present

## 2017-02-17 DIAGNOSIS — Z810 Family history of intellectual disabilities: Secondary | ICD-10-CM | POA: Diagnosis not present

## 2017-02-17 MED ORDER — ALPRAZOLAM 1 MG PO TABS: 1.0000 mg | ORAL_TABLET | Freq: Three times a day (TID) | ORAL | 2 refills | Status: DC | PRN

## 2017-02-17 MED ORDER — VILAZODONE HCL 10 MG PO TABS: 10.0000 mg | ORAL_TABLET | Freq: Every day | ORAL | 2 refills | Status: DC

## 2017-02-17 MED ORDER — BUPROPION HCL ER (XL) 300 MG PO TB24: 300.0000 mg | ORAL_TABLET | Freq: Every morning | ORAL | 2 refills | Status: DC
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
## 2017-03-21 ENCOUNTER — Other Ambulatory Visit: Payer: Self-pay | Admitting: Family Medicine

## 2017-03-21 ENCOUNTER — Other Ambulatory Visit: Payer: Self-pay | Admitting: Nurse Practitioner

## 2017-04-21 ENCOUNTER — Other Ambulatory Visit: Payer: Self-pay | Admitting: Family Medicine

## 2017-04-21 ENCOUNTER — Encounter: Payer: Self-pay | Admitting: Gastroenterology

## 2017-05-19 ENCOUNTER — Encounter (HOSPITAL_COMMUNITY): Payer: Self-pay | Admitting: Psychiatry

## 2017-05-19 ENCOUNTER — Ambulatory Visit (INDEPENDENT_AMBULATORY_CARE_PROVIDER_SITE_OTHER): Payer: Medicaid Other | Admitting: Psychiatry

## 2017-05-19 VITALS — BP 120/78 | HR 90 | Ht 68.0 in | Wt 146.2 lb

## 2017-05-19 DIAGNOSIS — F321 Major depressive disorder, single episode, moderate: Secondary | ICD-10-CM | POA: Diagnosis not present

## 2017-05-19 MED ORDER — BUPROPION HCL ER (XL) 300 MG PO TB24: 300.0000 mg | ORAL_TABLET | Freq: Every morning | ORAL | 2 refills | Status: DC

## 2017-05-19 MED ORDER — ALPRAZOLAM 1 MG PO TABS: 1.0000 mg | ORAL_TABLET | Freq: Three times a day (TID) | ORAL | 2 refills | Status: DC | PRN

## 2017-05-19 MED ORDER — VILAZODONE HCL 10 MG PO TABS: 10.0000 mg | ORAL_TABLET | Freq: Every day | ORAL | 2 refills | Status: DC
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
## 2017-05-21 ENCOUNTER — Other Ambulatory Visit: Payer: Self-pay | Admitting: Family Medicine

## 2017-05-21 ENCOUNTER — Other Ambulatory Visit: Payer: Self-pay | Admitting: Nurse Practitioner

## 2017-07-16 ENCOUNTER — Emergency Department (HOSPITAL_COMMUNITY)
Admission: EM | Admit: 2017-07-16 | Discharge: 2017-07-16 | Disposition: A | Discharge status: Home / Self Care | Payer: Medicaid Other | Attending: Emergency Medicine | Admitting: Emergency Medicine

## 2017-07-16 ENCOUNTER — Encounter (HOSPITAL_COMMUNITY): Payer: Self-pay | Admitting: *Deleted

## 2017-07-16 ENCOUNTER — Emergency Department (HOSPITAL_COMMUNITY): Payer: Medicaid Other

## 2017-07-16 DIAGNOSIS — Z941 Heart transplant status: Secondary | ICD-10-CM | POA: Insufficient documentation

## 2017-07-16 DIAGNOSIS — L03115 Cellulitis of right lower limb: Secondary | ICD-10-CM | POA: Diagnosis not present

## 2017-07-16 DIAGNOSIS — Z79899 Other long term (current) drug therapy: Secondary | ICD-10-CM | POA: Insufficient documentation

## 2017-07-16 DIAGNOSIS — E119 Type 2 diabetes mellitus without complications: Secondary | ICD-10-CM | POA: Insufficient documentation

## 2017-07-16 DIAGNOSIS — Z7984 Long term (current) use of oral hypoglycemic drugs: Secondary | ICD-10-CM | POA: Insufficient documentation

## 2017-07-16 DIAGNOSIS — F329 Major depressive disorder, single episode, unspecified: Secondary | ICD-10-CM | POA: Diagnosis not present

## 2017-07-16 DIAGNOSIS — M79671 Pain in right foot: Secondary | ICD-10-CM | POA: Diagnosis present

## 2017-07-16 DIAGNOSIS — I252 Old myocardial infarction: Secondary | ICD-10-CM | POA: Diagnosis not present

## 2017-07-16 DIAGNOSIS — I110 Hypertensive heart disease with heart failure: Secondary | ICD-10-CM | POA: Insufficient documentation

## 2017-07-16 DIAGNOSIS — L03119 Cellulitis of unspecified part of limb: Secondary | ICD-10-CM

## 2017-07-16 DIAGNOSIS — I509 Heart failure, unspecified: Secondary | ICD-10-CM | POA: Diagnosis not present

## 2017-07-16 DIAGNOSIS — Z87891 Personal history of nicotine dependence: Secondary | ICD-10-CM | POA: Diagnosis not present

## 2017-07-16 DIAGNOSIS — Z7982 Long term (current) use of aspirin: Secondary | ICD-10-CM | POA: Diagnosis not present

## 2017-07-16 DIAGNOSIS — F419 Anxiety disorder, unspecified: Secondary | ICD-10-CM | POA: Diagnosis not present

## 2017-07-16 MED ORDER — SULFAMETHOXAZOLE-TRIMETHOPRIM 800-160 MG PO TABS: 1.0000 | ORAL_TABLET | Freq: Two times a day (BID) | ORAL | 0 refills | Status: AC

## 2017-07-16 MED ORDER — SULFAMETHOXAZOLE-TRIMETHOPRIM 800-160 MG PO TABS
1.0000 | ORAL_TABLET | Freq: Once | ORAL | Status: AC
  Administered 2017-07-16: 1 via ORAL
  Filled 2017-07-16: qty 1

## 2017-07-16 NOTE — ED Triage Notes (Signed)
Pt comes in with right foot pain that started yesterday when he woke up. The pain started on the bottom of his foot and has moved to the outer right side of his right foot. Pt has area of redness to his outer foot. Denies any injury or insect bites. Denies any n/v/d or fevers.

## 2017-07-16 NOTE — Discharge Instructions (Signed)
Elevate your foot when possible.  Warm water soaks.  Return here in 1-2 days for recheck if the symptoms are worsening.
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
## 2017-07-21 ENCOUNTER — Other Ambulatory Visit: Payer: Self-pay | Admitting: Family Medicine

## 2017-07-21 DIAGNOSIS — I10 Essential (primary) hypertension: Secondary | ICD-10-CM

## 2017-07-24 ENCOUNTER — Encounter: Payer: Self-pay | Admitting: Family Medicine

## 2017-07-24 ENCOUNTER — Ambulatory Visit (INDEPENDENT_AMBULATORY_CARE_PROVIDER_SITE_OTHER): Payer: Medicaid Other | Admitting: Family Medicine

## 2017-07-24 VITALS — BP 127/80 | HR 83 | Temp 97.6°F | Ht 68.0 in | Wt 142.0 lb

## 2017-07-24 DIAGNOSIS — I10 Essential (primary) hypertension: Secondary | ICD-10-CM

## 2017-07-24 DIAGNOSIS — N182 Chronic kidney disease, stage 2 (mild): Secondary | ICD-10-CM | POA: Diagnosis not present

## 2017-07-24 DIAGNOSIS — E1122 Type 2 diabetes mellitus with diabetic chronic kidney disease: Secondary | ICD-10-CM | POA: Diagnosis not present

## 2017-07-24 DIAGNOSIS — L03115 Cellulitis of right lower limb: Secondary | ICD-10-CM

## 2017-07-24 DIAGNOSIS — Z23 Encounter for immunization: Secondary | ICD-10-CM

## 2017-07-24 DIAGNOSIS — Z941 Heart transplant status: Secondary | ICD-10-CM | POA: Diagnosis not present

## 2017-07-24 LAB — BAYER DCA HB A1C WAIVED: HB A1C: 6.1 % (ref <7.0)

## 2017-07-24 MED ORDER — METFORMIN HCL 500 MG PO TABS: 500.0000 mg | ORAL_TABLET | Freq: Two times a day (BID) | ORAL | 2 refills | Status: DC

## 2017-07-24 MED ORDER — SULFAMETHOXAZOLE-TRIMETHOPRIM 800-160 MG PO TABS: 1.0000 | ORAL_TABLET | Freq: Two times a day (BID) | ORAL | 0 refills | Status: DC

## 2017-07-24 MED ORDER — AMLODIPINE BESYLATE 2.5 MG PO TABS: 2.5000 mg | ORAL_TABLET | Freq: Every day | ORAL | 2 refills | Status: DC

## 2017-07-24 NOTE — Progress Notes (Signed)
BP 127/80   Pulse 83   Temp 97.6 F (36.4 C) (Oral)   Ht '5\' 8"'  (1.727 m)   Wt 142 lb (64.4 kg)   BMI 21.59 kg/m    Subjective:    Patient ID: Anthony Price, male    DOB: 1956/05/22, 61 y.o.   MRN: [id number]  HPI: Anthony Price is a 61 y.o. male presenting on 07/24/2017 for Hypertension (follow up); Diabetes; and Cellulitis of right foot (Diagnosed at ER visit on 9/20; given Bactrim, finished this morning, still has small red spot on right side of foot)   HPI Hypertension Patient is currently on amlodipine and carvedilol and ramipril, and their blood pressure today is 127/80. Patient denies any lightheadedness or dizziness. Patient denies headaches, blurred vision, chest pains, shortness of breath, or weakness. Denies any side effects from medication and is content with current medication.   Type 2 diabetes mellitus Patient comes in today for recheck of his diabetes. Patient has been currently taking metformin. Patient is currently on an ACE inhibitor/ARB. Patient has seen an ophthalmologist this year. Patient denies any issues with their feet.   History of heart transplant Patient continues to see [facility] heart transplant team for follow-up. We will do labs today.  ER follow-up for cellulitis Patient was in the ER on 07/16/2017 and diagnosed with cellulitis and given an antibiotic at that time. The swelling and redness was on his right foot and with the antibiotic has almost completely resolved and he still just has a small patch that is slightly pink/red in color. He says all the pain is gone and he has not had any fevers or chills. He is relatively feeling a lot better and it looks a lot better but is still has a small area that is concerned about that is not completely gone. He finished his last dose of antibiotics just this morning.  Relevant past medical, surgical, family and social history reviewed and updated as indicated. Interim medical history since our last visit  reviewed. Allergies and medications reviewed and updated.  Review of Systems  Constitutional: Negative for chills and fever.  Eyes: Negative for discharge.  Respiratory: Negative for shortness of breath and wheezing.   Cardiovascular: Negative for chest pain, palpitations and leg swelling.  Musculoskeletal: Negative for back pain and gait problem.  Skin: Positive for color change. Negative for rash and wound.  Neurological: Negative for dizziness, weakness and light-headedness.  All other systems reviewed and are negative.   Per HPI unless specifically indicated above   Allergies as of 07/24/2017      Reactions   Lactose Intolerance (gi)    bloating      Medication List       Accurate as of 07/24/17 11:26 AM. Always use your most recent med list.          ACCU-CHEK AVIVA PLUS w/Device Kit Test qd. DX E11.9   ACCU-CHEK SOFTCLIX LANCETS lancets Test once qd. DX E11.9   acetaminophen 500 MG tablet Commonly known as:  TYLENOL Take 500 mg by mouth every 6 (six) hours as needed. Pain   ALPRAZolam 1 MG tablet Commonly known as:  XANAX Take 1 tablet (1 mg total) by mouth 3 (three) times daily as needed for anxiety.   amLODipine 2.5 MG tablet Commonly known as:  NORVASC Take 1 tablet (2.5 mg total) by mouth daily.   aspirin EC 81 MG tablet Take 81 mg by mouth daily.   atorvastatin 40 MG tablet Commonly known  as:  LIPITOR Take 40 mg by mouth daily.   buPROPion 300 MG 24 hr tablet Commonly known as:  WELLBUTRIN XL Take 1 tablet (300 mg total) by mouth every morning.   carvedilol 12.5 MG tablet Commonly known as:  COREG TAKE 1 TABLET BY MOUTH TWICE DAILY.   esomeprazole 20 MG capsule Commonly known as:  NEXIUM TAKE 1 CAPSULE BY MOUTH TWICE DAILY BEFORE A MEAL.   glucose blood test strip Commonly known as:  ACCU-CHEK AVIVA PLUS Test once qd. DX E11.9   metFORMIN 500 MG tablet Commonly known as:  GLUCOPHAGE Take 1 tablet (500 mg total) by mouth 2 (two) times  daily with a meal.   mycophenolate 500 MG tablet Commonly known as:  CELLCEPT Take 500-1,000 mg by mouth 2 (two) times daily. Patient takes 1 tablet (527m) in the morning and 2 tablets(10086m   PROBIOTIC DAILY PO Take by mouth daily.   ramipril 2.5 MG capsule Commonly known as:  ALTACE Take 2.5 mg by mouth daily.   sildenafil 20 MG tablet Commonly known as:  REVATIO Take 1 tablet (20 mg total) by mouth daily as needed.   sildenafil 50 MG tablet Commonly known as:  VIAGRA Take 1 tablet (50 mg total) by mouth daily as needed for erectile dysfunction.   sulfamethoxazole-trimethoprim 800-160 MG tablet Commonly known as:  BACTRIM DS,SEPTRA DS Take 1 tablet by mouth 2 (two) times daily.   tacrolimus 1 MG capsule Commonly known as:  PROGRAF Take 1 mg by mouth. 1 mg in the am and 0.5 mg in the pm   Vilazodone HCl 10 MG Tabs Commonly known as:  VIIBRYD Take 1 tablet (10 mg total) by mouth daily.            Discharge Care Instructions        Start     Ordered   07/24/17 0000  [facility]     07/24/17 1110   07/24/17 0000  CMP14+EGFR     07/24/17 1110   07/24/17 0000  Lipid panel     07/24/17 1110   07/24/17 0000  sulfamethoxazole-trimethoprim (BACTRIM DS,SEPTRA DS) 800-160 MG tablet  2 times daily     07/24/17 1126   07/24/17 0000  metFORMIN (GLUCOPHAGE) 500 MG tablet  2 times daily with meals     07/24/17 1126   07/24/17 0000  amLODipine (NORVASC) 2.5 MG tablet  Daily     07/24/17 1126         Objective:    BP 127/80   Pulse 83   Temp 97.6 F (36.4 C) (Oral)   Ht '5\' 8"'  (1.727 m)   Wt 142 lb (64.4 kg)   BMI 21.59 kg/m   Wt Readings from Last 3 Encounters:  07/24/17 142 lb (64.4 kg)  07/16/17 141 lb (64 kg)  01/21/17 151 lb (68.5 kg)    Physical Exam  Constitutional: He is oriented to person, place, and time. He appears well-developed and well-nourished. No distress.  Eyes: Conjunctivae are normal. No scleral icterus.  Neck: Neck supple.  No thyromegaly present.  Cardiovascular: Normal rate, regular rhythm, normal heart sounds and intact distal pulses.   No murmur heard. Pulmonary/Chest: Effort normal and breath sounds normal. No respiratory distress. He has no wheezes. He has no rales.  Musculoskeletal: Normal range of motion. He exhibits no edema.  Lymphadenopathy:    He has no cervical adenopathy.  Neurological: He is alert and oriented to person, place, and time. Coordination normal.  Skin: Skin is warm and dry. No rash noted. He is not diaphoretic. There is erythema.     Psychiatric: He has a normal mood and affect. His behavior is normal.  Nursing note and vitals reviewed.   Results for orders placed or performed in visit on 09/22/16  Bayer DCA Hb A1c Waived  Result Value Ref Range   Bayer DCA Hb A1c Waived 5.8 <7.0 %      Assessment & Plan:   Problem List Items Addressed This Visit      Cardiovascular and Mediastinum   HTN (hypertension) - Primary   Relevant Medications   amLODipine (NORVASC) 2.5 MG tablet   Other Relevant Orders   CMP14+EGFR   Lipid panel     Endocrine   Diabetes type 2, controlled ([place])   Relevant Medications   metFORMIN (GLUCOPHAGE) 500 MG tablet   Other Relevant Orders   Bayer DCA Hb A1c Waived   CMP14+EGFR   Lipid panel     Other   History of heart transplant ([place])   Relevant Orders   Lipid panel   HLA A,B,C (IR)   CMV DNA, quantitative, PCR   Lyme Ab/Western Blot Reflex    Other Visit Diagnoses    Cellulitis of right lower extremity       Relevant Medications   sulfamethoxazole-trimethoprim (BACTRIM DS,SEPTRA DS) 800-160 MG tablet   Need for immunization against influenza       Relevant Orders   Flu Vaccine QUAD 36+ mos IM (Completed)       Follow up plan: Return in about 6 months (around 01/21/2018), or if symptoms worsen or fail to improve, for Hypertension and diabetes.  Counseling provided for all of the vaccine components Orders Placed This Encounter    Procedures  . Bayer DCA Hb A1c Waived  . CMP14+EGFR  . Lipid panel    Caryl Pina, MD [facility] 07/24/2017, 11:26 AM

## 2017-07-27 ENCOUNTER — Other Ambulatory Visit: Payer: Self-pay | Admitting: *Deleted

## 2017-07-27 ENCOUNTER — Other Ambulatory Visit: Payer: Medicaid Other

## 2017-07-27 DIAGNOSIS — E875 Hyperkalemia: Secondary | ICD-10-CM

## 2017-07-27 DIAGNOSIS — R7989 Other specified abnormal findings of blood chemistry: Secondary | ICD-10-CM

## 2017-07-27 LAB — LYME AB/WESTERN BLOT REFLEX

## 2017-07-28 LAB — BMP8+EGFR
BUN / CREAT RATIO: 13 (ref 9–20)
BUN: 23 mg/dL (ref 6–24)
CHLORIDE: 103 mmol/L (ref 96–106)
CO2: 20 mmol/L (ref 20–29)
Calcium: 9.6 mg/dL (ref 8.7–10.2)
Creatinine, Ser: 1.78 mg/dL — ABNORMAL HIGH (ref 0.76–1.27)
GFR calc Af Amer: 47 mL/min/{1.73_m2} — ABNORMAL LOW (ref >59)
GFR calc non Af Amer: 41 mL/min/{1.73_m2} — ABNORMAL LOW (ref >59)
GLUCOSE: 75 mg/dL (ref 65–99)
POTASSIUM: 5.6 mmol/L — AB (ref 3.5–5.2)
Sodium: 137 mmol/L (ref 134–144)

## 2017-07-28 LAB — CMV DNA, QUANTITATIVE, PCR: CMV DNA QUANT: NEGATIVE [IU]/mL

## 2017-07-31 LAB — LIPID PANEL
CHOL/HDL RATIO: 4.4 ratio (ref 0.0–5.0)
Cholesterol, Total: 122 mg/dL (ref 100–199)
HDL: 28 mg/dL — AB (ref >39)
LDL Calculated: 54 mg/dL (ref 0–99)
TRIGLYCERIDES: 198 mg/dL — AB (ref 0–149)
VLDL Cholesterol Cal: 40 mg/dL (ref 5–40)

## 2017-07-31 LAB — HLA A,B,C (IR)

## 2017-07-31 LAB — CMP14+EGFR
A/G RATIO: 1.9 (ref 1.2–2.2)
ALT: 18 IU/L (ref 0–44)
AST: 22 IU/L (ref 0–40)
Albumin: 4.8 g/dL (ref 3.5–5.5)
Alkaline Phosphatase: 53 IU/L (ref 39–117)
BUN/Creatinine Ratio: 13 (ref 9–20)
BUN: 22 mg/dL (ref 6–24)
Bilirubin Total: 0.4 mg/dL (ref 0.0–1.2)
CALCIUM: 9.6 mg/dL (ref 8.7–10.2)
CO2: 20 mmol/L (ref 20–29)
Chloride: 101 mmol/L (ref 96–106)
Creatinine, Ser: 1.64 mg/dL — ABNORMAL HIGH (ref 0.76–1.27)
GFR, EST AFRICAN AMERICAN: 52 mL/min/{1.73_m2} — AB (ref >59)
GFR, EST NON AFRICAN AMERICAN: 45 mL/min/{1.73_m2} — AB (ref >59)
GLOBULIN, TOTAL: 2.5 g/dL (ref 1.5–4.5)
Glucose: 123 mg/dL — ABNORMAL HIGH (ref 65–99)
Potassium: 5.9 mmol/L (ref 3.5–5.2)
Sodium: 137 mmol/L (ref 134–144)
TOTAL PROTEIN: 7.3 g/dL (ref 6.0–8.5)

## 2017-08-19 ENCOUNTER — Encounter (HOSPITAL_COMMUNITY): Payer: Self-pay | Admitting: Psychiatry

## 2017-08-19 ENCOUNTER — Ambulatory Visit (INDEPENDENT_AMBULATORY_CARE_PROVIDER_SITE_OTHER): Payer: Medicaid Other | Admitting: Psychiatry

## 2017-08-19 VITALS — BP 144/85 | HR 86 | Ht 68.0 in | Wt 142.6 lb

## 2017-08-19 DIAGNOSIS — R42 Dizziness and giddiness: Secondary | ICD-10-CM | POA: Diagnosis not present

## 2017-08-19 DIAGNOSIS — R5383 Other fatigue: Secondary | ICD-10-CM

## 2017-08-19 DIAGNOSIS — R450 Nervousness: Secondary | ICD-10-CM | POA: Diagnosis not present

## 2017-08-19 DIAGNOSIS — F419 Anxiety disorder, unspecified: Secondary | ICD-10-CM

## 2017-08-19 DIAGNOSIS — F321 Major depressive disorder, single episode, moderate: Secondary | ICD-10-CM | POA: Diagnosis not present

## 2017-08-19 DIAGNOSIS — Z818 Family history of other mental and behavioral disorders: Secondary | ICD-10-CM | POA: Diagnosis not present

## 2017-08-19 DIAGNOSIS — R5381 Other malaise: Secondary | ICD-10-CM

## 2017-08-19 DIAGNOSIS — Z87891 Personal history of nicotine dependence: Secondary | ICD-10-CM | POA: Diagnosis not present

## 2017-08-19 DIAGNOSIS — Z810 Family history of intellectual disabilities: Secondary | ICD-10-CM | POA: Diagnosis not present

## 2017-08-19 DIAGNOSIS — Z9489 Other transplanted organ and tissue status: Secondary | ICD-10-CM

## 2017-08-19 DIAGNOSIS — Z736 Limitation of activities due to disability: Secondary | ICD-10-CM

## 2017-08-19 MED ORDER — BUPROPION HCL ER (XL) 300 MG PO TB24: 300.0000 mg | ORAL_TABLET | Freq: Every morning | ORAL | 2 refills | Status: DC

## 2017-08-19 MED ORDER — ALPRAZOLAM 1 MG PO TABS: 1.0000 mg | ORAL_TABLET | Freq: Three times a day (TID) | ORAL | 2 refills | Status: DC | PRN

## 2017-08-19 MED ORDER — VILAZODONE HCL 10 MG PO TABS: 10.0000 mg | ORAL_TABLET | Freq: Every day | ORAL | 2 refills | Status: DC
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
## 2017-08-21 ENCOUNTER — Other Ambulatory Visit: Payer: Self-pay | Admitting: Family Medicine

## 2017-08-21 DIAGNOSIS — I10 Essential (primary) hypertension: Secondary | ICD-10-CM

## 2017-09-24 ENCOUNTER — Other Ambulatory Visit: Payer: Self-pay | Admitting: Gastroenterology

## 2017-11-03 DIAGNOSIS — N183 Chronic kidney disease, stage 3 (moderate): Secondary | ICD-10-CM | POA: Diagnosis not present

## 2017-11-03 DIAGNOSIS — I10 Essential (primary) hypertension: Secondary | ICD-10-CM | POA: Diagnosis not present

## 2017-11-03 DIAGNOSIS — Z941 Heart transplant status: Secondary | ICD-10-CM | POA: Diagnosis not present

## 2017-11-19 ENCOUNTER — Encounter (HOSPITAL_COMMUNITY): Payer: Self-pay | Admitting: Psychiatry

## 2017-11-19 ENCOUNTER — Ambulatory Visit (INDEPENDENT_AMBULATORY_CARE_PROVIDER_SITE_OTHER): Payer: Medicaid Other | Admitting: Psychiatry

## 2017-11-19 DIAGNOSIS — Z810 Family history of intellectual disabilities: Secondary | ICD-10-CM | POA: Diagnosis not present

## 2017-11-19 DIAGNOSIS — Z818 Family history of other mental and behavioral disorders: Secondary | ICD-10-CM

## 2017-11-19 DIAGNOSIS — F321 Major depressive disorder, single episode, moderate: Secondary | ICD-10-CM | POA: Diagnosis not present

## 2017-11-19 DIAGNOSIS — Z736 Limitation of activities due to disability: Secondary | ICD-10-CM | POA: Diagnosis not present

## 2017-11-19 DIAGNOSIS — Z87891 Personal history of nicotine dependence: Secondary | ICD-10-CM | POA: Diagnosis not present

## 2017-11-19 MED ORDER — ALPRAZOLAM 1 MG PO TABS: 1.0000 mg | ORAL_TABLET | Freq: Three times a day (TID) | ORAL | 2 refills | Status: DC | PRN

## 2017-11-19 MED ORDER — VILAZODONE HCL 10 MG PO TABS: 10.0000 mg | ORAL_TABLET | Freq: Every day | ORAL | 2 refills | Status: DC

## 2017-11-19 MED ORDER — BUPROPION HCL ER (XL) 300 MG PO TB24: 300.0000 mg | ORAL_TABLET | Freq: Every morning | ORAL | 2 refills | Status: DC
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
## 2017-12-21 ENCOUNTER — Other Ambulatory Visit: Payer: Self-pay | Admitting: Family Medicine

## 2017-12-21 DIAGNOSIS — I10 Essential (primary) hypertension: Secondary | ICD-10-CM

## 2018-01-21 ENCOUNTER — Ambulatory Visit: Payer: Medicaid Other | Admitting: Family Medicine

## 2018-01-21 ENCOUNTER — Encounter: Payer: Self-pay | Admitting: Family Medicine

## 2018-01-21 VITALS — BP 135/85 | HR 88 | Temp 97.5°F | Ht 68.0 in | Wt 146.0 lb

## 2018-01-21 DIAGNOSIS — K219 Gastro-esophageal reflux disease without esophagitis: Secondary | ICD-10-CM | POA: Diagnosis not present

## 2018-01-21 DIAGNOSIS — E1159 Type 2 diabetes mellitus with other circulatory complications: Secondary | ICD-10-CM | POA: Diagnosis not present

## 2018-01-21 DIAGNOSIS — N182 Chronic kidney disease, stage 2 (mild): Secondary | ICD-10-CM | POA: Diagnosis not present

## 2018-01-21 DIAGNOSIS — I10 Essential (primary) hypertension: Secondary | ICD-10-CM | POA: Diagnosis not present

## 2018-01-21 DIAGNOSIS — E1122 Type 2 diabetes mellitus with diabetic chronic kidney disease: Secondary | ICD-10-CM | POA: Diagnosis not present

## 2018-01-21 LAB — BAYER DCA HB A1C WAIVED: HB A1C (BAYER DCA - WAIVED): 5.9 % (ref <7.0)

## 2018-01-21 NOTE — Progress Notes (Signed)
BP 135/85   Pulse 88   Temp (!) 97.5 F (36.4 C) (Oral)   Ht '5\' 8"'  (1.727 m)   Wt 146 lb (66.2 kg)   BMI 22.20 kg/m    Subjective:    Patient ID: Anthony Price, male    DOB: December 19, 1955, 62 y.o.   MRN: [id number]  HPI: Anthony Price is a 62 y.o. male presenting on 01/21/2018 for Diabetes (6 mo) and Hypertension   HPI Type 2 diabetes mellitus Patient comes in today for recheck of his diabetes. Patient has been currently taking metformin. Patient is currently on an ACE inhibitor/ARB. Patient has not seen an ophthalmologist this year. Patient denies any issues with their feet.  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  He is on immunosuppressants to help with the transplant longevity.  Hypertension Patient is currently on amlodipine and ramipril, and their blood pressure today is 135/85. Patient denies any lightheadedness or dizziness. Patient denies headaches, blurred vision, chest pains, shortness of breath, or weakness. Denies any side effects from medication and is content with current medication.   GERD Patient is currently on Nexium.  She denies any major symptoms or abdominal pain or belching or burping. She denies any blood in her stool or lightheadedness or dizziness.   Relevant past medical, surgical, family and social history reviewed and updated as indicated. Interim medical history since our last visit reviewed. Allergies and medications reviewed and updated.  Review of Systems  Constitutional: Negative for chills and fever.  Respiratory: Negative for shortness of breath and wheezing.   Cardiovascular: Negative for chest pain and leg swelling.  Gastrointestinal: Negative for abdominal pain.  Musculoskeletal: Negative for back pain and gait problem.  Skin: Negative for rash.  Neurological: Negative for dizziness, weakness and numbness.  All other systems reviewed and are negative.   Per HPI  unless specifically indicated above   Allergies as of 01/21/2018      Reactions   Lactose Intolerance (gi)    bloating      Medication List        Accurate as of 01/21/18 11:45 AM. Always use your most recent med list.          ACCU-CHEK AVIVA PLUS w/Device Kit Test qd. DX E11.9   ACCU-CHEK SOFTCLIX LANCETS lancets Test once qd. DX E11.9   acetaminophen 500 MG tablet Commonly known as:  TYLENOL Take 500 mg by mouth every 6 (six) hours as needed. Pain   ALPRAZolam 1 MG tablet Commonly known as:  XANAX Take 1 tablet (1 mg total) by mouth 3 (three) times daily as needed for anxiety.   amLODipine 2.5 MG tablet Commonly known as:  NORVASC Take 1 tablet (2.5 mg total) by mouth daily.   aspirin EC 81 MG tablet Take 81 mg by mouth daily.   atorvastatin 40 MG tablet Commonly known as:  LIPITOR Take 40 mg by mouth daily.   buPROPion 300 MG 24 hr tablet Commonly known as:  WELLBUTRIN XL Take 1 tablet (300 mg total) by mouth every morning.   carvedilol 12.5 MG tablet Commonly known as:  COREG TAKE 1 TABLET BY MOUTH TWICE DAILY.   esomeprazole 20 MG capsule Commonly known as:  NEXIUM TAKE 1 CAPSULE BY MOUTH TWICE DAILY BEFORE A MEAL.   glucose blood test strip Commonly known as:  ACCU-CHEK AVIVA PLUS Test once qd. DX E11.9   metFORMIN 500 MG tablet Commonly  known as:  GLUCOPHAGE Take 1 tablet (500 mg total) by mouth 2 (two) times daily with a meal.   mycophenolate 500 MG tablet Commonly known as:  CELLCEPT Take 500-1,000 mg by mouth 2 (two) times daily. Patient takes 1 tablet (556m) in the morning and 2 tablets(10013m   PROBIOTIC DAILY PO Take by mouth daily.   ramipril 2.5 MG capsule Commonly known as:  ALTACE Take 2.5 mg by mouth daily.   sildenafil 20 MG tablet Commonly known as:  REVATIO Take 1 tablet (20 mg total) by mouth daily as needed.   sulfamethoxazole-trimethoprim 800-160 MG tablet Commonly known as:  BACTRIM DS,SEPTRA DS Take 1 tablet by  mouth 2 (two) times daily.   tacrolimus 1 MG capsule Commonly known as:  PROGRAF Take 1 mg by mouth. 1 mg in the am and 0.5 mg in the pm   Vilazodone HCl 10 MG Tabs Commonly known as:  VIIBRYD Take 1 tablet (10 mg total) by mouth daily.          Objective:    BP 135/85   Pulse 88   Temp (!) 97.5 F (36.4 C) (Oral)   Ht '5\' 8"'  (1.727 m)   Wt 146 lb (66.2 kg)   BMI 22.20 kg/m   Wt Readings from Last 3 Encounters:  01/21/18 146 lb (66.2 kg)  07/24/17 142 lb (64.4 kg)  07/16/17 141 lb (64 kg)    Physical Exam  Constitutional: He is oriented to person, place, and time. He appears well-developed and well-nourished. No distress.  Eyes: Conjunctivae are normal. No scleral icterus.  Neck: Neck supple. No thyromegaly present.  Cardiovascular: Normal rate, regular rhythm, normal heart sounds and intact distal pulses.  No murmur heard. Pulmonary/Chest: Effort normal and breath sounds normal. No respiratory distress. He has no wheezes.  Abdominal: Soft. Bowel sounds are normal. He exhibits no distension. There is no tenderness.  Musculoskeletal: Normal range of motion. He exhibits no edema.  Lymphadenopathy:    He has no cervical adenopathy.  Neurological: He is alert and oriented to person, place, and time. Coordination normal.  Skin: Skin is warm and dry. No rash noted. He is not diaphoretic.  Psychiatric: He has a normal mood and affect. His behavior is normal.  Nursing note and vitals reviewed.      Assessment & Plan:   Problem List Items Addressed This Visit      Cardiovascular and Mediastinum   Hypertension associated with diabetes ([facility]   Relevant Orders   CMP14+EGFR (Completed)   Lipid panel (Completed)     Digestive   GERD (gastroesophageal reflux disease)     Endocrine   Diabetes type 2, controlled ([facility]  Relevant Orders   Bayer DCA Hb A1c Waived (Completed)   CMP14+EGFR (Completed)   Lipid panel (Completed)       Follow up plan: Return in  about 6 months (around 07/24/2018), or if symptoms worsen or fail to improve, for Hypertension and diabetes recheck.  Counseling provided for all of the vaccine components Orders Placed This Encounter  Procedures  . Bayer DCA Hb A1c Waived  . CMP14+EGFR  . Lipid panel    JoCaryl PinaMD [facility] 01/21/2018, 11:45 AM

## 2018-01-22 LAB — LIPID PANEL
CHOLESTEROL TOTAL: 127 mg/dL (ref 100–199)
Chol/HDL Ratio: 4.2 ratio (ref 0.0–5.0)
HDL: 30 mg/dL — ABNORMAL LOW (ref >39)
LDL Calculated: 45 mg/dL (ref 0–99)
Triglycerides: 258 mg/dL — ABNORMAL HIGH (ref 0–149)
VLDL CHOLESTEROL CAL: 52 mg/dL — AB (ref 5–40)

## 2018-01-22 LAB — CMP14+EGFR
ALBUMIN: 5.2 g/dL (ref 3.5–5.5)
ALK PHOS: 66 IU/L (ref 39–117)
ALT: 14 IU/L (ref 0–44)
AST: 14 IU/L (ref 0–40)
Albumin/Globulin Ratio: 2.3 — ABNORMAL HIGH (ref 1.2–2.2)
BILIRUBIN TOTAL: 0.7 mg/dL (ref 0.0–1.2)
BUN / CREAT RATIO: 13 (ref 9–20)
BUN: 18 mg/dL (ref 6–24)
CHLORIDE: 99 mmol/L (ref 96–106)
CO2: 23 mmol/L (ref 20–29)
CREATININE: 1.44 mg/dL — AB (ref 0.76–1.27)
Calcium: 9.9 mg/dL (ref 8.7–10.2)
GFR calc Af Amer: 61 mL/min/{1.73_m2} (ref >59)
GFR calc non Af Amer: 53 mL/min/{1.73_m2} — ABNORMAL LOW (ref >59)
GLUCOSE: 165 mg/dL — AB (ref 65–99)
Globulin, Total: 2.3 g/dL (ref 1.5–4.5)
Potassium: 4.8 mmol/L (ref 3.5–5.2)
SODIUM: 138 mmol/L (ref 134–144)
Total Protein: 7.5 g/dL (ref 6.0–8.5)

## 2018-02-17 ENCOUNTER — Ambulatory Visit (HOSPITAL_COMMUNITY): Payer: Medicaid Other | Admitting: Psychiatry

## 2018-02-18 ENCOUNTER — Other Ambulatory Visit: Payer: Self-pay | Admitting: Family Medicine

## 2018-02-18 ENCOUNTER — Other Ambulatory Visit (HOSPITAL_COMMUNITY): Payer: Self-pay | Admitting: Psychiatry

## 2018-02-18 DIAGNOSIS — I10 Essential (primary) hypertension: Secondary | ICD-10-CM

## 2018-02-18 DIAGNOSIS — F321 Major depressive disorder, single episode, moderate: Secondary | ICD-10-CM

## 2018-02-19 ENCOUNTER — Ambulatory Visit (HOSPITAL_COMMUNITY): Payer: Medicaid Other | Admitting: Psychiatry

## 2018-02-19 ENCOUNTER — Encounter (HOSPITAL_COMMUNITY): Payer: Self-pay | Admitting: Psychiatry

## 2018-02-19 DIAGNOSIS — Z818 Family history of other mental and behavioral disorders: Secondary | ICD-10-CM

## 2018-02-19 DIAGNOSIS — F321 Major depressive disorder, single episode, moderate: Secondary | ICD-10-CM

## 2018-02-19 DIAGNOSIS — Z87891 Personal history of nicotine dependence: Secondary | ICD-10-CM

## 2018-02-19 DIAGNOSIS — Z810 Family history of intellectual disabilities: Secondary | ICD-10-CM

## 2018-02-19 DIAGNOSIS — Z736 Limitation of activities due to disability: Secondary | ICD-10-CM

## 2018-02-19 MED ORDER — BUPROPION HCL ER (XL) 300 MG PO TB24: 300.0000 mg | ORAL_TABLET | Freq: Every morning | ORAL | 2 refills | Status: DC

## 2018-02-19 MED ORDER — VILAZODONE HCL 10 MG PO TABS: 10.0000 mg | ORAL_TABLET | Freq: Every day | ORAL | 2 refills | Status: DC

## 2018-02-19 MED ORDER — ALPRAZOLAM 1 MG PO TABS: 1.0000 mg | ORAL_TABLET | Freq: Three times a day (TID) | ORAL | 2 refills | Status: DC | PRN
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
## 2018-03-20 ENCOUNTER — Other Ambulatory Visit: Payer: Self-pay | Admitting: Family Medicine

## 2018-03-20 ENCOUNTER — Other Ambulatory Visit: Payer: Self-pay | Admitting: Gastroenterology

## 2018-03-20 DIAGNOSIS — N182 Chronic kidney disease, stage 2 (mild): Principal | ICD-10-CM

## 2018-03-20 DIAGNOSIS — I10 Essential (primary) hypertension: Secondary | ICD-10-CM

## 2018-03-20 DIAGNOSIS — E1122 Type 2 diabetes mellitus with diabetic chronic kidney disease: Secondary | ICD-10-CM

## 2018-04-23 ENCOUNTER — Ambulatory Visit: Payer: Medicaid Other | Admitting: Family Medicine

## 2018-05-19 ENCOUNTER — Other Ambulatory Visit: Payer: Self-pay | Admitting: Family Medicine

## 2018-05-19 DIAGNOSIS — E1122 Type 2 diabetes mellitus with diabetic chronic kidney disease: Secondary | ICD-10-CM

## 2018-05-19 DIAGNOSIS — N182 Chronic kidney disease, stage 2 (mild): Principal | ICD-10-CM

## 2018-05-19 NOTE — Telephone Encounter (Signed)
OV 01/11/18 RTC 6 mos (9/19)

## 2018-05-21 ENCOUNTER — Ambulatory Visit (HOSPITAL_COMMUNITY): Payer: Medicaid Other | Admitting: Psychiatry

## 2018-05-21 ENCOUNTER — Encounter (HOSPITAL_COMMUNITY): Payer: Self-pay | Admitting: Psychiatry

## 2018-05-21 DIAGNOSIS — Z87891 Personal history of nicotine dependence: Secondary | ICD-10-CM | POA: Diagnosis not present

## 2018-05-21 DIAGNOSIS — Z813 Family history of other psychoactive substance abuse and dependence: Secondary | ICD-10-CM | POA: Diagnosis not present

## 2018-05-21 DIAGNOSIS — F321 Major depressive disorder, single episode, moderate: Secondary | ICD-10-CM

## 2018-05-21 DIAGNOSIS — Z818 Family history of other mental and behavioral disorders: Secondary | ICD-10-CM

## 2018-05-21 DIAGNOSIS — Z811 Family history of alcohol abuse and dependence: Secondary | ICD-10-CM

## 2018-05-21 MED ORDER — VILAZODONE HCL 10 MG PO TABS: 10.0000 mg | ORAL_TABLET | Freq: Every day | ORAL | 2 refills | Status: DC

## 2018-05-21 MED ORDER — ALPRAZOLAM 1 MG PO TABS: 1.0000 mg | ORAL_TABLET | Freq: Three times a day (TID) | ORAL | 2 refills | Status: DC | PRN

## 2018-05-21 MED ORDER — BUPROPION HCL ER (XL) 300 MG PO TB24: 300.0000 mg | ORAL_TABLET | Freq: Every morning | ORAL | 2 refills | Status: DC
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
## 2018-06-14 DIAGNOSIS — I10 Essential (primary) hypertension: Secondary | ICD-10-CM | POA: Diagnosis not present

## 2018-06-14 DIAGNOSIS — N183 Chronic kidney disease, stage 3 (moderate): Secondary | ICD-10-CM | POA: Diagnosis not present

## 2018-06-14 DIAGNOSIS — Z941 Heart transplant status: Secondary | ICD-10-CM | POA: Diagnosis not present

## 2018-06-21 ENCOUNTER — Other Ambulatory Visit: Payer: Self-pay | Admitting: Family Medicine

## 2018-06-21 DIAGNOSIS — E1122 Type 2 diabetes mellitus with diabetic chronic kidney disease: Secondary | ICD-10-CM

## 2018-06-21 DIAGNOSIS — N182 Chronic kidney disease, stage 2 (mild): Principal | ICD-10-CM

## 2018-07-26 ENCOUNTER — Ambulatory Visit: Payer: Medicaid Other | Admitting: Family Medicine

## 2018-07-26 ENCOUNTER — Encounter: Payer: Self-pay | Admitting: Family Medicine

## 2018-07-26 VITALS — BP 140/81 | HR 81 | Temp 97.5°F | Ht 68.0 in | Wt 146.4 lb

## 2018-07-26 DIAGNOSIS — R634 Abnormal weight loss: Secondary | ICD-10-CM | POA: Diagnosis not present

## 2018-07-26 DIAGNOSIS — K219 Gastro-esophageal reflux disease without esophagitis: Secondary | ICD-10-CM | POA: Diagnosis not present

## 2018-07-26 DIAGNOSIS — E1159 Type 2 diabetes mellitus with other circulatory complications: Secondary | ICD-10-CM

## 2018-07-26 DIAGNOSIS — Z23 Encounter for immunization: Secondary | ICD-10-CM

## 2018-07-26 DIAGNOSIS — N182 Chronic kidney disease, stage 2 (mild): Secondary | ICD-10-CM

## 2018-07-26 DIAGNOSIS — R5383 Other fatigue: Secondary | ICD-10-CM | POA: Diagnosis not present

## 2018-07-26 DIAGNOSIS — E1122 Type 2 diabetes mellitus with diabetic chronic kidney disease: Secondary | ICD-10-CM | POA: Diagnosis not present

## 2018-07-26 DIAGNOSIS — I10 Essential (primary) hypertension: Secondary | ICD-10-CM

## 2018-07-26 LAB — BAYER DCA HB A1C WAIVED: HB A1C: 6.2 % (ref <7.0)

## 2018-07-26 MED ORDER — ESOMEPRAZOLE MAGNESIUM 20 MG PO CPDR: DELAYED_RELEASE_CAPSULE | ORAL | 3 refills | Status: DC

## 2018-07-26 MED ORDER — METFORMIN HCL 500 MG PO TABS: ORAL_TABLET | ORAL | 3 refills | Status: DC

## 2018-07-26 MED ORDER — CARVEDILOL 12.5 MG PO TABS: 12.5000 mg | ORAL_TABLET | Freq: Two times a day (BID) | ORAL | 3 refills | Status: DC

## 2018-07-26 NOTE — Progress Notes (Signed)
BP 140/81   Pulse 81   Temp (!) 97.5 F (36.4 C) (Oral)   Ht '5\' 8"'  (1.727 m)   Wt 146 lb 6.4 oz (66.4 kg)   BMI 22.26 kg/m    Subjective:    Patient ID: Anthony Price, male    DOB: 05-Jul-1956, 62 y.o.   MRN: [id number]  HPI: Anthony Price is a 62 y.o. male presenting on 07/26/2018 for Hypertension (6 moth follow up); Diabetes; Fatigue (Patient states it has been going on x 1 month); and Flank Pain (right- x 4 days)   HPI Type 2 diabetes mellitus Patient comes in today for recheck of his diabetes. Patient has been currently taking metformin and his last A1c was 5.9, patient wants to continue on the metformin, will monitor renal function. Patient is currently on an ACE inhibitor/ARB. Patient has seen an ophthalmologist this year. Patient denies any issues with their feet.   Hypertension Patient is currently on carvedilol and ramipril and amlodipine, and their blood pressure today is 140/81. Patient denies any lightheadedness or dizziness. Patient denies headaches, blurred vision, chest pains, shortness of breath, or weakness. Denies any side effects from medication and is content with current medication.   GERD Patient is currently on Nexium.  She denies any major symptoms or abdominal pain or belching or burping. She denies any blood in her stool or lightheadedness or dizziness.   Patient comes in complaining of fatigue over the past year and weight loss over the past 2 years.  He says that he has lost almost 20 pounds over the past couple years and that he is just been feeling more tired and fatigued and if he sits down at all that he falls asleep rather quickly.  He does admit to snoring and daytime somnolence but does feel refreshed when he wakes up in the morning.  He wakes up at night on occasions but denies any urinary issues that because it but just wakes up and falls back asleep rather easily.  He denies any chest pain or shortness of breath or wheezing but with his history  of heart transplant recommended to come back and see the transplant team as its been 9 months since he seen them.  Relevant past medical, surgical, family and social history reviewed and updated as indicated. Interim medical history since our last visit reviewed. Allergies and medications reviewed and updated.  Review of Systems  Constitutional: Positive for fatigue. Negative for chills and fever.  Eyes: Negative for discharge.  Respiratory: Negative for cough, shortness of breath and wheezing.   Cardiovascular: Negative for chest pain, palpitations and leg swelling.  Musculoskeletal: Negative for back pain and gait problem.  Skin: Negative for color change and rash.  Neurological: Negative for dizziness, weakness, light-headedness and headaches.  Psychiatric/Behavioral: Positive for sleep disturbance. Negative for decreased concentration, dysphoric mood, self-injury and suicidal ideas. The patient is not nervous/anxious.   All other systems reviewed and are negative.   Per HPI unless specifically indicated above   Allergies as of 07/26/2018      Reactions   Lactose Intolerance (gi)    bloating      Medication List        Accurate as of 07/26/18 11:17 AM. Always use your most recent med list.          ACCU-CHEK AVIVA PLUS w/Device Kit Test qd. DX E11.9   ACCU-CHEK SOFTCLIX LANCETS lancets Test once qd. DX E11.9   acetaminophen 500 MG tablet Commonly known  as:  TYLENOL Take 500 mg by mouth every 6 (six) hours as needed. Pain   ALPRAZolam 1 MG tablet Commonly known as:  XANAX Take 1 tablet (1 mg total) by mouth 3 (three) times daily as needed for anxiety.   amLODipine 2.5 MG tablet Commonly known as:  NORVASC TAKE 1 TABLET BY MOUTH ONCE DAILY.   aspirin EC 81 MG tablet Take 81 mg by mouth daily.   atorvastatin 40 MG tablet Commonly known as:  LIPITOR Take 40 mg by mouth daily.   buPROPion 300 MG 24 hr tablet Commonly known as:  WELLBUTRIN XL Take 1 tablet (300  mg total) by mouth every morning.   carvedilol 12.5 MG tablet Commonly known as:  COREG Take 1 tablet (12.5 mg total) by mouth 2 (two) times daily.   esomeprazole 20 MG capsule Commonly known as:  NEXIUM TAKE 1 CAPSULE BY MOUTH TWICE DAILY BEFORE A MEAL.   glucose blood test strip Test once qd. DX E11.9   metFORMIN 500 MG tablet Commonly known as:  GLUCOPHAGE TAKE ONE TABLET BY MOUTH 2 TIMES A DAY WITH A MEAL.   mycophenolate 500 MG tablet Commonly known as:  CELLCEPT Take 500-1,000 mg by mouth 2 (two) times daily. Patient takes 1 tablet (541m) in the morning and 2 tablets(10056m   PROBIOTIC DAILY PO Take by mouth daily.   ramipril 2.5 MG capsule Commonly known as:  ALTACE Take 2.5 mg by mouth daily.   sildenafil 20 MG tablet Commonly known as:  REVATIO Take 1 tablet (20 mg total) by mouth daily as needed.   tacrolimus 1 MG capsule Commonly known as:  PROGRAF Take 1 mg by mouth. 1 mg in the am and 0.5 mg in the pm   Vilazodone HCl 10 MG Tabs Commonly known as:  VIIBRYD Take 1 tablet (10 mg total) by mouth daily.          Objective:    BP 140/81   Pulse 81   Temp (!) 97.5 F (36.4 C) (Oral)   Ht '5\' 8"'  (1.727 m)   Wt 146 lb 6.4 oz (66.4 kg)   BMI 22.26 kg/m   Wt Readings from Last 3 Encounters:  07/26/18 146 lb 6.4 oz (66.4 kg)  01/21/18 146 lb (66.2 kg)  07/24/17 142 lb (64.4 kg)    Physical Exam  Constitutional: He is oriented to person, place, and time. He appears well-developed and well-nourished. No distress.  Eyes: Conjunctivae are normal. No scleral icterus.  Neck: Neck supple. No thyromegaly present.  Cardiovascular: Normal rate, regular rhythm, normal heart sounds and intact distal pulses.  No murmur heard. Pulmonary/Chest: Effort normal and breath sounds normal. No respiratory distress. He has no wheezes.  Musculoskeletal: Normal range of motion. He exhibits no edema.  Lymphadenopathy:    He has no cervical adenopathy.  Neurological: He  is alert and oriented to person, place, and time. Coordination normal.  Skin: Skin is warm and dry. No rash noted. He is not diaphoretic.  Psychiatric: He has a normal mood and affect. His behavior is normal.  Nursing note and vitals reviewed.       Assessment & Plan:   Problem List Items Addressed This Visit      Cardiovascular and Mediastinum   Hypertension associated with diabetes ([place]  Relevant Medications   metFORMIN (GLUCOPHAGE) 500 MG tablet   carvedilol (COREG) 12.5 MG tablet   Other Relevant Orders   Microalbumin / creatinine urine ratio   CMP14+EGFR  Bayer DCA Hb A1c Waived     Digestive   GERD (gastroesophageal reflux disease)   Relevant Medications   esomeprazole (NEXIUM) 20 MG capsule   Other Relevant Orders   CBC with Differential/Platelet     Endocrine   Diabetes type 2, controlled ([place]) - Primary   Relevant Medications   metFORMIN (GLUCOPHAGE) 500 MG tablet   Other Relevant Orders   Microalbumin / creatinine urine ratio   CMP14+EGFR   Lipid panel   Bayer DCA Hb A1c Waived    Other Visit Diagnoses    Essential hypertension       Relevant Medications   carvedilol (COREG) 12.5 MG tablet   Other Relevant Orders   Microalbumin / creatinine urine ratio   CMP14+EGFR   Bayer DCA Hb A1c Waived   Other fatigue       Relevant Orders   TSH   PSA, total and free   Weight loss       Relevant Orders   TSH   PSA, total and free   Need for immunization against influenza       Relevant Orders   Flu Vaccine QUAD 36+ mos IM (Completed)       Follow up plan: Return in about 6 months (around 01/24/2019), or if symptoms worsen or fail to improve, for Diabetes hypertension recheck.  Counseling provided for all of the vaccine components Orders Placed This Encounter  Procedures  . Microalbumin / creatinine urine ratio  . CBC with Differential/Platelet  . CMP14+EGFR  . Lipid panel  . Bayer [facility] Hb A1c [place], MD [facility] 07/26/2018, 11:17 AM

## 2018-07-27 LAB — CMP14+EGFR
ALT: 15 IU/L (ref 0–44)
AST: 13 IU/L (ref 0–40)
Albumin/Globulin Ratio: 2.2 (ref 1.2–2.2)
Albumin: 4.7 g/dL (ref 3.6–4.8)
Alkaline Phosphatase: 59 IU/L (ref 39–117)
BUN/Creatinine Ratio: 11 (ref 10–24)
BUN: 16 mg/dL (ref 8–27)
Bilirubin Total: 0.4 mg/dL (ref 0.0–1.2)
CO2: 19 mmol/L — AB (ref 20–29)
CREATININE: 1.48 mg/dL — AB (ref 0.76–1.27)
Calcium: 9 mg/dL (ref 8.6–10.2)
Chloride: 103 mmol/L (ref 96–106)
GFR, EST AFRICAN AMERICAN: 59 mL/min/{1.73_m2} — AB (ref >59)
GFR, EST NON AFRICAN AMERICAN: 51 mL/min/{1.73_m2} — AB (ref >59)
GLUCOSE: 186 mg/dL — AB (ref 65–99)
Globulin, Total: 2.1 g/dL (ref 1.5–4.5)
POTASSIUM: 4.8 mmol/L (ref 3.5–5.2)
Sodium: 139 mmol/L (ref 134–144)
TOTAL PROTEIN: 6.8 g/dL (ref 6.0–8.5)

## 2018-07-27 LAB — CBC WITH DIFFERENTIAL/PLATELET
BASOS ABS: 0.1 10*3/uL (ref 0.0–0.2)
Basos: 1 %
EOS (ABSOLUTE): 0.1 10*3/uL (ref 0.0–0.4)
Eos: 2 %
Hematocrit: 41.5 % (ref 37.5–51.0)
Hemoglobin: 14.1 g/dL (ref 13.0–17.7)
Immature Grans (Abs): 0 10*3/uL (ref 0.0–0.1)
Immature Granulocytes: 0 %
LYMPHS ABS: 2.3 10*3/uL (ref 0.7–3.1)
Lymphs: 38 %
MCH: 28.4 pg (ref 26.6–33.0)
MCHC: 34 g/dL (ref 31.5–35.7)
MCV: 84 fL (ref 79–97)
MONOS ABS: 0.6 10*3/uL (ref 0.1–0.9)
Monocytes: 11 %
Neutrophils Absolute: 2.8 10*3/uL (ref 1.4–7.0)
Neutrophils: 48 %
Platelets: 147 10*3/uL — ABNORMAL LOW (ref 150–450)
RBC: 4.97 x10E6/uL (ref 4.14–5.80)
RDW: 12.8 % (ref 12.3–15.4)
WBC: 5.9 10*3/uL (ref 3.4–10.8)

## 2018-07-27 LAB — PSA, TOTAL AND FREE
PSA, Free Pct: 31.1 %
PSA, Free: 0.28 ng/mL
Prostate Specific Ag, Serum: 0.9 ng/mL (ref 0.0–4.0)

## 2018-07-27 LAB — LIPID PANEL
CHOL/HDL RATIO: 4.2 ratio (ref 0.0–5.0)
Cholesterol, Total: 113 mg/dL (ref 100–199)
HDL: 27 mg/dL — AB (ref >39)
LDL CALC: 43 mg/dL (ref 0–99)
TRIGLYCERIDES: 215 mg/dL — AB (ref 0–149)
VLDL CHOLESTEROL CAL: 43 mg/dL — AB (ref 5–40)

## 2018-07-27 LAB — TSH: TSH: 0.637 u[IU]/mL (ref 0.450–4.500)

## 2018-08-16 ENCOUNTER — Other Ambulatory Visit (HOSPITAL_COMMUNITY): Payer: Self-pay | Admitting: Psychiatry

## 2018-08-16 ENCOUNTER — Other Ambulatory Visit: Payer: Self-pay | Admitting: Family Medicine

## 2018-08-16 DIAGNOSIS — N529 Male erectile dysfunction, unspecified: Secondary | ICD-10-CM

## 2018-08-16 DIAGNOSIS — I10 Essential (primary) hypertension: Secondary | ICD-10-CM

## 2018-08-17 NOTE — Telephone Encounter (Signed)
Last seen 07/26/18

## 2018-08-23 ENCOUNTER — Ambulatory Visit (INDEPENDENT_AMBULATORY_CARE_PROVIDER_SITE_OTHER): Payer: Medicaid Other | Admitting: Psychiatry

## 2018-08-23 ENCOUNTER — Encounter (HOSPITAL_COMMUNITY): Payer: Self-pay | Admitting: Psychiatry

## 2018-08-23 DIAGNOSIS — R5383 Other fatigue: Secondary | ICD-10-CM | POA: Diagnosis not present

## 2018-08-23 DIAGNOSIS — F321 Major depressive disorder, single episode, moderate: Secondary | ICD-10-CM | POA: Diagnosis not present

## 2018-08-23 DIAGNOSIS — Z941 Heart transplant status: Secondary | ICD-10-CM | POA: Diagnosis not present

## 2018-08-23 DIAGNOSIS — R5381 Other malaise: Secondary | ICD-10-CM | POA: Diagnosis not present

## 2018-08-23 DIAGNOSIS — F419 Anxiety disorder, unspecified: Secondary | ICD-10-CM

## 2018-08-23 MED ORDER — VILAZODONE HCL 10 MG PO TABS: 10.0000 mg | ORAL_TABLET | Freq: Every day | ORAL | 3 refills | Status: DC

## 2018-08-23 MED ORDER — ALPRAZOLAM 1 MG PO TABS: ORAL_TABLET | ORAL | 3 refills | Status: DC

## 2018-08-23 MED ORDER — BUPROPION HCL ER (XL) 300 MG PO TB24: 300.0000 mg | ORAL_TABLET | Freq: Every morning | ORAL | 3 refills | Status: DC
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
## 2018-10-07 ENCOUNTER — Ambulatory Visit: Payer: Medicaid Other | Admitting: Nurse Practitioner

## 2018-10-07 ENCOUNTER — Encounter: Payer: Self-pay | Admitting: Nurse Practitioner

## 2018-10-07 VITALS — BP 140/82 | HR 99 | Temp 97.4°F | Ht 68.0 in | Wt 144.0 lb

## 2018-10-07 DIAGNOSIS — J069 Acute upper respiratory infection, unspecified: Secondary | ICD-10-CM | POA: Diagnosis not present

## 2018-10-07 NOTE — Progress Notes (Signed)
   Subjective:    Patient ID: Anthony Price, male    DOB: 1955-11-12, 62 y.o.   MRN: [id number]   Chief Complaint: Sore Throat (Feels like he is starting to get sick); Cough; and Pain in the back of his legs   HPI Patient comes in today c/o achiness in back and legs. Started Saturday . Slight sore throat since Monday. Denies fever or congestion. He said usually when he gets sick he starts getting achy.    Review of Systems  Constitutional: Negative for chills and fever.  HENT: Positive for congestion, rhinorrhea and sinus pressure. Negative for ear pain, sore throat and trouble swallowing.   Respiratory: Positive for cough (slight).   Cardiovascular: Negative.   Gastrointestinal: Negative.   Neurological: Negative for dizziness, light-headedness and headaches.  Psychiatric/Behavioral: Negative.        Objective:   Physical Exam Vitals signs and nursing note reviewed.  Constitutional:      Appearance: He is well-developed.  HENT:     Right Ear: Tympanic membrane and ear canal normal.     Left Ear: Tympanic membrane and ear canal normal.     Mouth/Throat:     Mouth: Mucous membranes are dry.  Neck:     Musculoskeletal: Normal range of motion and neck supple.  Cardiovascular:     Rate and Rhythm: Normal rate.     Heart sounds: Normal heart sounds.  Pulmonary:     Effort: Pulmonary effort is normal.     Breath sounds: Normal breath sounds.  Abdominal:     General: Bowel sounds are normal.     Palpations: Abdomen is soft.  Lymphadenopathy:     Cervical: No cervical adenopathy.  Skin:    General: Skin is warm.  Neurological:     General: No focal deficit present.     Mental Status: He is alert and oriented to person, place, and time.  Psychiatric:        Mood and Affect: Mood normal.        Behavior: Behavior normal.       BP 140/82   Pulse 99   Temp (!) 97.4 F (36.3 C) (Oral)   Ht 5\' 8"  (1.727 m)   Wt 144 lb (65.3 kg)   BMI 21.90 kg/m         Assessment & Plan:  [name] in today with chief complaint of Sore Throat (Feels like he is starting to get sick); Cough; and Pain in the back of his legs   1. Viral upper respiratory infection 1. Take meds as prescribed 2. Use a cool mist humidifier especially during the winter months and when heat has been humid. 3. Use saline nose sprays frequently 4. Saline irrigations of the nose can be very helpful if done frequently.  * 4X daily for 1 week*  * Use of a nettie pot can be helpful with this. Follow directions with this* 5. Drink plenty of fluids 6. Keep thermostat turn down low 7.For any cough or congestion  Use plain Mucinex- regular strength or max strength is fine   * Children- consult with Pharmacist for dosing 8. For fever or aces or pains- take tylenol  appropriate for age and weight.    Mary-Margaret Hassell Done, FNP

## 2018-10-07 NOTE — Patient Instructions (Signed)
1. Take meds as prescribed 2. Use a cool mist humidifier especially during the winter months and when heat has been humid. 3. Use saline nose sprays frequently 4. Saline irrigations of the nose can be very helpful if done frequently.  * 4X daily for 1 week*  * Use of a nettie pot can be helpful with this. Follow directions with this* 5. Drink plenty of fluids 6. Keep thermostat turn down low 7.For any cough or congestion  Use plain Mucinex- regular strength or max strength is fine   * Children- consult with Pharmacist for dosing 8. For fever or aces or pains- take tylenol appropriate for age and weight.

## 2018-11-11 IMAGING — DX DG FOOT COMPLETE 3+V*R*
3 series · 3 of 3 positions shown · non-contrast
Comparison: None.

CLINICAL DATA: right foot pain that started yesterday when he woke
up. The pain started on the bottom of his foot and has moved to the
outer right side of his right foot. Pt has area of redness to his
outer foot. Denies any injury or insect bites.

EXAM:
RIGHT FOOT COMPLETE - 3+ VIEW

[foot ap]
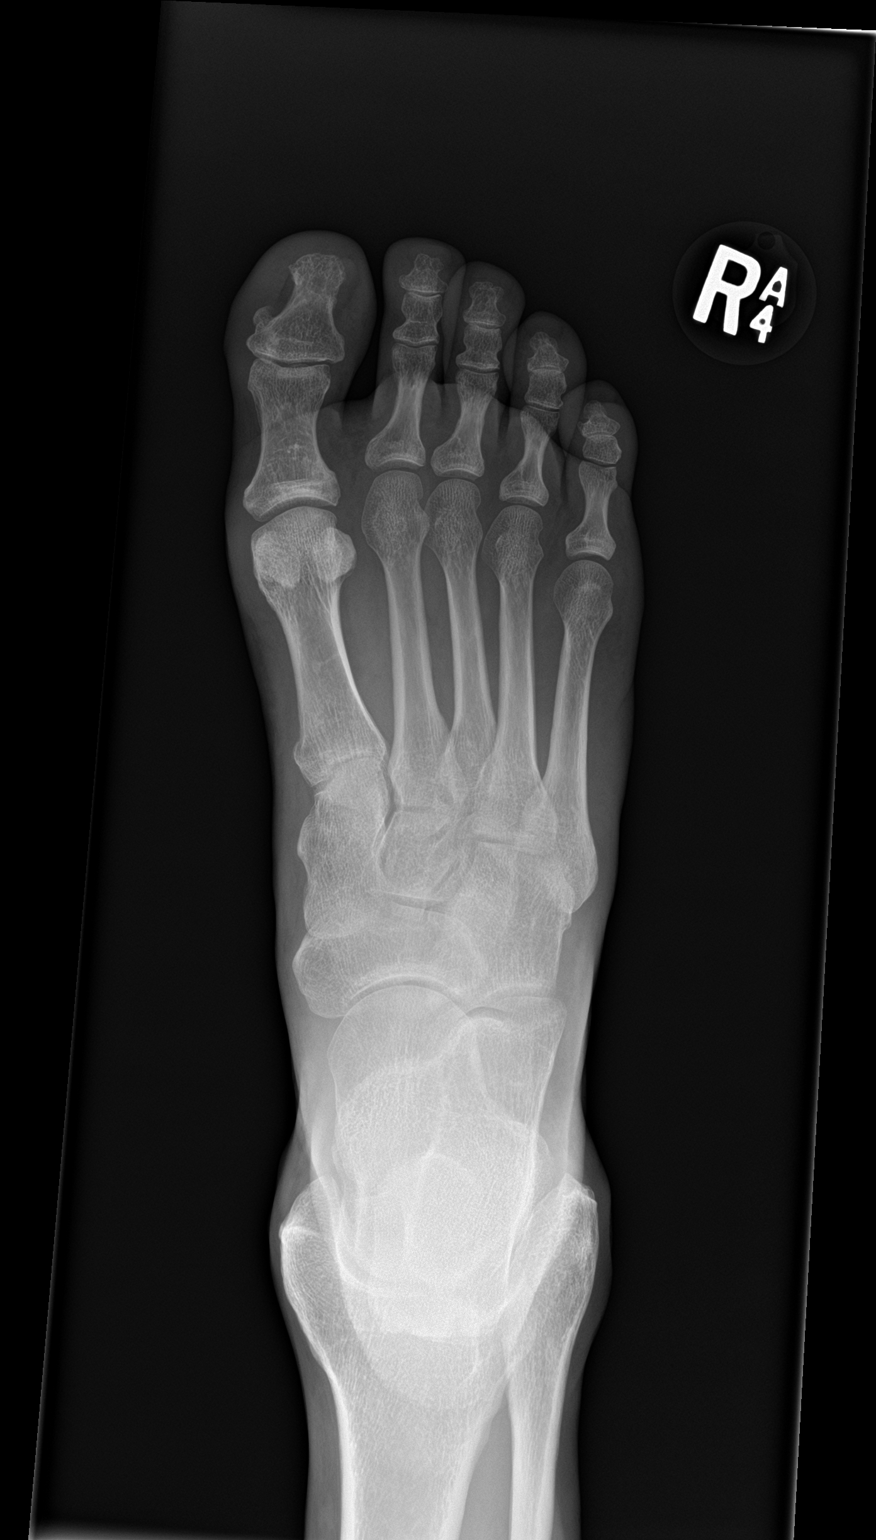

[foot obl]
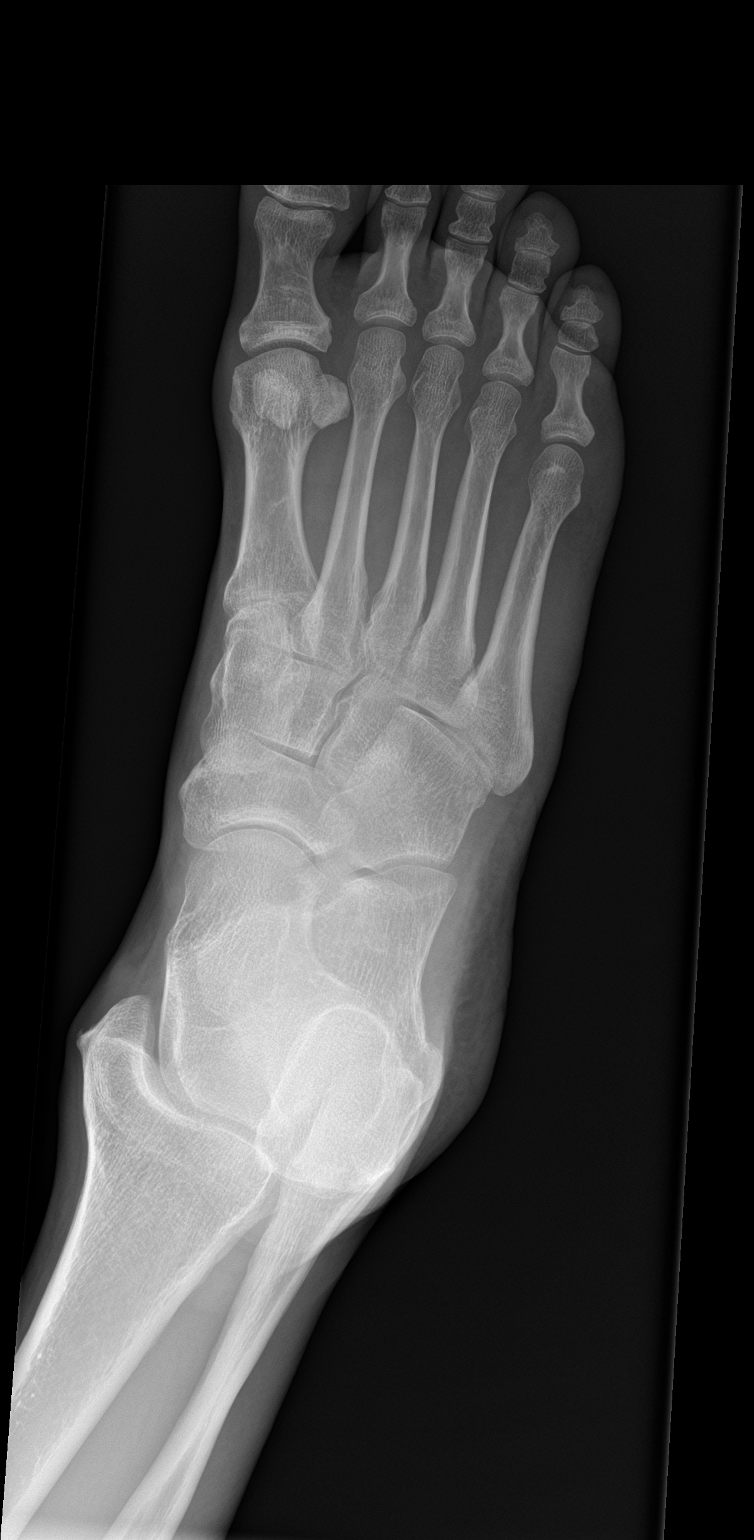

[foot lat]
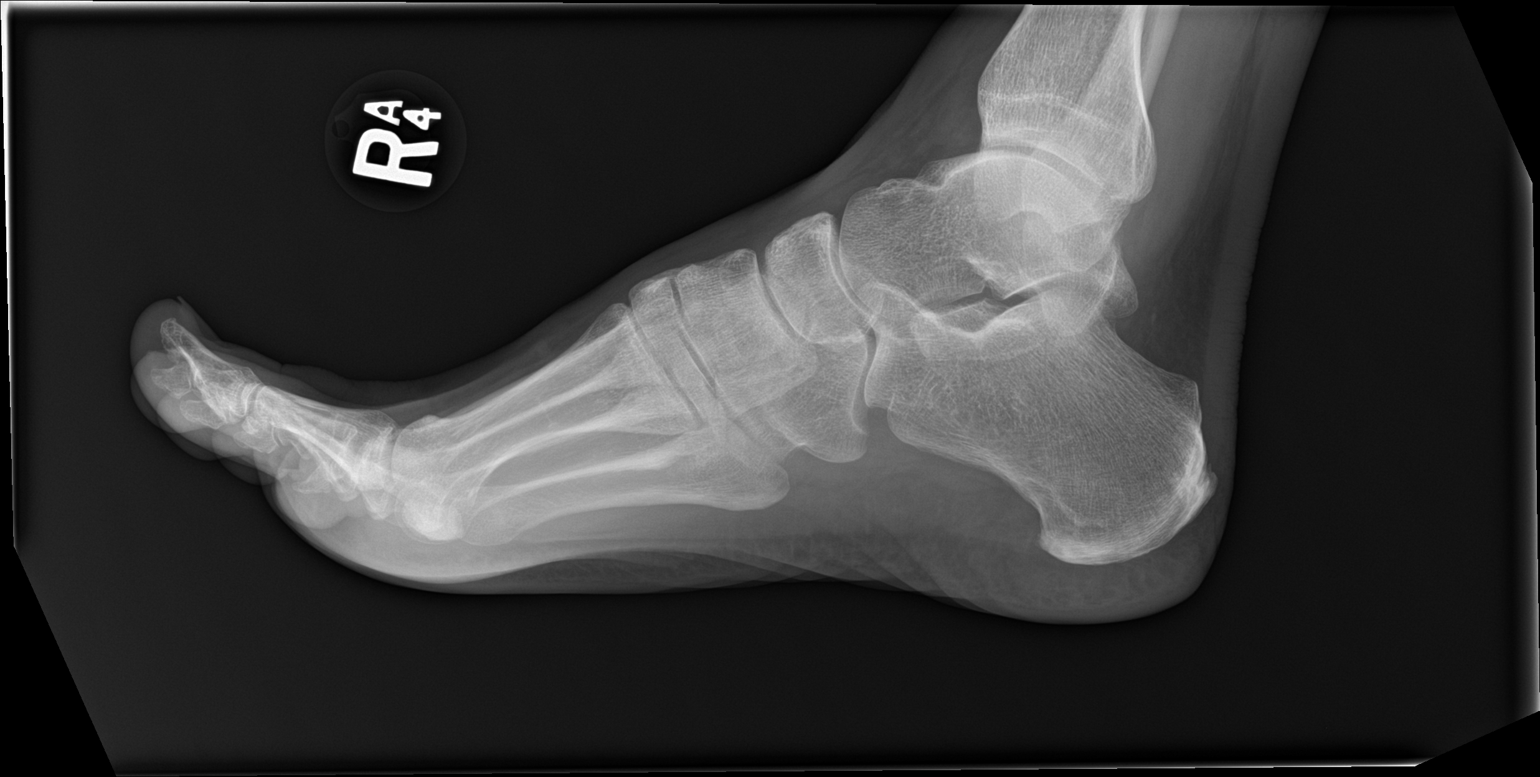

[3 of 3 positions shown; findings below may reference images not displayed]

FINDINGS: Osseous alignment is normal. Bone mineralization is normal. No
fracture line or displaced fracture fragment seen. No acute or
suspicious osseous lesion. No destructive change to suggest
osteomyelitis. Adjacent soft tissues are unremarkable.
IMPRESSION: Negative.

## 2018-12-07 ENCOUNTER — Other Ambulatory Visit (HOSPITAL_COMMUNITY): Payer: Self-pay | Admitting: Psychiatry

## 2018-12-07 DIAGNOSIS — F321 Major depressive disorder, single episode, moderate: Secondary | ICD-10-CM

## 2018-12-07 MED ORDER — BUPROPION HCL ER (XL) 300 MG PO TB24: 300.0000 mg | ORAL_TABLET | Freq: Every morning | ORAL | 3 refills | Status: DC

## 2018-12-15 DIAGNOSIS — E785 Hyperlipidemia, unspecified: Secondary | ICD-10-CM | POA: Diagnosis not present

## 2018-12-15 DIAGNOSIS — E559 Vitamin D deficiency, unspecified: Secondary | ICD-10-CM | POA: Diagnosis not present

## 2018-12-15 DIAGNOSIS — Z79899 Other long term (current) drug therapy: Secondary | ICD-10-CM | POA: Diagnosis not present

## 2018-12-15 DIAGNOSIS — Z941 Heart transplant status: Secondary | ICD-10-CM | POA: Diagnosis not present

## 2018-12-15 DIAGNOSIS — Z48298 Encounter for aftercare following other organ transplant: Secondary | ICD-10-CM | POA: Diagnosis not present

## 2018-12-15 DIAGNOSIS — Z125 Encounter for screening for malignant neoplasm of prostate: Secondary | ICD-10-CM | POA: Diagnosis not present

## 2018-12-15 DIAGNOSIS — R7989 Other specified abnormal findings of blood chemistry: Secondary | ICD-10-CM | POA: Diagnosis not present

## 2018-12-15 DIAGNOSIS — E119 Type 2 diabetes mellitus without complications: Secondary | ICD-10-CM | POA: Diagnosis not present

## 2018-12-24 ENCOUNTER — Ambulatory Visit (HOSPITAL_COMMUNITY): Payer: Medicaid Other | Admitting: Psychiatry

## 2018-12-27 ENCOUNTER — Ambulatory Visit (INDEPENDENT_AMBULATORY_CARE_PROVIDER_SITE_OTHER): Payer: Medicaid Other | Admitting: Psychiatry

## 2018-12-27 ENCOUNTER — Encounter (HOSPITAL_COMMUNITY): Payer: Self-pay | Admitting: Psychiatry

## 2018-12-27 DIAGNOSIS — F321 Major depressive disorder, single episode, moderate: Secondary | ICD-10-CM | POA: Diagnosis not present

## 2018-12-27 MED ORDER — BUPROPION HCL ER (XL) 300 MG PO TB24: 300.0000 mg | ORAL_TABLET | Freq: Every morning | ORAL | 3 refills | Status: DC

## 2018-12-27 MED ORDER — ALPRAZOLAM 1 MG PO TABS: ORAL_TABLET | ORAL | 3 refills | Status: DC

## 2018-12-27 MED ORDER — VILAZODONE HCL 10 MG PO TABS: 10.0000 mg | ORAL_TABLET | Freq: Every day | ORAL | 3 refills | Status: DC
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
## 2018-12-28 DIAGNOSIS — R42 Dizziness and giddiness: Secondary | ICD-10-CM | POA: Diagnosis not present

## 2018-12-28 DIAGNOSIS — R140 Abdominal distension (gaseous): Secondary | ICD-10-CM | POA: Diagnosis not present

## 2018-12-28 DIAGNOSIS — I129 Hypertensive chronic kidney disease with stage 1 through stage 4 chronic kidney disease, or unspecified chronic kidney disease: Secondary | ICD-10-CM | POA: Diagnosis not present

## 2018-12-28 DIAGNOSIS — E119 Type 2 diabetes mellitus without complications: Secondary | ICD-10-CM | POA: Diagnosis not present

## 2018-12-28 DIAGNOSIS — K219 Gastro-esophageal reflux disease without esophagitis: Secondary | ICD-10-CM | POA: Diagnosis not present

## 2018-12-28 DIAGNOSIS — N183 Chronic kidney disease, stage 3 (moderate): Secondary | ICD-10-CM | POA: Diagnosis not present

## 2018-12-28 DIAGNOSIS — N189 Chronic kidney disease, unspecified: Secondary | ICD-10-CM | POA: Diagnosis not present

## 2018-12-28 DIAGNOSIS — Z941 Heart transplant status: Secondary | ICD-10-CM | POA: Diagnosis not present

## 2018-12-28 DIAGNOSIS — F329 Major depressive disorder, single episode, unspecified: Secondary | ICD-10-CM | POA: Diagnosis not present

## 2018-12-28 DIAGNOSIS — Z823 Family history of stroke: Secondary | ICD-10-CM | POA: Diagnosis not present

## 2018-12-28 DIAGNOSIS — I255 Ischemic cardiomyopathy: Secondary | ICD-10-CM | POA: Diagnosis not present

## 2018-12-28 DIAGNOSIS — F418 Other specified anxiety disorders: Secondary | ICD-10-CM | POA: Diagnosis not present

## 2018-12-28 DIAGNOSIS — R51 Headache: Secondary | ICD-10-CM | POA: Diagnosis not present

## 2018-12-28 DIAGNOSIS — G8929 Other chronic pain: Secondary | ICD-10-CM | POA: Diagnosis not present

## 2018-12-28 DIAGNOSIS — M549 Dorsalgia, unspecified: Secondary | ICD-10-CM | POA: Diagnosis not present

## 2018-12-28 DIAGNOSIS — Z87891 Personal history of nicotine dependence: Secondary | ICD-10-CM | POA: Diagnosis not present

## 2018-12-28 DIAGNOSIS — E785 Hyperlipidemia, unspecified: Secondary | ICD-10-CM | POA: Diagnosis not present

## 2018-12-28 DIAGNOSIS — Z955 Presence of coronary angioplasty implant and graft: Secondary | ICD-10-CM | POA: Diagnosis not present

## 2018-12-30 ENCOUNTER — Encounter: Payer: Self-pay | Admitting: Internal Medicine

## 2018-12-30 ENCOUNTER — Telehealth: Payer: Self-pay | Admitting: Gastroenterology

## 2018-12-30 NOTE — Telephone Encounter (Signed)
PATIENT WAS SENT 2 LETTERS TO CALL AND SCHEDULE, I HAVE ASKED JULIE IF THIS CAN BE TRIAGED OR IF HE NEEDS AN OFFICE VISIT.

## 2018-12-30 NOTE — Telephone Encounter (Signed)
Last tcs was done with propofol. He will need an office visit.

## 2018-12-30 NOTE — Telephone Encounter (Signed)
At last OV with Dr. Oneida Alar on 06/18/2016 pt was told he needed his next one in 6 months.

## 2018-12-30 NOTE — Telephone Encounter (Signed)
SCHEDULED PATIENT AND SENT LETTER  °

## 2018-12-30 NOTE — Telephone Encounter (Signed)
[phone] PATIENT NUMBER....Anthony KitchenNICOLE FROM [facility] CALLED INQUIRING WHEN THE PATIENT WAS SUPPOSED TO HAVE ANOTHER COLONOSCOPY.  THERE ARE 2 RECALLS IN THE SYSTEM   PLEASE LET ME KNOW WHERN TO SCHEDULE

## 2019-01-03 NOTE — Telephone Encounter (Signed)
REVIEWED. AGREE. NO ADDITIONAL RECOMMENDATIONS. 

## 2019-01-04 DIAGNOSIS — Z48298 Encounter for aftercare following other organ transplant: Secondary | ICD-10-CM | POA: Diagnosis not present

## 2019-01-04 DIAGNOSIS — R7989 Other specified abnormal findings of blood chemistry: Secondary | ICD-10-CM | POA: Diagnosis not present

## 2019-01-04 DIAGNOSIS — Z79899 Other long term (current) drug therapy: Secondary | ICD-10-CM | POA: Diagnosis not present

## 2019-01-04 DIAGNOSIS — Z941 Heart transplant status: Secondary | ICD-10-CM | POA: Diagnosis not present

## 2019-01-25 ENCOUNTER — Encounter: Payer: Self-pay | Admitting: Family Medicine

## 2019-01-25 ENCOUNTER — Other Ambulatory Visit: Payer: Self-pay

## 2019-01-25 ENCOUNTER — Ambulatory Visit (INDEPENDENT_AMBULATORY_CARE_PROVIDER_SITE_OTHER): Payer: Medicaid Other | Admitting: Family Medicine

## 2019-01-25 DIAGNOSIS — N182 Chronic kidney disease, stage 2 (mild): Secondary | ICD-10-CM | POA: Diagnosis not present

## 2019-01-25 DIAGNOSIS — E1122 Type 2 diabetes mellitus with diabetic chronic kidney disease: Secondary | ICD-10-CM

## 2019-01-25 DIAGNOSIS — E1159 Type 2 diabetes mellitus with other circulatory complications: Secondary | ICD-10-CM

## 2019-01-25 DIAGNOSIS — I152 Hypertension secondary to endocrine disorders: Secondary | ICD-10-CM

## 2019-01-25 DIAGNOSIS — I10 Essential (primary) hypertension: Secondary | ICD-10-CM | POA: Diagnosis not present

## 2019-01-25 DIAGNOSIS — K219 Gastro-esophageal reflux disease without esophagitis: Secondary | ICD-10-CM

## 2019-01-25 MED ORDER — ATORVASTATIN CALCIUM 40 MG PO TABS: 40.0000 mg | ORAL_TABLET | Freq: Every day | ORAL | 3 refills | Status: DC

## 2019-01-25 MED ORDER — AMLODIPINE BESYLATE 2.5 MG PO TABS: 2.5000 mg | ORAL_TABLET | Freq: Every day | ORAL | 3 refills | Status: DC

## 2019-01-25 NOTE — Progress Notes (Signed)
Virtual Visit via telephone Note  I connected with Anthony Price on 01/25/19 at 1105 by telephone and verified that I am speaking with the correct person using two identifiers. Anthony Price is currently located at home and no other people are currently with her during visit. The provider, Fransisca Kaufmann Dettinger, MD is located in their office at time of visit.  Call ended at 1121  I discussed the limitations, risks, security and privacy concerns of performing an evaluation and management service by telephone and the availability of in person appointments. I also discussed with the patient that there may be a patient responsible charge related to this service. The patient expressed understanding and agreed to proceed.   History and Present Illness: Hypertension Patient is currently on amlodipine and carvedilol and ramipril, and their blood pressure today is doing well at home and other offices. Patient denies any lightheadedness or dizziness. Patient denies headaches, blurred vision, chest pains, shortness of breath, or weakness. Denies any side effects from medication and is content with current medication.   GERD Patient is currently on nexium.  She denies any major symptoms or abdominal pain or belching or burping. She denies any blood in her stool or lightheadedness or dizziness.   Type 2 diabetes mellitus Patient comes in today for recheck of his diabetes. Patient has been currently taking metformin, . Patient is currently on an ACE inhibitor/ARB. Patient has not seen an ophthalmologist this year. Patient denies any issues with their feet.   No diagnosis found.  Outpatient Encounter Medications as of 01/25/2019  Medication Sig  . ACCU-CHEK SOFTCLIX LANCETS lancets Test once qd. DX E11.9  . acetaminophen (TYLENOL) 500 MG tablet Take 500 mg by mouth every 6 (six) hours as needed. Pain  . ALPRAZolam (XANAX) 1 MG tablet TAKE (1) TABLET BY MOUTH (3) TIMES DAILY AS NEEDED FOR ANXIETY.   Marland Kitchen amLODipine (NORVASC) 2.5 MG tablet TAKE 1 TABLET BY MOUTH ONCE DAILY.  Marland Kitchen aspirin EC 81 MG tablet Take 81 mg by mouth daily.  Marland Kitchen atorvastatin (LIPITOR) 40 MG tablet Take 40 mg by mouth daily.  . Blood Glucose Monitoring Suppl (ACCU-CHEK AVIVA PLUS) w/Device KIT Test qd. DX E11.9  . buPROPion (WELLBUTRIN XL) 300 MG 24 hr tablet Take 1 tablet (300 mg total) by mouth every morning.  . carvedilol (COREG) 12.5 MG tablet Take 1 tablet (12.5 mg total) by mouth 2 (two) times daily.  Marland Kitchen esomeprazole (NEXIUM) 20 MG capsule TAKE 1 CAPSULE BY MOUTH TWICE DAILY BEFORE A MEAL.  Marland Kitchen glucose blood (ACCU-CHEK AVIVA PLUS) test strip Test once qd. DX E11.9  . magnesium oxide (MAG-OX) 400 MG tablet Take 400 mg by mouth daily.  . metFORMIN (GLUCOPHAGE) 500 MG tablet TAKE ONE TABLET BY MOUTH 2 TIMES A DAY WITH A MEAL.  . mycophenolate (CELLCEPT) 500 MG tablet Take 500-1,000 mg by mouth 2 (two) times daily. Patient takes 1 tablet (531m) in the morning and 2 tablets(1001m  . Probiotic Product (PROBIOTIC DAILY PO) Take by mouth daily.  . ramipril (ALTACE) 2.5 MG capsule Take 2.5 mg by mouth daily.  . sildenafil (REVATIO) 20 MG tablet TAKE 2 AND 1/2 TABLETS BY MOUTH DAILY AS NEEDED.  . Marland Kitchenacrolimus (PROGRAF) 1 MG capsule Take 1 mg by mouth. 1 mg in the am and 0.5 mg in the pm  . Vilazodone HCl (VIIBRYD) 10 MG TABS Take 1 tablet (10 mg total) by mouth daily.   No facility-administered encounter medications on file as of 01/25/2019.  Review of Systems  Constitutional: Negative for chills and fever.  Eyes: Negative for discharge.  Respiratory: Negative for shortness of breath and wheezing.   Cardiovascular: Negative for chest pain and leg swelling.  Musculoskeletal: Negative for back pain and gait problem.  Skin: Negative for rash.  Neurological: Negative for dizziness, weakness, light-headedness and numbness.  All other systems reviewed and are negative.   Observations/Objective: Patient sounds comfortable and  in no acute distress  Assessment and Plan: Problem List Items Addressed This Visit      Cardiovascular and Mediastinum   Hypertension associated with diabetes ([place]) - Primary   Relevant Medications   amLODipine (NORVASC) 2.5 MG tablet   atorvastatin (LIPITOR) 40 MG tablet     Digestive   GERD (gastroesophageal reflux disease)     Endocrine   Diabetes type 2, controlled (HCC)   Relevant Medications   atorvastatin (LIPITOR) 40 MG tablet    Other Visit Diagnoses    Essential hypertension       Relevant Medications   amLODipine (NORVASC) 2.5 MG tablet   atorvastatin (LIPITOR) 40 MG tablet       Follow Up Instructions:  3 months recheck diabetes   I discussed the assessment and treatment plan with the patient. The patient was provided an opportunity to ask questions and all were answered. The patient agreed with the plan and demonstrated an understanding of the instructions.   The patient was advised to call back or seek an in-person evaluation if the symptoms worsen or if the condition fails to improve as anticipated.  The above assessment and management plan was discussed with the patient. The patient verbalized understanding of and has agreed to the management plan. Patient is aware to call the clinic if symptoms persist or worsen. Patient is aware when to return to the clinic for a follow-up visit. Patient educated on when it is appropriate to go to the emergency department.    I provided 16 minutes of non-face-to-face time during this encounter.    [name], MD

## 2019-02-21 NOTE — Telephone Encounter (Signed)
PLEASE CALL NICOLE, [facility]. PT WAS SUPPOSE TO HAVE TCS AUG 2017 FOR > 3 SIMPLE ADENOMAS REMOVED IN AUG 2013. HE HAS FAILED TO SCHEDULE TCS.

## 2019-02-22 NOTE — Telephone Encounter (Signed)
Pt is scheduled for OV on Friday 03/04/2019. I have left messages for him to call because the office will be closed on Fridays the month of May. Do I still need to reschedule him as a Virtual visit? Please advise.

## 2019-02-22 NOTE — Telephone Encounter (Signed)
RCS FOR A VIRTUAL VISIT FIRST AVAILABLE.

## 2019-02-22 NOTE — Telephone Encounter (Signed)
Pt is aware of telephone visit with SF on 5/5 at 2pm

## 2019-02-28 ENCOUNTER — Ambulatory Visit: Payer: Medicaid Other | Admitting: Gastroenterology

## 2019-03-01 ENCOUNTER — Telehealth: Payer: Self-pay | Admitting: *Deleted

## 2019-03-01 ENCOUNTER — Encounter: Payer: Self-pay | Admitting: Gastroenterology

## 2019-03-01 ENCOUNTER — Ambulatory Visit (INDEPENDENT_AMBULATORY_CARE_PROVIDER_SITE_OTHER): Payer: Medicaid Other | Admitting: Gastroenterology

## 2019-03-01 ENCOUNTER — Other Ambulatory Visit: Payer: Self-pay

## 2019-03-01 DIAGNOSIS — D369 Benign neoplasm, unspecified site: Secondary | ICD-10-CM

## 2019-03-01 DIAGNOSIS — K6389 Other specified diseases of intestine: Secondary | ICD-10-CM

## 2019-03-01 NOTE — Assessment & Plan Note (Signed)
SYMPTOMS CONTROLLED/RESOLVED.  CONTINUE TO MONITOR SYMPTOMS. MAY NEED REPEAT AUGMENT IF SX RE-OCCUR.
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
## 2019-03-01 NOTE — Telephone Encounter (Signed)
Per CC Chart from sl[facility]:          CALL PT IN AUG 2020 TO [place]

## 2019-03-01 NOTE — Assessment & Plan Note (Signed)
NO WARNING SIGNS/SYMPTOMS-IN 2013: > 3 SIMPLE ADENOMAS REMOVED. PT PAST DUE FOR TCS.  RECOMMEND TCS IN SEP 2017 BUT PT DECLINED TO SCHEDULE AND CONTINUES TO BE OVERDUE FOR SURVEILLANCE. EXPLAINED WHY HE NEEDS A TCS. PT VOICED HIS UNDERSTANDING. TRIAGE FOR TCS W/ MAC IN AUG  OR SEP 2020 DUE TO COVID 19 TRESTRICTIONS. FOLLOW UP IN THE OFFICE WILL BE SCHEDULED IF NEEDED AFTER ENDOSCOPY.

## 2019-03-02 NOTE — Telephone Encounter (Signed)
Spoke with Dr. Oneida Alar.  Pt will only need a nurse visit to update information.  Please schedule in July.

## 2019-03-03 ENCOUNTER — Encounter: Payer: Self-pay | Admitting: Gastroenterology

## 2019-03-03 NOTE — Telephone Encounter (Signed)
NV made and appt letter mailed

## 2019-03-04 ENCOUNTER — Ambulatory Visit: Payer: Medicaid Other | Admitting: Gastroenterology

## 2019-03-25 DIAGNOSIS — Z48298 Encounter for aftercare following other organ transplant: Secondary | ICD-10-CM | POA: Diagnosis not present

## 2019-03-25 DIAGNOSIS — Z941 Heart transplant status: Secondary | ICD-10-CM | POA: Diagnosis not present

## 2019-03-25 DIAGNOSIS — E559 Vitamin D deficiency, unspecified: Secondary | ICD-10-CM | POA: Diagnosis not present

## 2019-03-25 DIAGNOSIS — Z79899 Other long term (current) drug therapy: Secondary | ICD-10-CM | POA: Diagnosis not present

## 2019-04-19 DIAGNOSIS — Z79899 Other long term (current) drug therapy: Secondary | ICD-10-CM | POA: Diagnosis not present

## 2019-04-19 DIAGNOSIS — Z941 Heart transplant status: Secondary | ICD-10-CM | POA: Diagnosis not present

## 2019-04-19 DIAGNOSIS — E119 Type 2 diabetes mellitus without complications: Secondary | ICD-10-CM | POA: Diagnosis not present

## 2019-04-19 DIAGNOSIS — R7989 Other specified abnormal findings of blood chemistry: Secondary | ICD-10-CM | POA: Diagnosis not present

## 2019-04-19 DIAGNOSIS — Z48298 Encounter for aftercare following other organ transplant: Secondary | ICD-10-CM | POA: Diagnosis not present

## 2019-04-25 ENCOUNTER — Other Ambulatory Visit (HOSPITAL_COMMUNITY): Payer: Self-pay | Admitting: Psychiatry

## 2019-04-25 DIAGNOSIS — F321 Major depressive disorder, single episode, moderate: Secondary | ICD-10-CM

## 2019-04-27 ENCOUNTER — Encounter: Payer: Self-pay | Admitting: Family Medicine

## 2019-04-27 ENCOUNTER — Ambulatory Visit: Payer: Medicaid Other | Admitting: Family Medicine

## 2019-04-27 ENCOUNTER — Ambulatory Visit (INDEPENDENT_AMBULATORY_CARE_PROVIDER_SITE_OTHER): Payer: Medicaid Other | Admitting: Family Medicine

## 2019-04-27 DIAGNOSIS — I10 Essential (primary) hypertension: Secondary | ICD-10-CM | POA: Diagnosis not present

## 2019-04-27 DIAGNOSIS — K219 Gastro-esophageal reflux disease without esophagitis: Secondary | ICD-10-CM

## 2019-04-27 DIAGNOSIS — N182 Chronic kidney disease, stage 2 (mild): Secondary | ICD-10-CM | POA: Diagnosis not present

## 2019-04-27 DIAGNOSIS — E1122 Type 2 diabetes mellitus with diabetic chronic kidney disease: Secondary | ICD-10-CM | POA: Diagnosis not present

## 2019-04-27 DIAGNOSIS — E1159 Type 2 diabetes mellitus with other circulatory complications: Secondary | ICD-10-CM

## 2019-04-27 MED ORDER — VICTOZA 18 MG/3ML ~~LOC~~ SOPN: PEN_INJECTOR | SUBCUTANEOUS | 0 refills | Status: DC

## 2019-04-27 MED ORDER — "PEN NEEDLES 5/16"" 31G X 8 MM MISC": 1.0000 | Freq: Every day | 3 refills | Status: DC

## 2019-04-27 NOTE — Progress Notes (Signed)
Virtual Visit via telephone Note  I connected with Anthony Price on 04/27/19 at 1020 by telephone and verified that I am speaking with the correct person using two identifiers. Anthony Price is currently located at home and wife donna are currently with her during visit. The provider, Fransisca Kaufmann Faun Mcqueen, MD is located in their office at time of visit.  Call ended at 1043  I discussed the limitations, risks, security and privacy concerns of performing an evaluation and management service by telephone and the availability of in person appointments. I also discussed with the patient that there may be a patient responsible charge related to this service. The patient expressed understanding and agreed to proceed.   History and Present Illness: Type 2 diabetes mellitus Patient comes in today for recheck of his diabetes. Patient has been currently taking metformin 500 bid and bs 124-150. Patient is currently on an ACE inhibitor/ARB. Patient has not seen an ophthalmologist this year. Patient denies any issues with their feet.   GERD Patient is currently on nexium.  She denies any major symptoms or abdominal pain or belching or burping. She denies any blood in her stool or lightheadedness or dizziness.   Hypertension Patient is currently on amlodipine and carvedilol and ramipril, and their blood pressure today is 119/78. Patient has sometimes gets orthostatic dizziness. Patient denies headaches, blurred vision, chest pains, shortness of breath, or weakness. Denies any side effects from medication and is content with current medication.   No diagnosis found.  Outpatient Encounter Medications as of 04/27/2019  Medication Sig  . ACCU-CHEK SOFTCLIX LANCETS lancets Test once qd. DX E11.9  . acetaminophen (TYLENOL) 500 MG tablet Take 500 mg by mouth every 6 (six) hours as needed. Pain  . ALPRAZolam (XANAX) 1 MG tablet TAKE (1) TABLET BY MOUTH (3) TIMES DAILY AS NEEDED FOR ANXIETY.  Marland Kitchen amLODipine  (NORVASC) 2.5 MG tablet Take 1 tablet (2.5 mg total) by mouth daily.  Marland Kitchen aspirin EC 81 MG tablet Take 81 mg by mouth daily.  Marland Kitchen atorvastatin (LIPITOR) 40 MG tablet Take 1 tablet (40 mg total) by mouth daily.  . Blood Glucose Monitoring Suppl (ACCU-CHEK AVIVA PLUS) w/Device KIT Test qd. DX E11.9  . buPROPion (WELLBUTRIN XL) 300 MG 24 hr tablet TAKE 1 TABLET BY MOUTH EVERY MORNING.  . carvedilol (COREG) 12.5 MG tablet Take 1 tablet (12.5 mg total) by mouth 2 (two) times daily.  . cholecalciferol (VITAMIN D3) 25 MCG (1000 UT) tablet Take 1,000 Units by mouth daily.  Marland Kitchen esomeprazole (NEXIUM) 20 MG capsule TAKE 1 CAPSULE BY MOUTH TWICE DAILY BEFORE A MEAL.  Marland Kitchen glucose blood (ACCU-CHEK AVIVA PLUS) test strip Test once qd. DX E11.9  . magnesium 30 MG tablet Take 30 mg by mouth 2 (two) times daily.  . magnesium oxide (MAG-OX) 400 MG tablet Take 400 mg by mouth daily.  . metFORMIN (GLUCOPHAGE) 500 MG tablet TAKE ONE TABLET BY MOUTH 2 TIMES A DAY WITH A MEAL.  . mycophenolate (CELLCEPT) 500 MG tablet Take 500-1,000 mg by mouth 2 (two) times daily. Patient takes 1 tablet (518m) in the morning and 2 tablets(10045m at night  . Probiotic Product (PROBIOTIC DAILY PO) Take by mouth daily.  . ramipril (ALTACE) 2.5 MG capsule Take 2.5 mg by mouth daily.  . sildenafil (REVATIO) 20 MG tablet TAKE 2 AND 1/2 TABLETS BY MOUTH DAILY AS NEEDED.  . Marland Kitchenacrolimus (PROGRAF) 1 MG capsule Take 1 mg by mouth. 1 mg in the am and 0.5 mg  in the pm  . VIIBRYD 10 MG TABS TAKE ONE TABLET BY MOUTH DAILY.   No facility-administered encounter medications on file as of 04/27/2019.     Review of Systems  Constitutional: Negative for chills and fever.  Respiratory: Negative for shortness of breath and wheezing.   Cardiovascular: Negative for chest pain and leg swelling.  Musculoskeletal: Negative for back pain and gait problem.  Skin: Negative for rash.  Neurological: Positive for dizziness and light-headedness. Negative for weakness,  numbness and headaches.  All other systems reviewed and are negative.   Observations/Objective: Patient sounds comfortable and in no acute distress  Assessment and Plan: Problem List Items Addressed This Visit      Cardiovascular and Mediastinum   Hypertension associated with diabetes ([place])   Relevant Medications   liraglutide (VICTOZA) 18 MG/3ML SOPN     Digestive   GERD (gastroesophageal reflux disease)     Endocrine   Diabetes type 2, controlled ([place]) - Primary   Relevant Medications   liraglutide (VICTOZA) 18 MG/3ML SOPN   Insulin Pen Needle (PEN NEEDLES 31GX5/16") 31G X 8 MM MISC       Follow Up Instructions: Will start victoza for diabetes  Stop amlodipine because of orthostatic hypotension and dizziness when he stands up His a1c in [facility] was 6.9 Continue metformin at current dose, renal function is now stage 4.  Continue ramipril and carvedilol  Patient continues to see nephrology and transplant care team for his kidneys  Follow-up in 3 months   I discussed the assessment and treatment plan with the patient. The patient was provided an opportunity to ask questions and all were answered. The patient agreed with the plan and demonstrated an understanding of the instructions.   The patient was advised to call back or seek an in-person evaluation if the symptoms worsen or if the condition fails to improve as anticipated.  The above assessment and management plan was discussed with the patient. The patient verbalized understanding of and has agreed to the management plan. Patient is aware to call the clinic if symptoms persist or worsen. Patient is aware when to return to the clinic for a follow-up visit. Patient educated on when it is appropriate to go to the emergency department.    I provided 23 minutes of non-face-to-face time during this encounter.    [name], MD

## 2019-04-28 ENCOUNTER — Ambulatory Visit (INDEPENDENT_AMBULATORY_CARE_PROVIDER_SITE_OTHER): Payer: Medicaid Other | Admitting: Psychiatry

## 2019-04-28 ENCOUNTER — Other Ambulatory Visit: Payer: Self-pay

## 2019-04-28 ENCOUNTER — Encounter (HOSPITAL_COMMUNITY): Payer: Self-pay | Admitting: Psychiatry

## 2019-04-28 DIAGNOSIS — F321 Major depressive disorder, single episode, moderate: Secondary | ICD-10-CM

## 2019-04-28 MED ORDER — BUPROPION HCL ER (XL) 300 MG PO TB24: 300.0000 mg | ORAL_TABLET | Freq: Every morning | ORAL | 2 refills | Status: DC

## 2019-04-28 MED ORDER — ALPRAZOLAM 1 MG PO TABS: 1.0000 mg | ORAL_TABLET | Freq: Three times a day (TID) | ORAL | 2 refills | Status: DC | PRN

## 2019-04-28 MED ORDER — VIIBRYD 10 MG PO TABS: 10.0000 mg | ORAL_TABLET | Freq: Every day | ORAL | 2 refills | Status: DC
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
## 2019-05-10 DIAGNOSIS — Z941 Heart transplant status: Secondary | ICD-10-CM | POA: Diagnosis not present

## 2019-05-16 ENCOUNTER — Ambulatory Visit (INDEPENDENT_AMBULATORY_CARE_PROVIDER_SITE_OTHER): Payer: Self-pay | Admitting: *Deleted

## 2019-05-16 DIAGNOSIS — N189 Chronic kidney disease, unspecified: Secondary | ICD-10-CM | POA: Diagnosis not present

## 2019-05-16 DIAGNOSIS — Z86010 Personal history of colonic polyps: Secondary | ICD-10-CM

## 2019-05-16 NOTE — Progress Notes (Signed)
Called pt to triage him per [facility]'s 03/01/19 ov note.  Pt will need TCS w/ MAC.  Explained to pt that our Aug and Sept schedules were full right now but we would call him if we get a cancellation.  Pt also made aware that once Oct propofol procedure schedules are released, that we would call him.  Pt voiced understanding.

## 2019-05-16 NOTE — Progress Notes (Addendum)
Gastroenterology Pre-Procedure Review  Request Date: 05/16/2019 Requesting Physician: Last TCS 06/15/2012 done by Dr.Fields, Multiple sessile polyps 3-7 mm, Internal hemorrhoids  PATIENT REVIEW QUESTIONS: The patient responded to the following health history questions as indicated:    1. Diabetes Melitis: Yes 2. Joint replacements in the past 12 months: No 3. Major health problems in the past 3 months: No 4. Has an artificial valve or MVP: No 5. Has a defibrillator: No, but had heart transplant in 2000 6. Has been advised in past to take antibiotics in advance of a procedure like teeth cleaning: No 7. Family history of colon cancer: No  8. Alcohol Use: No 9. History of sleep apnea: No  10. History of coronary artery or other vascular stents placed within the last 12 months: No 11. History of any prior anesthesia complications: No    MEDICATIONS & ALLERGIES:    Patient reports the following regarding taking any blood thinners:   Plavix? No Aspirin? Yes Coumadin? No Brilinta? No Xarelto? No Eliquis? No Pradaxa? No Savaysa? No Effient? No  Patient confirms/reports the following medications:  Current Outpatient Medications  Medication Sig Dispense Refill  . ACCU-CHEK SOFTCLIX LANCETS lancets Test once qd. DX E11.9 100 each 5  . acetaminophen (TYLENOL) 500 MG tablet Take 500 mg by mouth every 6 (six) hours as needed. Pain    . ALPRAZolam (XANAX) 1 MG tablet Take 1 tablet (1 mg total) by mouth 3 (three) times daily as needed for anxiety. 90 tablet 2  . aspirin EC 81 MG tablet Take 81 mg by mouth daily.    Marland Kitchen atorvastatin (LIPITOR) 40 MG tablet Take 1 tablet (40 mg total) by mouth daily. 90 tablet 3  . Blood Glucose Monitoring Suppl (ACCU-CHEK AVIVA PLUS) w/Device KIT Test qd. DX E11.9 1 kit 0  . buPROPion (WELLBUTRIN XL) 300 MG 24 hr tablet Take 1 tablet (300 mg total) by mouth every morning. 30 tablet 2  . carvedilol (COREG) 12.5 MG tablet Take 1 tablet (12.5 mg total) by mouth 2  (two) times daily. 180 tablet 3  . cholecalciferol (VITAMIN D3) 25 MCG (1000 UT) tablet Take 1,000 Units by mouth daily.    Marland Kitchen esomeprazole (NEXIUM) 20 MG capsule TAKE 1 CAPSULE BY MOUTH TWICE DAILY BEFORE A MEAL. 180 capsule 3  . glucose blood (ACCU-CHEK AVIVA PLUS) test strip Test once qd. DX E11.9 100 each 5  . Insulin Pen Needle (PEN NEEDLES 31GX5/16") 31G X 8 MM MISC 1 each by Does not apply route daily. 90 each 3  . magnesium oxide (MAG-OX) 400 MG tablet Take 400 mg by mouth daily.    . metFORMIN (GLUCOPHAGE) 500 MG tablet TAKE ONE TABLET BY MOUTH 2 TIMES A DAY WITH A MEAL. 180 tablet 3  . mycophenolate (CELLCEPT) 500 MG tablet Take 500-1,000 mg by mouth 2 (two) times daily. Patient takes 2 tablets (1037m) in the morning and 1 tablet(50365m at night    . Probiotic Product (PROBIOTIC DAILY PO) Take by mouth daily.    . ramipril (ALTACE) 2.5 MG capsule Take 2.5 mg by mouth daily.    . sildenafil (REVATIO) 20 MG tablet TAKE 2 AND 1/2 TABLETS BY MOUTH DAILY AS NEEDED. 25 tablet 2  . tacrolimus (PROGRAF) 1 MG capsule Take 0.5 mg by mouth. Takes 0.65m38mwice daily.    . Vilazodone HCl (VIIBRYD) 10 MG TABS Take 1 tablet (10 mg total) by mouth daily. 30 tablet 2  . amLODipine (NORVASC) 2.5 MG tablet Take 1 tablet (2.5  mg total) by mouth daily. (Patient not taking: Reported on 05/16/2019) 90 tablet 3  . liraglutide (VICTOZA) 18 MG/3ML SOPN Inject 0.1 mLs (0.6 mg total) into the skin daily for 7 days, THEN 0.2 mLs (1.2 mg total) daily for 7 days, THEN 0.3 mLs (1.8 mg total) daily. (Patient not taking: Reported on 05/16/2019) 11.1 mL 0  . magnesium 30 MG tablet Take 30 mg by mouth 2 (two) times daily.     No current facility-administered medications for this visit.     Patient confirms/reports the following allergies:  Allergies  Allergen Reactions  . Lactose Intolerance (Gi)     bloating  . Tramadol     nausea    No orders of the defined types were placed in this encounter.   AUTHORIZATION  INFORMATION Primary Insurance: Medicaid [place],  ID #: [id number] Pre-Cert / Josem Kaufmann required: Not required   SCHEDULE INFORMATION: Procedure has been scheduled as follows:  Date:08/16/2019 , Time: 10:30 Location: [facility] with Dr. Oneida Alar  This Gastroenterology Pre-Precedure Review Form is being routed to the following provider(s): Walden Field, NP

## 2019-05-18 ENCOUNTER — Telehealth: Payer: Self-pay | Admitting: *Deleted

## 2019-05-18 NOTE — Telephone Encounter (Signed)
Called pt to schedule TCS w/ MAC in Oct.  Had to leave voice mail for pt to call me back.

## 2019-05-19 ENCOUNTER — Encounter: Payer: Self-pay | Admitting: *Deleted

## 2019-05-19 MED ORDER — NA SULFATE-K SULFATE-MG SULF 17.5-3.13-1.6 GM/177ML PO SOLN: 1.0000 | Freq: Once | ORAL | 0 refills | Status: AC

## 2019-05-19 NOTE — Progress Notes (Signed)
Mailing out letter with diabetes medication adjustments.

## 2019-05-19 NOTE — Telephone Encounter (Signed)
Tried to call pt again to set up TCS w/ MAC.  LMOM.

## 2019-05-19 NOTE — Telephone Encounter (Signed)
Pt called back and is scheduled for 08/16/2019.  Pt is aware that we are mailing out all information discussed by phone that included prep instructions and pre-procedure acknowledgments.  We will also include diabetes medication adjustments and pre-op appointment info.  Pt is aware and voiced understanding.

## 2019-05-19 NOTE — Progress Notes (Signed)
Noted.  DM meds:Metformin: none morning of.  On prep day: Check CBG ac and hs (as able) as well as if the patient feels like their blood sugar is off. Can use soda, juice (that's in [place]) as needed for any low blood sugar.  Check CBG on arrival to endo unit.

## 2019-05-19 NOTE — Addendum Note (Signed)
Addended by: Metro Kung on: 05/19/2019 02:18 PM   Modules accepted: Orders, SmartSet

## 2019-05-19 NOTE — Patient Instructions (Signed)
Anthony Price  [birth year] MRN: [id number]     Procedure Date: 08/16/2019 Time to register: 9:30 am Place to register: [facility] Procedure Time: 10:30 am Scheduled provider: Dr. Oneida Alar    PREPARATION FOR COLONOSCOPY WITH SUPREP BOWEL PREP KIT  Note: Suprep Bowel Prep Kit is a split-dose (2day) regimen. Consumption of BOTH 6-ounce bottles is required for a complete prep.  Please notify us immediately if you are diabetic, take iron supplements, or if you are on Coumadin or any other blood thinners.  Please hold the following medications: See letter                                                                                                                                                  1 DAY BEFORE PROCEDURE:  DATE: 08/15/2019  DAY: Monday Continue clear liquids the entire day - NO SOLID FOOD.   Diabetic medications adjustments for today: See letter  At 6:00pm: Complete steps 1 through 4 below, using ONE (1) 6-ounce bottle, before going to bed. Step 1:  Pour ONE (1) 6-ounce bottle of SUPREP liquid into the mixing container.  Step 2:  Add cool drinking water to the 16 ounce line on the container and mix.  Note: Dilute the solution concentrate as directed prior to use. Step 3:  DRINK ALL the liquid in the container. Step 4:  You MUST drink an additional two (2) or more 16 ounce containers of water over the next one (1) hour.   Continue clear liquids.  DAY OF PROCEDURE:   DATE: 08/16/2019 DAY: Tuesday If you take medications for your heart, blood pressure, or breathing, you may take these medications.  Diabetic medications adjustments for today: See letter  5 hours before your procedure at :  5:30 am Step 1:  Pour ONE (1) 6-ounce bottle of SUPREP liquid into the mixing container.  Step 2:  Add cool drinking water to the 16 ounce line on the container and mix.  Note: Dilute the solution concentrate as directed prior to use. Step 3:  DRINK ALL the liquid in  the container. Step 4:  You MUST drink an additional two (2) or more 16 ounce containers of water over the next one (1) hour. You MUST complete the final glass of water at least 3 hours before your colonoscopy. Nothing by mouth past 7:30 am  You may take your morning medications with sip of water unless we have instructed otherwise.    Please see below for Dietary Information.  CLEAR LIQUIDS INCLUDE:  Water Jello (NOT red in color)   Ice Popsicles (NOT red in color)   Tea (sugar ok, no milk/cream) Powdered fruit flavored drinks  Coffee (sugar ok, no milk/cream) Gatorade/ Lemonade/ Kool-Aid  (NOT red in color)   Juice: apple, white grape, white cranberry Soft drinks  Clear bullion, consomme, broth (  fat free beef/chicken/vegetable)  Carbonated beverages (any kind)  Strained chicken noodle soup Hard Candy   Remember: Clear liquids are liquids that will allow you to see your fingers on the other side of a clear glass. Be sure liquids are NOT red in color, and not cloudy, but CLEAR.  DO NOT EAT OR DRINK ANY OF THE FOLLOWING:  Dairy products of any kind   Cranberry juice Tomato juice / V8 juice   Grapefruit juice Orange juice     Red grape juice  Do not eat any solid foods, including such foods as: cereal, oatmeal, yogurt, fruits, vegetables, creamed soups, eggs, bread, crackers, pureed foods in a blender, etc.   HELPFUL HINTS FOR DRINKING PREP SOLUTION:   Make sure prep is extremely cold. Mix and refrigerate the the morning of the prep. You may also put in the freezer.   You may try mixing some Crystal Light or Country Time Lemonade if you prefer. Mix in small amounts; add more if necessary.  Try drinking through a straw  Rinse mouth with water or a mouthwash between glasses, to remove after-taste.  Try sipping on a cold beverage /ice/ popsicles between glasses of prep.  Place a piece of sugar-free hard candy in mouth between glasses.  If you become nauseated, try consuming  smaller amounts, or stretch out the time between glasses. Stop for 30-60 minutes, then slowly start back drinking.     OTHER INSTRUCTIONS  You will need a responsible adult at least 61 years of age to accompany you and drive you home. This person must remain in the waiting room during your procedure. The hospital will cancel your procedure if you do not have a responsible adult with you.   1. Wear loose fitting clothing that is easily removed. 2. Leave jewelry and other valuables at home.  3. Remove all body piercing jewelry and leave at home. 4. Total time from sign-in until discharge is approximately 2-3 hours. 5. You should go home directly after your procedure and rest. You can resume normal activities the day after your procedure. 6. The day of your procedure you should not:  Drive  Make legal decisions  Operate machinery  Drink alcohol  Return to work   You may call the office (Dept: [phone]) before 5:00pm, or page the doctor on call [phone]) after 5:00pm, for further instructions, if necessary.   Insurance Information YOU WILL NEED TO CHECK WITH YOUR INSURANCE COMPANY FOR THE BENEFITS OF COVERAGE YOU HAVE FOR THIS PROCEDURE.  UNFORTUNATELY, NOT ALL INSURANCE COMPANIES HAVE BENEFITS TO COVER ALL OR PART OF THESE TYPES OF PROCEDURES.  IT IS YOUR RESPONSIBILITY TO CHECK YOUR BENEFITS, HOWEVER, WE WILL BE GLAD TO ASSIST YOU WITH ANY CODES YOUR INSURANCE COMPANY MAY NEED.    PLEASE NOTE THAT MOST INSURANCE COMPANIES WILL NOT COVER A SCREENING COLONOSCOPY FOR PEOPLE UNDER THE AGE OF 50  IF YOU HAVE BCBS INSURANCE, YOU MAY HAVE BENEFITS FOR A SCREENING COLONOSCOPY BUT IF POLYPS ARE FOUND THE DIAGNOSIS WILL CHANGE AND THEN YOU MAY HAVE A DEDUCTIBLE THAT WILL NEED TO BE MET. SO PLEASE MAKE SURE YOU CHECK YOUR BENEFITS FOR A SCREENING COLONOSCOPY AS WELL AS A DIAGNOSTIC COLONOSCOPY.

## 2019-05-19 NOTE — Addendum Note (Signed)
Addended by: Metro Kung on: 05/19/2019 09:04 AM   Modules accepted: Orders

## 2019-05-23 ENCOUNTER — Encounter: Payer: Self-pay | Admitting: *Deleted

## 2019-05-24 DIAGNOSIS — N183 Chronic kidney disease, stage 3 (moderate): Secondary | ICD-10-CM | POA: Diagnosis not present

## 2019-05-24 DIAGNOSIS — Z941 Heart transplant status: Secondary | ICD-10-CM | POA: Diagnosis not present

## 2019-05-24 DIAGNOSIS — R0989 Other specified symptoms and signs involving the circulatory and respiratory systems: Secondary | ICD-10-CM | POA: Diagnosis not present

## 2019-05-24 DIAGNOSIS — I10 Essential (primary) hypertension: Secondary | ICD-10-CM | POA: Diagnosis not present

## 2019-06-01 ENCOUNTER — Other Ambulatory Visit: Payer: Self-pay | Admitting: Cardiovascular Disease

## 2019-06-02 ENCOUNTER — Other Ambulatory Visit (HOSPITAL_COMMUNITY): Payer: Self-pay | Admitting: Nephrology

## 2019-06-02 ENCOUNTER — Other Ambulatory Visit: Payer: Self-pay | Admitting: Nephrology

## 2019-06-02 DIAGNOSIS — R0989 Other specified symptoms and signs involving the circulatory and respiratory systems: Secondary | ICD-10-CM

## 2019-06-08 ENCOUNTER — Other Ambulatory Visit: Payer: Self-pay | Admitting: Cardiovascular Disease

## 2019-06-08 ENCOUNTER — Other Ambulatory Visit (HOSPITAL_COMMUNITY): Payer: Self-pay | Admitting: Cardiovascular Disease

## 2019-06-08 DIAGNOSIS — R0989 Other specified symptoms and signs involving the circulatory and respiratory systems: Secondary | ICD-10-CM

## 2019-06-16 ENCOUNTER — Other Ambulatory Visit: Payer: Self-pay

## 2019-06-16 ENCOUNTER — Ambulatory Visit (HOSPITAL_COMMUNITY)
Admission: RE | Admit: 2019-06-16 | Discharge: 2019-06-16 | Disposition: A | Discharge status: Home / Self Care | Payer: Medicaid Other | Source: Ambulatory Visit | Attending: Cardiovascular Disease | Admitting: Cardiovascular Disease

## 2019-06-16 DIAGNOSIS — R0989 Other specified symptoms and signs involving the circulatory and respiratory systems: Secondary | ICD-10-CM | POA: Diagnosis present

## 2019-07-28 ENCOUNTER — Other Ambulatory Visit (HOSPITAL_COMMUNITY): Payer: Self-pay | Admitting: Psychiatry

## 2019-07-28 ENCOUNTER — Other Ambulatory Visit: Payer: Self-pay | Admitting: Family Medicine

## 2019-07-28 DIAGNOSIS — I10 Essential (primary) hypertension: Secondary | ICD-10-CM

## 2019-07-28 DIAGNOSIS — F321 Major depressive disorder, single episode, moderate: Secondary | ICD-10-CM

## 2019-08-10 NOTE — Patient Instructions (Signed)
JUVENS MATTON  08/10/2019     @PREFPERIOPPHARMACY @   Your procedure is scheduled on  08/16/2019 .  Report to [facility] at  [facility]  Call this number if you have problems the morning of surgery:  [phone]   Remember:   Follow the diet and prep instructions given to you by Dr Nona Dell office.                   Take these medicines the morning of surgery with A SIP OF WATER  Xanax(if needed), wellbutrin, carvedilol, nexium, cellcept, revatio, prograf. DO NOT take any medications for diabetes the morning of your procedure.    Do not wear jewelry, make-up or nail polish.  Do not wear lotions, powders, or perfumes. Please wear deodorant and brush your teeth.  Do not shave 48 hours prior to surgery.  Men may shave face and neck.  Do not bring valuables to the hospital.  [facility] is not responsible for any belongings or valuables.  Contacts, dentures or bridgework may not be worn into surgery.  Leave your suitcase in the car.  After surgery it may be brought to your room.  For patients admitted to the hospital, discharge time will be determined by your treatment team.  Patients discharged the day of surgery will not be allowed to drive home.   Name and phone number of your driver:   family Special instructions:  None  Please read over the following fact sheets that you were given. Anesthesia Post-op Instructions and Care and Recovery After Surgery       Colonoscopy, Adult, Care After This sheet gives you information about how to care for yourself after your procedure. Your health care provider may also give you more specific instructions. If you have problems or questions, contact your health care provider. What can I expect after the procedure? After the procedure, it is common to have:  A small amount of blood in your stool for 24 hours after the procedure.  Some gas.  Mild abdominal cramping or bloating. Follow these instructions at home: General  instructions  For the first 24 hours after the procedure: ? Do not drive or use machinery. ? Do not sign important documents. ? Do not drink alcohol. ? Do your regular daily activities at a slower pace than normal. ? Eat soft, easy-to-digest foods.  Take over-the-counter or prescription medicines only as told by your health care provider. Relieving cramping and bloating   Try walking around when you have cramps or feel bloated.  Apply heat to your abdomen as told by your health care provider. Use a heat source that your health care provider recommends, such as a moist heat pack or a heating pad. ? Place a towel between your skin and the heat source. ? Leave the heat on for 20-30 minutes. ? Remove the heat if your skin turns bright red. This is especially important if you are unable to feel pain, heat, or cold. You may have a greater risk of getting burned. Eating and drinking   Drink enough fluid to keep your urine pale yellow.  Resume your normal diet as instructed by your health care provider. Avoid heavy or fried foods that are hard to digest.  Avoid drinking alcohol for as long as instructed by your health care provider. Contact a health care provider if:  You have blood in your stool 2-3 days after the procedure. Get help right away if:  You have more than a small spotting of blood in your stool.  You pass large blood clots in your stool.  Your abdomen is swollen.  You have nausea or vomiting.  You have a fever.  You have increasing abdominal pain that is not relieved with medicine. Summary  After the procedure, it is common to have a small amount of blood in your stool. You may also have mild abdominal cramping and bloating.  For the first 24 hours after the procedure, do not drive or use machinery, sign important documents, or drink alcohol.  Contact your health care provider if you have a lot of blood in your stool, nausea or vomiting, a fever, or increased  abdominal pain. This information is not intended to replace advice given to you by your health care provider. Make sure you discuss any questions you have with your health care provider. Document Released: 05/27/2004 Document Revised: 08/05/2017 Document Reviewed: 12/25/2015 Elsevier Patient Education  2020 [employer] After These instructions provide you with information about caring for yourself after your procedure. Your health care provider may also give you more specific instructions. Your treatment has been planned according to current medical practices, but problems sometimes occur. Call your health care provider if you have any problems or questions after your procedure. What can I expect after the procedure? After your procedure, you may:  Feel sleepy for several hours.  Feel clumsy and have poor balance for several hours.  Feel forgetful about what happened after the procedure.  Have poor judgment for several hours.  Feel nauseous or vomit.  Have a sore throat if you had a breathing tube during the procedure. Follow these instructions at home: For at least 24 hours after the procedure:      Have a responsible adult stay with you. It is important to have someone help care for you until you are awake and alert.  Rest as needed.  Do not: ? Participate in activities in which you could fall or become injured. ? Drive. ? Use heavy machinery. ? Drink alcohol. ? Take sleeping pills or medicines that cause drowsiness. ? Make important decisions or sign legal documents. ? Take care of children on your own. Eating and drinking  Follow the diet that is recommended by your health care provider.  If you vomit, drink water, juice, or soup when you can drink without vomiting.  Make sure you have little or no nausea before eating solid foods. General instructions  Take over-the-counter and prescription medicines only as told by your health  care provider.  If you have sleep apnea, surgery and certain medicines can increase your risk for breathing problems. Follow instructions from your health care provider about wearing your sleep device: ? Anytime you are sleeping, including during daytime naps. ? While taking prescription pain medicines, sleeping medicines, or medicines that make you drowsy.  If you smoke, do not smoke without supervision.  Keep all follow-up visits as told by your health care provider. This is important. Contact a health care provider if:  You keep feeling nauseous or you keep vomiting.  You feel light-headed.  You develop a rash.  You have a fever. Get help right away if:  You have trouble breathing. Summary  For several hours after your procedure, you may feel sleepy and have poor judgment.  Have a responsible adult stay with you for at least 24 hours or until you are awake and alert. This information is not intended to  replace advice given to you by your health care provider. Make sure you discuss any questions you have with your health care provider. Document Released: 02/03/2016 Document Revised: 01/11/2018 Document Reviewed: 02/03/2016 Elsevier Patient Education  2020 [employer].

## 2019-08-12 ENCOUNTER — Encounter (HOSPITAL_COMMUNITY)
Admission: RE | Admit: 2019-08-12 | Discharge: 2019-08-12 | Disposition: A | Discharge status: Home / Self Care | Payer: Medicaid Other | Source: Ambulatory Visit | Attending: Gastroenterology | Admitting: Gastroenterology

## 2019-08-12 ENCOUNTER — Other Ambulatory Visit (HOSPITAL_COMMUNITY)
Admission: RE | Admit: 2019-08-12 | Discharge: 2019-08-12 | Disposition: A | Discharge status: Home / Self Care | Payer: Medicaid Other | Source: Ambulatory Visit | Attending: Gastroenterology | Admitting: Gastroenterology

## 2019-08-12 ENCOUNTER — Other Ambulatory Visit: Payer: Self-pay

## 2019-08-12 ENCOUNTER — Encounter (HOSPITAL_COMMUNITY): Payer: Self-pay

## 2019-08-12 DIAGNOSIS — Z01818 Encounter for other preprocedural examination: Secondary | ICD-10-CM | POA: Diagnosis not present

## 2019-08-12 DIAGNOSIS — Z20828 Contact with and (suspected) exposure to other viral communicable diseases: Secondary | ICD-10-CM | POA: Diagnosis not present

## 2019-08-12 LAB — BASIC METABOLIC PANEL
Anion gap: 9 (ref 5–15)
BUN: 18 mg/dL (ref 8–23)
CO2: 24 mmol/L (ref 22–32)
Calcium: 9.2 mg/dL (ref 8.9–10.3)
Chloride: 102 mmol/L (ref 98–111)
Creatinine, Ser: 1.55 mg/dL — ABNORMAL HIGH (ref 0.61–1.24)
GFR calc Af Amer: 55 mL/min — ABNORMAL LOW (ref >60)
GFR calc non Af Amer: 48 mL/min — ABNORMAL LOW (ref >60)
Glucose, Bld: 130 mg/dL — ABNORMAL HIGH (ref 70–99)
Potassium: 5.1 mmol/L (ref 3.5–5.1)
Sodium: 135 mmol/L (ref 135–145)

## 2019-08-12 LAB — SARS CORONAVIRUS 2 (TAT 6-24 HRS): SARS Coronavirus 2: NEGATIVE

## 2019-08-15 NOTE — Progress Notes (Signed)
CC'D TO PCP °

## 2019-08-16 ENCOUNTER — Ambulatory Visit (HOSPITAL_COMMUNITY)
Admission: RE | Admit: 2019-08-16 | Discharge: 2019-08-16 | Disposition: A | Discharge status: Home / Self Care | Payer: Medicaid Other | Attending: Gastroenterology | Admitting: Gastroenterology

## 2019-08-16 ENCOUNTER — Ambulatory Visit (HOSPITAL_COMMUNITY): Payer: Medicaid Other | Admitting: Anesthesiology

## 2019-08-16 ENCOUNTER — Encounter (HOSPITAL_COMMUNITY)
Admission: RE | Disposition: A | Discharge status: Home / Self Care | Payer: Self-pay | Source: Home / Self Care | Attending: Gastroenterology

## 2019-08-16 ENCOUNTER — Other Ambulatory Visit: Payer: Self-pay

## 2019-08-16 ENCOUNTER — Encounter (HOSPITAL_COMMUNITY): Payer: Self-pay

## 2019-08-16 DIAGNOSIS — D126 Benign neoplasm of colon, unspecified: Secondary | ICD-10-CM | POA: Diagnosis not present

## 2019-08-16 DIAGNOSIS — Z7982 Long term (current) use of aspirin: Secondary | ICD-10-CM | POA: Insufficient documentation

## 2019-08-16 DIAGNOSIS — Z794 Long term (current) use of insulin: Secondary | ICD-10-CM | POA: Insufficient documentation

## 2019-08-16 DIAGNOSIS — K648 Other hemorrhoids: Secondary | ICD-10-CM | POA: Insufficient documentation

## 2019-08-16 DIAGNOSIS — Z791 Long term (current) use of non-steroidal anti-inflammatories (NSAID): Secondary | ICD-10-CM | POA: Diagnosis not present

## 2019-08-16 DIAGNOSIS — Z87891 Personal history of nicotine dependence: Secondary | ICD-10-CM | POA: Diagnosis not present

## 2019-08-16 DIAGNOSIS — Z860100 Personal history of colon polyps, unspecified: Secondary | ICD-10-CM

## 2019-08-16 DIAGNOSIS — I110 Hypertensive heart disease with heart failure: Secondary | ICD-10-CM | POA: Diagnosis not present

## 2019-08-16 DIAGNOSIS — I509 Heart failure, unspecified: Secondary | ICD-10-CM | POA: Insufficient documentation

## 2019-08-16 DIAGNOSIS — F329 Major depressive disorder, single episode, unspecified: Secondary | ICD-10-CM | POA: Diagnosis not present

## 2019-08-16 DIAGNOSIS — K635 Polyp of colon: Secondary | ICD-10-CM | POA: Diagnosis not present

## 2019-08-16 DIAGNOSIS — F419 Anxiety disorder, unspecified: Secondary | ICD-10-CM | POA: Insufficient documentation

## 2019-08-16 DIAGNOSIS — K219 Gastro-esophageal reflux disease without esophagitis: Secondary | ICD-10-CM | POA: Diagnosis not present

## 2019-08-16 DIAGNOSIS — Z941 Heart transplant status: Secondary | ICD-10-CM | POA: Diagnosis not present

## 2019-08-16 DIAGNOSIS — K589 Irritable bowel syndrome without diarrhea: Secondary | ICD-10-CM | POA: Insufficient documentation

## 2019-08-16 DIAGNOSIS — Q438 Other specified congenital malformations of intestine: Secondary | ICD-10-CM | POA: Diagnosis not present

## 2019-08-16 DIAGNOSIS — K644 Residual hemorrhoidal skin tags: Secondary | ICD-10-CM | POA: Insufficient documentation

## 2019-08-16 DIAGNOSIS — E7800 Pure hypercholesterolemia, unspecified: Secondary | ICD-10-CM | POA: Diagnosis not present

## 2019-08-16 DIAGNOSIS — Z79899 Other long term (current) drug therapy: Secondary | ICD-10-CM | POA: Diagnosis not present

## 2019-08-16 DIAGNOSIS — Z1211 Encounter for screening for malignant neoplasm of colon: Secondary | ICD-10-CM | POA: Insufficient documentation

## 2019-08-16 DIAGNOSIS — Z09 Encounter for follow-up examination after completed treatment for conditions other than malignant neoplasm: Secondary | ICD-10-CM | POA: Diagnosis not present

## 2019-08-16 DIAGNOSIS — I252 Old myocardial infarction: Secondary | ICD-10-CM | POA: Diagnosis not present

## 2019-08-16 HISTORY — PX: COLONOSCOPY WITH PROPOFOL: SHX5780

## 2019-08-16 LAB — GLUCOSE, CAPILLARY
Glucose-Capillary: 129 mg/dL — ABNORMAL HIGH (ref 70–99)
Glucose-Capillary: 123 mg/dL — ABNORMAL HIGH (ref 70–99)

## 2019-08-16 SURGERY — COLONOSCOPY WITH PROPOFOL
Anesthesia: General

## 2019-08-16 MED ORDER — PROPOFOL 10 MG/ML IV BOLUS
INTRAVENOUS | Status: DC | PRN
  Administered 2019-08-16: 20 mg via INTRAVENOUS

## 2019-08-16 MED ORDER — PROPOFOL 500 MG/50ML IV EMUL
INTRAVENOUS | Status: DC | PRN
  Administered 2019-08-16: 150 ug/kg/min via INTRAVENOUS

## 2019-08-16 MED ORDER — LIDOCAINE 2% (20 MG/ML) 5 ML SYRINGE
INTRAMUSCULAR | Status: AC
  Filled 2019-08-16: qty 10

## 2019-08-16 MED ORDER — KETAMINE HCL 50 MG/5ML IJ SOSY
PREFILLED_SYRINGE | INTRAMUSCULAR | Status: AC
  Filled 2019-08-16: qty 5

## 2019-08-16 MED ORDER — CHLORHEXIDINE GLUCONATE CLOTH 2 % EX PADS: 6.0000 | MEDICATED_PAD | Freq: Once | CUTANEOUS | Status: DC

## 2019-08-16 MED ORDER — KETAMINE HCL 10 MG/ML IJ SOLN
INTRAMUSCULAR | Status: DC | PRN
  Administered 2019-08-16: 20 mg via INTRAVENOUS

## 2019-08-16 MED ORDER — PROPOFOL 10 MG/ML IV BOLUS
INTRAVENOUS | Status: AC
  Filled 2019-08-16: qty 40

## 2019-08-16 MED ORDER — MIDAZOLAM HCL 2 MG/2ML IJ SOLN
INTRAMUSCULAR | Status: AC
  Filled 2019-08-16: qty 2

## 2019-08-16 MED ORDER — PROMETHAZINE HCL 25 MG/ML IJ SOLN: 6.2500 mg | INTRAMUSCULAR | Status: DC | PRN

## 2019-08-16 MED ORDER — LIDOCAINE HCL (CARDIAC) PF 100 MG/5ML IV SOSY
PREFILLED_SYRINGE | INTRAVENOUS | Status: DC | PRN
  Administered 2019-08-16: 40 mg via INTRAVENOUS

## 2019-08-16 MED ORDER — LACTATED RINGERS IV SOLN
INTRAVENOUS | Status: DC
  Administered 2019-08-16: 10:00:00 via INTRAVENOUS

## 2019-08-16 MED ORDER — MIDAZOLAM HCL 2 MG/2ML IJ SOLN: 0.5000 mg | Freq: Once | INTRAMUSCULAR | Status: DC | PRN

## 2019-08-16 NOTE — Anesthesia Procedure Notes (Signed)
Procedure Name: General with mask airway Date/Time: 08/16/2019 10:15 AM Performed by: Andree Elk Amy A, CRNA Pre-anesthesia Checklist: Patient identified, Emergency Drugs available, Suction available, Timeout performed and Patient being monitored Patient Re-evaluated:Patient Re-evaluated prior to induction Oxygen Delivery Method: Non-rebreather mask

## 2019-08-16 NOTE — Anesthesia Postprocedure Evaluation (Signed)
Anesthesia Post Note  Patient: Anthony Price  Procedure(s) Performed: COLONOSCOPY WITH PROPOFOL (N/A )  Patient location during evaluation: PACU Anesthesia Type: General Level of consciousness: awake and alert, oriented and patient cooperative Pain management: pain level not controlled Vital Signs Assessment: post-procedure vital signs reviewed and stable Respiratory status: spontaneous breathing Cardiovascular status: stable Postop Assessment: no apparent nausea or vomiting Anesthetic complications: no     Last Vitals:  Vitals:   08/16/19 0855  BP: (!) 137/92  Pulse: 93  Resp: 18  Temp: 36.6 C  SpO2: 100%    Last Pain:  Vitals:   08/16/19 0855  TempSrc: Oral  PainSc: 0-No pain                 ADAMS, AMY A
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
## 2019-08-16 NOTE — Transfer of Care (Signed)
Immediate Anesthesia Transfer of Care Note  Patient: Anthony Price  Procedure(s) Performed: COLONOSCOPY WITH PROPOFOL (N/A )  Patient Location: PACU  Anesthesia Type:General  Level of Consciousness: awake, alert , oriented and patient cooperative  Airway & Oxygen Therapy: Patient Spontanous Breathing  Post-op Assessment: Report given to RN and Post -op Vital signs reviewed and stable  Post vital signs: Reviewed and stable  Last Vitals:  Vitals Value Taken Time  BP    Temp    Pulse 85 08/16/19 1050  Resp 18 08/16/19 1050  SpO2 100 % 08/16/19 1050  Vitals shown include unvalidated device data.  Last Pain:  Vitals:   08/16/19 0855  TempSrc: Oral  PainSc: 0-No pain         Complications: No apparent anesthesia complications

## 2019-08-16 NOTE — Discharge Instructions (Signed)
You had 3 small polyps removed. You have MODERATE internal AND EXTERNAL hemorrhoids.   DRINK WATER TO KEEP YOUR URINE LIGHT YELLOW.  FOLLOW A HIGH FIBER DIET. AVOID ITEMS THAT CAUSE BLOATING & GAS. SEE INFO BELOW.   USE PREPARATION H FOUR TIMES  A DAY IF NEEDED TO RELIEVE RECTAL PAIN/PRESSURE/BLEEDING.   YOUR BIOPSY RESULTS WILL BE BACK IN 5 BUSINESS DAYS.  Next colonoscopy in 3 years.    Colonoscopy Care After Read the instructions outlined below and refer to this sheet in the next week. These discharge instructions provide you with general information on caring for yourself after you leave the hospital. While your treatment has been planned according to the most current medical practices available, unavoidable complications occasionally occur. If you have any problems or questions after discharge, call DR. Carnisha Feltz, [phone].  ACTIVITY  You may resume your regular activity, but move at a slower pace for the next 24 hours.   Take frequent rest periods for the next 24 hours.   Walking will help get rid of the air and reduce the bloated feeling in your belly (abdomen).   No driving for 24 hours (because of the medicine (anesthesia) used during the test).   You may shower.   Do not sign any important legal documents or operate any machinery for 24 hours (because of the anesthesia used during the test).    NUTRITION  Drink plenty of fluids.   You may resume your normal diet as instructed by your doctor.   Begin with a light meal and progress to your normal diet. Heavy or fried foods are harder to digest and may make you feel sick to your stomach (nauseated).   Avoid alcoholic beverages for 24 hours or as instructed.    MEDICATIONS  You may resume your normal medications.   WHAT YOU CAN EXPECT TODAY  Some feelings of bloating in the abdomen.   Passage of more gas than usual.   Spotting of blood in your stool or on the toilet paper  .  IF YOU HAD POLYPS  REMOVED DURING THE COLONOSCOPY:  Eat a soft diet IF YOU HAVE NAUSEA, BLOATING, ABDOMINAL PAIN, OR VOMITING.    FINDING OUT THE RESULTS OF YOUR TEST Not all test results are available during your visit. DR. Oneida Alar WILL CALL YOU WITHIN 14 DAYS OF YOUR PROCEDUE WITH YOUR RESULTS. Do not assume everything is normal if you have not heard from DR. Emillie Chasen, CALL HER OFFICE AT [phone].  SEEK IMMEDIATE MEDICAL ATTENTION AND CALL THE OFFICE: [phone] IF:  You have more than a spotting of blood in your stool.   Your belly is swollen (abdominal distention).   You are nauseated or vomiting.   You have a temperature over 101F.   You have abdominal pain or discomfort that is severe or gets worse throughout the day.   High-Fiber Diet A high-fiber diet changes your normal diet to include more whole grains, legumes, fruits, and vegetables. Changes in the diet involve replacing refined carbohydrates with unrefined foods. The calorie level of the diet is essentially unchanged. The Dietary Reference Intake (recommended amount) for adult males is 38 grams per day. For adult females, it is 25 grams per day. Pregnant and lactating women should consume 28 grams of fiber per day. Fiber is the intact part of a plant that is not broken down during digestion. Functional fiber is fiber that has been isolated from the plant to provide a beneficial effect in the body.  PURPOSE  Increase stool bulk.   Ease and regulate bowel movements.   Lower cholesterol.   REDUCE RISK OF COLON CANCER  INDICATIONS THAT YOU NEED MORE FIBER  Constipation and hemorrhoids.   Uncomplicated diverticulosis (intestine condition) and irritable bowel syndrome.   Weight management.   As a protective measure against hardening of the arteries (atherosclerosis), diabetes, and cancer.   GUIDELINES FOR INCREASING FIBER IN THE DIET  Start adding fiber to the diet slowly. A gradual increase of about 5 more grams (2 servings of  most fruits or vegetables) per day is best. Too rapid an increase in fiber may result in constipation, flatulence, and bloating.   Drink enough water and fluids to keep your urine clear or pale yellow. Water, juice, or caffeine-free drinks are recommended. Not drinking enough fluid may cause constipation.   Eat a variety of high-fiber foods rather than one type of fiber.   Try to increase your intake of fiber through using high-fiber foods rather than fiber pills or supplements that contain small amounts of fiber.   The goal is to change the types of food eaten. Do not supplement your present diet with high-fiber foods, but replace foods in your present diet.    Polyps, Colon  A polyp is extra tissue that grows inside your body. Colon polyps grow in the large intestine. The large intestine, also called the colon, is part of your digestive system. It is a long, hollow tube at the end of your digestive tract where your body makes and stores stool. Most polyps are not dangerous. They are benign. This means they are not cancerous. But over time, some types of polyps can turn into cancer. Polyps that are smaller than a pea are usually not harmful. But larger polyps could someday become or may already be cancerous. To be safe, doctors remove all polyps and test them.   WHO GETS POLYPS? Anyone can get polyps, but certain people are more likely than others. You may have a greater chance of getting polyps if:  You are over 50.   You have had polyps before.   Someone in your family has had polyps.   Someone in your family has had cancer of the large intestine.   Find out if someone in your family has had polyps. You may also be more likely to get polyps if you:   Eat a lot of fatty foods   Smoke   Drink alcohol   Do not exercise  Eat too much   PREVENTION There is not one sure way to prevent polyps. You might be able to lower your risk of getting them if you:  Eat more fruits and vegetables  and less fatty food.   Do not smoke.   Avoid alcohol.   Exercise every day.   Lose weight if you are overweight.   Eating more calcium and folate can also lower your risk of getting polyps. Some foods that are rich in calcium are milk, cheese, and broccoli. Some foods that are rich in folate are chickpeas, kidney beans, and spinach.

## 2019-08-16 NOTE — Op Note (Signed)
[facility] Patient Name: Anthony Price Procedure Date: 08/16/2019 10:10 AM MRN: [id number] Date of Birth: [birth year] Attending MD: Barney Drain MD, MD CSN: [id number] Age: 63 Admit Type: Outpatient Procedure:                Colonoscopy WITH COLD SNARE POLYPECTOMY Indications:              Personal history of colonic polyps Providers:                Barney Drain MD, MD, Hinton Rao, RN, Nelma Rothman,                            Technician Referring MD:             Fransisca Kaufmann. Dettinger Medicines:                Propofol per Anesthesia Complications:            No immediate complications. Estimated Blood Loss:     Estimated blood loss was minimal. Procedure:                Pre-Anesthesia Assessment:                           - Prior to the procedure, a History and Physical                            was performed, and patient medications and                            allergies were reviewed. The patient's tolerance of                            previous anesthesia was also reviewed. The risks                            and benefits of the procedure and the sedation                            options and risks were discussed with the patient.                            All questions were answered, and informed consent                            was obtained. Prior Anticoagulants: The patient has                            taken no previous anticoagulant or antiplatelet                            agents except for aspirin. ASA Grade Assessment:                            III - A patient with severe systemic disease. After  reviewing the risks and benefits, the patient was                            deemed in satisfactory condition to undergo the                            procedure. After obtaining informed consent, the                            colonoscope was passed under direct vision.                            Throughout the procedure, the patient's  blood                            pressure, pulse, and oxygen saturations were                            monitored continuously. The [id number]                            scope was introduced through the anus and advanced                            to the the cecum, identified by appendiceal orifice                            and ileocecal valve. The colonoscopy was somewhat                            difficult due to a tortuous colon. Successful                            completion of the procedure was aided by                            straightening and shortening the scope to obtain                            bowel loop reduction and COLOWRAP. The patient                            tolerated the procedure well. The quality of the                            bowel preparation was excellent. The ileocecal                            valve, appendiceal orifice, and rectum were                            photographed. Scope In: 10:24:54 AM Scope Out: 10:43:02 AM Scope Withdrawal Time: 0 hours 15 minutes 58 seconds  Total Procedure Duration: 0 hours 18  minutes 8 seconds  Findings:      Three sessile polyps were found in the splenic flexure and ascending       colon. The polyps were 3 to 5 mm in size. These polyps were removed with       a cold snare. Resection and retrieval were complete.      External and internal hemorrhoids were found. The hemorrhoids were       moderate.      There was mild spasm in the recto-sigmoid colon, in the sigmoid colon       and in the descending colon. Impression:               - Three 3 to 5 mm polyps at the splenic flexure and                            in the ascending colon, removed with a cold snare.                            Resected and retrieved.                           - External and internal hemorrhoids.                           - Mild colonic spasm. Moderate Sedation:      Per Anesthesia Care Recommendation:           - Patient has  a contact number available for                            emergencies. The signs and symptoms of potential                            delayed complications were discussed with the                            patient. Return to normal activities tomorrow.                            Written discharge instructions were provided to the                            patient.                           - High fiber diet.                           - Continue present medications.                           - Await pathology results.                           - Repeat colonoscopy in 3 years for surveillance. Procedure Code(s):        --- Professional ---  45385, Colonoscopy, flexible; with removal of                            tumor(s), polyp(s), or other lesion(s) by snare                            technique Diagnosis Code(s):        --- Professional ---                           K63.5, Polyp of colon                           K64.8, Other hemorrhoids                           K58.9, Irritable bowel syndrome without diarrhea                           Z86.010, Personal history of colonic polyps CPT copyright 2019 [employer]. All rights reserved. The codes documented in this report are preliminary and upon coder review may  be revised to meet current compliance requirements. Barney Drain, MD Barney Drain MD, MD 08/16/2019 11:02:42 AM This report has been signed electronically. Number of Addenda: 0

## 2019-08-16 NOTE — Anesthesia Preprocedure Evaluation (Signed)
Anesthesia Evaluation  Patient identified by MRN, date of birth, ID band Patient awake    Reviewed: Allergy & Precautions, NPO status , Patient's Chart, lab work & pertinent test results  History of Anesthesia Complications (+) PONV and history of anesthetic complications  Airway Mallampati: II  TM Distance: >3 FB Neck ROM: Full    Dental no notable dental hx. (+) Edentulous Upper, Edentulous Lower   Pulmonary neg pulmonary ROS, former smoker,    Pulmonary exam normal breath sounds clear to auscultation       Cardiovascular Exercise Tolerance: Good hypertension, Pt. on medications and Pt. 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 Since reports good ET Denies CP or NTG use   Neuro/Psych Anxiety Depression negative neurological ROS  negative psych ROS   GI/Hepatic Neg liver ROS, GERD  Medicated and Controlled,  Endo/Other  negative endocrine ROSdiabetes, Type 2  Renal/GU negative Renal ROS  negative genitourinary   Musculoskeletal  (+) Arthritis , Osteoarthritis,    Abdominal   Peds negative pediatric ROS (+)  Hematology negative hematology ROS (+)   Anesthesia Other Findings   Reproductive/Obstetrics negative OB ROS                             Anesthesia Physical Anesthesia Plan  ASA: III  Anesthesia Plan: General   Post-op Pain Management:    Induction: Intravenous  PONV Risk Score and Plan: Midazolam, Propofol infusion, Dexamethasone, Ondansetron and Treatment may vary due to age or medical condition  Airway Management Planned: Nasal Cannula and Simple Face Mask  Additional Equipment:   Intra-op Plan:   Post-operative Plan:   Informed Consent: I have reviewed the patients History and Physical, chart, labs and discussed the procedure including the risks,  benefits and alternatives for the proposed anesthesia with the patient or authorized representative who has indicated his/her understanding and acceptance.     Dental advisory given  Plan Discussed with: CRNA  Anesthesia Plan Comments: (Plan Full PPE use  Plan GA with GETA as needed d/w pt -WTP with same after Q&A)        Anesthesia Quick Evaluation

## 2019-08-17 LAB — SURGICAL PATHOLOGY

## 2019-08-19 ENCOUNTER — Other Ambulatory Visit: Payer: Self-pay

## 2019-08-19 ENCOUNTER — Telehealth (HOSPITAL_COMMUNITY): Payer: Self-pay | Admitting: Psychiatry

## 2019-08-19 ENCOUNTER — Ambulatory Visit (HOSPITAL_COMMUNITY): Payer: Medicaid Other | Admitting: Psychiatry

## 2019-08-22 ENCOUNTER — Encounter (HOSPITAL_COMMUNITY): Payer: Self-pay | Admitting: Gastroenterology

## 2019-08-26 ENCOUNTER — Other Ambulatory Visit (HOSPITAL_COMMUNITY): Payer: Self-pay | Admitting: Psychiatry

## 2019-08-26 ENCOUNTER — Other Ambulatory Visit: Payer: Self-pay | Admitting: Family Medicine

## 2019-08-26 DIAGNOSIS — I10 Essential (primary) hypertension: Secondary | ICD-10-CM

## 2019-09-02 ENCOUNTER — Ambulatory Visit (INDEPENDENT_AMBULATORY_CARE_PROVIDER_SITE_OTHER): Payer: Medicaid Other | Admitting: Psychiatry

## 2019-09-02 ENCOUNTER — Encounter (HOSPITAL_COMMUNITY): Payer: Self-pay | Admitting: Psychiatry

## 2019-09-02 ENCOUNTER — Other Ambulatory Visit: Payer: Self-pay

## 2019-09-02 DIAGNOSIS — F321 Major depressive disorder, single episode, moderate: Secondary | ICD-10-CM | POA: Diagnosis not present

## 2019-09-02 MED ORDER — VIIBRYD 10 MG PO TABS: 10.0000 mg | ORAL_TABLET | Freq: Every day | ORAL | 3 refills | Status: DC

## 2019-09-02 MED ORDER — BUPROPION HCL ER (XL) 300 MG PO TB24: 300.0000 mg | ORAL_TABLET | Freq: Every morning | ORAL | 3 refills | Status: DC

## 2019-09-02 MED ORDER — ALPRAZOLAM 1 MG PO TABS: ORAL_TABLET | ORAL | 3 refills | Status: DC
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
## 2019-09-28 DIAGNOSIS — Z23 Encounter for immunization: Secondary | ICD-10-CM | POA: Diagnosis not present

## 2019-10-19 DIAGNOSIS — Z48298 Encounter for aftercare following other organ transplant: Secondary | ICD-10-CM | POA: Diagnosis not present

## 2019-10-19 DIAGNOSIS — R7889 Finding of other specified substances, not normally found in blood: Secondary | ICD-10-CM | POA: Diagnosis not present

## 2019-10-19 DIAGNOSIS — Z941 Heart transplant status: Secondary | ICD-10-CM | POA: Diagnosis not present

## 2019-10-19 DIAGNOSIS — Z79899 Other long term (current) drug therapy: Secondary | ICD-10-CM | POA: Diagnosis not present

## 2019-10-19 DIAGNOSIS — E559 Vitamin D deficiency, unspecified: Secondary | ICD-10-CM | POA: Diagnosis not present

## 2019-10-19 DIAGNOSIS — E785 Hyperlipidemia, unspecified: Secondary | ICD-10-CM | POA: Diagnosis not present

## 2019-10-25 ENCOUNTER — Other Ambulatory Visit: Payer: Self-pay | Admitting: Family Medicine

## 2019-10-25 NOTE — Telephone Encounter (Signed)
Needs to make an appointment.

## 2019-11-06 ENCOUNTER — Encounter (HOSPITAL_COMMUNITY): Payer: Self-pay | Admitting: Emergency Medicine

## 2019-11-06 ENCOUNTER — Emergency Department (HOSPITAL_COMMUNITY)
Admission: EM | Admit: 2019-11-06 | Discharge: 2019-11-06 | Disposition: A | Discharge status: Home / Self Care | Payer: Medicaid Other | Attending: Emergency Medicine | Admitting: Emergency Medicine

## 2019-11-06 ENCOUNTER — Emergency Department (HOSPITAL_COMMUNITY): Payer: Medicaid Other

## 2019-11-06 ENCOUNTER — Other Ambulatory Visit: Payer: Self-pay

## 2019-11-06 DIAGNOSIS — G510 Bell's palsy: Secondary | ICD-10-CM | POA: Diagnosis not present

## 2019-11-06 DIAGNOSIS — E119 Type 2 diabetes mellitus without complications: Secondary | ICD-10-CM | POA: Diagnosis not present

## 2019-11-06 DIAGNOSIS — Z7984 Long term (current) use of oral hypoglycemic drugs: Secondary | ICD-10-CM | POA: Insufficient documentation

## 2019-11-06 DIAGNOSIS — Z79899 Other long term (current) drug therapy: Secondary | ICD-10-CM | POA: Insufficient documentation

## 2019-11-06 DIAGNOSIS — R29810 Facial weakness: Secondary | ICD-10-CM | POA: Diagnosis not present

## 2019-11-06 DIAGNOSIS — Z941 Heart transplant status: Secondary | ICD-10-CM | POA: Insufficient documentation

## 2019-11-06 DIAGNOSIS — I119 Hypertensive heart disease without heart failure: Secondary | ICD-10-CM | POA: Diagnosis not present

## 2019-11-06 DIAGNOSIS — I10 Essential (primary) hypertension: Secondary | ICD-10-CM | POA: Diagnosis not present

## 2019-11-06 LAB — COMPREHENSIVE METABOLIC PANEL
ALT: 17 U/L (ref 0–44)
AST: 18 U/L (ref 15–41)
Albumin: 5 g/dL (ref 3.5–5.0)
Alkaline Phosphatase: 64 U/L (ref 38–126)
Anion gap: 11 (ref 5–15)
BUN: 22 mg/dL (ref 8–23)
CO2: 26 mmol/L (ref 22–32)
Calcium: 9.6 mg/dL (ref 8.9–10.3)
Chloride: 103 mmol/L (ref 98–111)
Creatinine, Ser: 1.67 mg/dL — ABNORMAL HIGH (ref 0.61–1.24)
GFR calc Af Amer: 50 mL/min — ABNORMAL LOW (ref >60)
GFR calc non Af Amer: 44 mL/min — ABNORMAL LOW (ref >60)
Glucose, Bld: 112 mg/dL — ABNORMAL HIGH (ref 70–99)
Potassium: 4.5 mmol/L (ref 3.5–5.1)
Sodium: 140 mmol/L (ref 135–145)
Total Bilirubin: 0.8 mg/dL (ref 0.3–1.2)
Total Protein: 8.3 g/dL — ABNORMAL HIGH (ref 6.5–8.1)

## 2019-11-06 LAB — CBC WITH DIFFERENTIAL/PLATELET
Abs Immature Granulocytes: 0.02 10*3/uL (ref 0.00–0.07)
Basophils Absolute: 0 10*3/uL (ref 0.0–0.1)
Basophils Relative: 1 %
Eosinophils Absolute: 0.1 10*3/uL (ref 0.0–0.5)
Eosinophils Relative: 2 %
HCT: 51.5 % (ref 39.0–52.0)
Hemoglobin: 15.7 g/dL (ref 13.0–17.0)
Immature Granulocytes: 0 %
Lymphocytes Relative: 36 %
Lymphs Abs: 2.6 10*3/uL (ref 0.7–4.0)
MCH: 27 pg (ref 26.0–34.0)
MCHC: 30.5 g/dL (ref 30.0–36.0)
MCV: 88.6 fL (ref 80.0–100.0)
Monocytes Absolute: 0.7 10*3/uL (ref 0.1–1.0)
Monocytes Relative: 10 %
Neutro Abs: 3.7 10*3/uL (ref 1.7–7.7)
Neutrophils Relative %: 51 %
Platelets: 168 10*3/uL (ref 150–400)
RBC: 5.81 MIL/uL (ref 4.22–5.81)
RDW: 13.2 % (ref 11.5–15.5)
WBC: 7.3 10*3/uL (ref 4.0–10.5)
nRBC: 0 % (ref 0.0–0.2)

## 2019-11-06 MED ORDER — ARTIFICIAL TEARS OPHTHALMIC OINT: TOPICAL_OINTMENT | OPHTHALMIC | 1 refills | Status: DC | PRN

## 2019-11-06 MED ORDER — VALACYCLOVIR HCL 1 G PO TABS: 1000.0000 mg | ORAL_TABLET | Freq: Three times a day (TID) | ORAL | 0 refills | Status: AC

## 2019-11-06 MED ORDER — PREDNISONE 10 MG PO TABS: ORAL_TABLET | ORAL | 0 refills | Status: DC

## 2019-11-06 NOTE — ED Triage Notes (Signed)
He started having trouble with his vision about 1200 yesterday

## 2019-11-06 NOTE — ED Triage Notes (Signed)
Pt states that he started having right side facial droop and trouble shutting his right eye. This started last night.
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
## 2019-11-06 NOTE — Discharge Instructions (Addendum)
Return if any problems.  See your Physician for recheck this week  

## 2019-11-24 ENCOUNTER — Other Ambulatory Visit: Payer: Self-pay | Admitting: Family Medicine

## 2019-11-24 DIAGNOSIS — I10 Essential (primary) hypertension: Secondary | ICD-10-CM

## 2019-11-24 DIAGNOSIS — N529 Male erectile dysfunction, unspecified: Secondary | ICD-10-CM

## 2019-11-25 ENCOUNTER — Other Ambulatory Visit: Payer: Self-pay | Admitting: Family Medicine

## 2019-11-25 DIAGNOSIS — N529 Male erectile dysfunction, unspecified: Secondary | ICD-10-CM

## 2019-11-25 NOTE — Telephone Encounter (Signed)
What is the name of the medication? Sildenafil  Have you contacted your pharmacy to request a refill? Yes  Which pharmacy would you like this sent to? [facility]   Patient notified that their request is being sent to the clinical staff for review and that they should receive a call once it is complete. If they do not receive a call within 24 hours they can check with their pharmacy or our office.   Dettinger's pt.  2 of the meds were filled, but this one was left out.

## 2019-11-27 MED ORDER — SILDENAFIL CITRATE 20 MG PO TABS: 40.0000 mg | ORAL_TABLET | ORAL | 2 refills | Status: DC | PRN

## 2019-12-26 ENCOUNTER — Other Ambulatory Visit: Payer: Self-pay | Admitting: Family Medicine

## 2019-12-26 DIAGNOSIS — I10 Essential (primary) hypertension: Secondary | ICD-10-CM

## 2019-12-27 NOTE — Telephone Encounter (Signed)
Dettinger. NTBS 30 days given 11/25/19

## 2019-12-28 ENCOUNTER — Ambulatory Visit (INDEPENDENT_AMBULATORY_CARE_PROVIDER_SITE_OTHER): Payer: Medicaid Other | Admitting: Family Medicine

## 2019-12-28 ENCOUNTER — Encounter: Payer: Self-pay | Admitting: Family Medicine

## 2019-12-28 DIAGNOSIS — K219 Gastro-esophageal reflux disease without esophagitis: Secondary | ICD-10-CM

## 2019-12-28 DIAGNOSIS — E1122 Type 2 diabetes mellitus with diabetic chronic kidney disease: Secondary | ICD-10-CM | POA: Diagnosis not present

## 2019-12-28 DIAGNOSIS — E1159 Type 2 diabetes mellitus with other circulatory complications: Secondary | ICD-10-CM | POA: Diagnosis not present

## 2019-12-28 DIAGNOSIS — I152 Hypertension secondary to endocrine disorders: Secondary | ICD-10-CM

## 2019-12-28 DIAGNOSIS — N182 Chronic kidney disease, stage 2 (mild): Secondary | ICD-10-CM | POA: Diagnosis not present

## 2019-12-28 DIAGNOSIS — I10 Essential (primary) hypertension: Secondary | ICD-10-CM | POA: Diagnosis not present

## 2019-12-28 MED ORDER — CARVEDILOL 12.5 MG PO TABS: 12.5000 mg | ORAL_TABLET | Freq: Two times a day (BID) | ORAL | 3 refills | Status: DC

## 2019-12-28 MED ORDER — ESOMEPRAZOLE MAGNESIUM 20 MG PO CPDR: 20.0000 mg | DELAYED_RELEASE_CAPSULE | Freq: Two times a day (BID) | ORAL | 3 refills | Status: DC

## 2019-12-28 NOTE — Progress Notes (Signed)
Virtual Visit via telephone Note  I connected with Anthony Price on 12/28/19 at 1005 by telephone and verified that I am speaking with the correct person using two identifiers. Anthony Price is currently located at home and no other people are currently with her during visit. The provider, Fransisca Kaufmann Deserae Jennings, MD is located in their office at time of visit.  Call ended at 1020  I discussed the limitations, risks, security and privacy concerns of performing an evaluation and management service by telephone and the availability of in person appointments. I also discussed with the patient that there may be a patient responsible charge related to this service. The patient expressed understanding and agreed to proceed.  130/87 History and Present Illness: Type 2 diabetes mellitus Patient comes in today for recheck of his diabetes. Patient has been currently taking metformin and blood sugar 128-130. Patient is currently on an ACE inhibitor/ARB. Patient has not seen an ophthalmologist this year. Patient denies any issues with their feet. Patient has ckd and heart transplant  Hypertension Patient is currently on ramipril and carvedilol, and their blood pressure today is 130/87. Patient denies any lightheadedness or dizziness. Patient denies headaches, blurred vision, chest pains, shortness of breath, or weakness. Denies any side effects from medication and is content with current medication.   Hyperlipidemia Patient is coming in for recheck of his hyperlipidemia. The patient is currently taking atorvastatin. They deny any issues with myalgias or history of liver damage from it. They deny any focal numbness or weakness or chest pain.   GERD Patient is currently on nexium.  She denies any major symptoms or abdominal pain or belching or burping. She denies any blood in her stool or lightheadedness or dizziness.   No diagnosis found.  Outpatient Encounter Medications as of 12/28/2019    Medication Sig  . ACCU-CHEK SOFTCLIX LANCETS lancets Test once qd. DX E11.9  . acetaminophen (TYLENOL) 500 MG tablet Take 1,000 mg by mouth every 6 (six) hours as needed for moderate pain. Pain   . ALPRAZolam (XANAX) 1 MG tablet TAKE (1) TABLET BY MOUTH (3) TIMES DAILY AS NEEDED FOR ANXIETY.  Marland Kitchen artificial tears (LACRILUBE) OINT ophthalmic ointment Place into the right eye every 3 (three) hours as needed for dry eyes.  Marland Kitchen aspirin EC 81 MG tablet Take 81 mg by mouth daily.  Marland Kitchen atorvastatin (LIPITOR) 40 MG tablet Take 1 tablet (40 mg total) by mouth daily.  . Blood Glucose Monitoring Suppl (ACCU-CHEK AVIVA PLUS) w/Device KIT Test qd. DX E11.9  . buPROPion (WELLBUTRIN XL) 300 MG 24 hr tablet Take 1 tablet (300 mg total) by mouth every morning.  . carvedilol (COREG) 12.5 MG tablet Take 1 tablet (12.5 mg total) by mouth 2 (two) times daily. (Needs to be seen before next refill)  . cholecalciferol (VITAMIN D3) 25 MCG (1000 UT) tablet Take 1,000 Units by mouth daily.  Marland Kitchen esomeprazole (NEXIUM) 20 MG capsule TAKE 1 CAPSULE BY MOUTH TWICE DAILY BEFORE A MEAL. (Needs to be seen before next refill)  . glucose blood (ACCU-CHEK AVIVA PLUS) test strip Test once qd. DX E11.9  . Insulin Pen Needle (PEN NEEDLES 31GX5/16") 31G X 8 MM MISC 1 each by Does not apply route daily.  . metFORMIN (GLUCOPHAGE) 500 MG tablet TAKE ONE TABLET BY MOUTH 2 TIMES A DAY WITH A MEAL. (Patient taking differently: Take 500 mg by mouth 2 (two) times daily with a meal. )  . mycophenolate (CELLCEPT) 500 MG tablet Take 500-1,000 mg  by mouth See admin instructions. Take 500 mg by mouth in the morning and 1000 mg at night  . Omega-3 1000 MG CAPS Take 1,000 mg by mouth daily.  . predniSONE (DELTASONE) 10 MG tablet Taper 6,5,4,3,2,1  . Probiotic Product (PROBIOTIC DAILY PO) Take 1 capsule by mouth daily.   . ramipril (ALTACE) 2.5 MG capsule Take 2.5 mg by mouth daily.  . sildenafil (REVATIO) 20 MG tablet Take 2-3 tablets (40-60 mg total) by  mouth as needed.  . tacrolimus (PROGRAF) 1 MG capsule Take 0.5 mg by mouth 2 (two) times daily.   . Vilazodone HCl (VIIBRYD) 10 MG TABS Take 1 tablet (10 mg total) by mouth daily.   No facility-administered encounter medications on file as of 12/28/2019.    Review of Systems  Constitutional: Negative for chills and fever.  Eyes: Negative for visual disturbance.  Respiratory: Negative for shortness of breath and wheezing.   Cardiovascular: Negative for chest pain and leg swelling.  Musculoskeletal: Negative for back pain and gait problem.  Skin: Negative for rash.  Neurological: Negative for dizziness, weakness and light-headedness.  All other systems reviewed and are negative.   Observations/Objective: Patient sounds comfortable and in no acute distress.  Assessment and Plan: Problem List Items Addressed This Visit      Cardiovascular and Mediastinum   Hypertension associated with diabetes ([place])   Relevant Medications   carvedilol (COREG) 12.5 MG tablet     Digestive   GERD (gastroesophageal reflux disease)   Relevant Medications   esomeprazole (NEXIUM) 20 MG capsule     Endocrine   Diabetes type 2, controlled ([place]) - Primary    Other Visit Diagnoses    Essential hypertension       Relevant Medications   carvedilol (COREG) 12.5 MG tablet      nexium and carvedilol continued  Follow up plan: Return in about 3 months (around 03/29/2020), or if symptoms worsen or fail to improve, for diabetes and htn.     I discussed the assessment and treatment plan with the patient. The patient was provided an opportunity to ask questions and all were answered. The patient agreed with the plan and demonstrated an understanding of the instructions.   The patient was advised to call back or seek an in-person evaluation if the symptoms worsen or if the condition fails to improve as anticipated.  The above assessment and management plan was discussed with the patient. The patient  verbalized understanding of and has agreed to the management plan. Patient is aware to call the clinic if symptoms persist or worsen. Patient is aware when to return to the clinic for a follow-up visit. Patient educated on when it is appropriate to go to the emergency department.    I provided 15 minutes of non-face-to-face time during this encounter.    [name], MD

## 2020-01-03 ENCOUNTER — Ambulatory Visit (INDEPENDENT_AMBULATORY_CARE_PROVIDER_SITE_OTHER): Payer: Medicaid Other | Admitting: Psychiatry

## 2020-01-03 ENCOUNTER — Encounter (HOSPITAL_COMMUNITY): Payer: Self-pay | Admitting: Psychiatry

## 2020-01-03 ENCOUNTER — Other Ambulatory Visit: Payer: Self-pay

## 2020-01-03 DIAGNOSIS — F321 Major depressive disorder, single episode, moderate: Secondary | ICD-10-CM | POA: Diagnosis not present

## 2020-01-03 MED ORDER — ALPRAZOLAM 1 MG PO TABS: ORAL_TABLET | ORAL | 3 refills | Status: DC

## 2020-01-03 MED ORDER — VIIBRYD 10 MG PO TABS: 10.0000 mg | ORAL_TABLET | Freq: Every day | ORAL | 3 refills | Status: DC

## 2020-01-03 MED ORDER — BUPROPION HCL ER (XL) 300 MG PO TB24: 300.0000 mg | ORAL_TABLET | Freq: Every morning | ORAL | 3 refills | Status: DC
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
## 2020-01-23 ENCOUNTER — Other Ambulatory Visit: Payer: Self-pay | Admitting: Family Medicine

## 2020-02-08 DIAGNOSIS — E785 Hyperlipidemia, unspecified: Secondary | ICD-10-CM | POA: Diagnosis not present

## 2020-02-08 DIAGNOSIS — Z79899 Other long term (current) drug therapy: Secondary | ICD-10-CM | POA: Diagnosis not present

## 2020-02-08 DIAGNOSIS — Z941 Heart transplant status: Secondary | ICD-10-CM | POA: Diagnosis not present

## 2020-02-08 DIAGNOSIS — R7989 Other specified abnormal findings of blood chemistry: Secondary | ICD-10-CM | POA: Diagnosis not present

## 2020-02-08 DIAGNOSIS — Z48298 Encounter for aftercare following other organ transplant: Secondary | ICD-10-CM | POA: Diagnosis not present

## 2020-02-08 DIAGNOSIS — E559 Vitamin D deficiency, unspecified: Secondary | ICD-10-CM | POA: Diagnosis not present

## 2020-02-24 ENCOUNTER — Other Ambulatory Visit: Payer: Self-pay | Admitting: Family Medicine

## 2020-02-24 NOTE — Telephone Encounter (Signed)
Ov 03/30/20

## 2020-03-13 DIAGNOSIS — I10 Essential (primary) hypertension: Secondary | ICD-10-CM | POA: Diagnosis not present

## 2020-03-13 DIAGNOSIS — R0609 Other forms of dyspnea: Secondary | ICD-10-CM | POA: Diagnosis not present

## 2020-03-13 DIAGNOSIS — R931 Abnormal findings on diagnostic imaging of heart and coronary circulation: Secondary | ICD-10-CM | POA: Diagnosis not present

## 2020-03-13 DIAGNOSIS — E785 Hyperlipidemia, unspecified: Secondary | ICD-10-CM | POA: Diagnosis not present

## 2020-03-13 DIAGNOSIS — Z125 Encounter for screening for malignant neoplasm of prostate: Secondary | ICD-10-CM | POA: Diagnosis not present

## 2020-03-13 DIAGNOSIS — Z4821 Encounter for aftercare following heart transplant: Secondary | ICD-10-CM | POA: Diagnosis not present

## 2020-03-13 DIAGNOSIS — Z941 Heart transplant status: Secondary | ICD-10-CM | POA: Diagnosis not present

## 2020-03-13 DIAGNOSIS — N182 Chronic kidney disease, stage 2 (mild): Secondary | ICD-10-CM | POA: Diagnosis not present

## 2020-03-21 DIAGNOSIS — Z941 Heart transplant status: Secondary | ICD-10-CM | POA: Diagnosis not present

## 2020-03-21 DIAGNOSIS — N183 Chronic kidney disease, stage 3 unspecified: Secondary | ICD-10-CM | POA: Diagnosis not present

## 2020-03-21 DIAGNOSIS — I10 Essential (primary) hypertension: Secondary | ICD-10-CM | POA: Diagnosis not present

## 2020-03-21 DIAGNOSIS — R0989 Other specified symptoms and signs involving the circulatory and respiratory systems: Secondary | ICD-10-CM | POA: Diagnosis not present

## 2020-03-30 ENCOUNTER — Ambulatory Visit: Payer: Medicaid Other | Admitting: Family Medicine

## 2020-03-30 ENCOUNTER — Other Ambulatory Visit: Payer: Self-pay

## 2020-03-30 ENCOUNTER — Encounter: Payer: Self-pay | Admitting: Family Medicine

## 2020-03-30 VITALS — BP 141/71 | HR 97 | Temp 98.5°F | Ht 68.0 in | Wt 138.0 lb

## 2020-03-30 DIAGNOSIS — I10 Essential (primary) hypertension: Secondary | ICD-10-CM | POA: Diagnosis not present

## 2020-03-30 DIAGNOSIS — E1122 Type 2 diabetes mellitus with diabetic chronic kidney disease: Secondary | ICD-10-CM | POA: Diagnosis not present

## 2020-03-30 DIAGNOSIS — N182 Chronic kidney disease, stage 2 (mild): Secondary | ICD-10-CM

## 2020-03-30 DIAGNOSIS — Z125 Encounter for screening for malignant neoplasm of prostate: Secondary | ICD-10-CM | POA: Diagnosis not present

## 2020-03-30 DIAGNOSIS — E1159 Type 2 diabetes mellitus with other circulatory complications: Secondary | ICD-10-CM | POA: Diagnosis not present

## 2020-03-30 DIAGNOSIS — K219 Gastro-esophageal reflux disease without esophagitis: Secondary | ICD-10-CM

## 2020-03-30 DIAGNOSIS — I152 Hypertension secondary to endocrine disorders: Secondary | ICD-10-CM

## 2020-03-30 LAB — BAYER DCA HB A1C WAIVED: HB A1C (BAYER DCA - WAIVED): 6.9 % (ref <7.0)

## 2020-03-30 NOTE — Progress Notes (Signed)
BP (!) 141/71   Pulse 97   Temp 98.5 F (36.9 C)   Ht _0  (1.727 m)   Wt 138 lb (62.6 kg)   SpO2 98%   BMI 20.98 kg/m    Subjective:   Patient ID: Anthony Price, male    DOB: 1956-05-15, 64 y.o.   MRN: [id number]  HPI: WISE FEES is a 64 y.o. male presenting on 03/30/2020 for Medical Management of Chronic Issues, Diabetes, and Hypertension   HPI Type 2 diabetes mellitus Patient comes in today for recheck of his diabetes. Patient has been currently taking metformin. Patient is currently on an ACE inhibitor/ARB. Patient has not seen an ophthalmologist this year. Patient denies any issues with their feet. The symptom started onset as an adult hypertension ARE RELATED TO DM   Hypertension Patient is currently on carvedilol and ramipril, and their blood pressure today is 141/71 although at home he has been running good in the 100s to 120s most of the time. Patient denies any lightheadedness or dizziness. Patient denies headaches, blurred vision, chest pains, shortness of breath, or weakness. Denies any side effects from medication and is content with current medication.   GERD Patient is currently on Nexium.  She denies any major symptoms or abdominal pain or belching or burping. She denies any blood in her stool or lightheadedness or dizziness.   Patient has history of renal transplant and CKD and follows up with transplant team and has been doing really well with that.  Patient has depression and anxiety has been worsened recently because over the past 4 months he is found to his wife and started drinking again and she has been an alcoholic and then she started sleeping with her ex-husband.  They have been married for 15 years so this is quite a shock for him and she is pretty open about leaving and sleeping with her ex-husband and then coming back home.  He does have an appointment to see Dr. Harrington Challenger soon and will discuss this with her.  He does have people he can talk to denies  any suicidal ideations. Depression screen [facility] 2/9 03/30/2020 10/07/2018 07/26/2018 01/21/2018 07/24/2017  Decreased Interest _1 0  Down, Depressed, Hopeless _2 0  PHQ - 2 Score _3 0  Altered sleeping 2 3 0 0 -  Tired, decreased energy _4 -  Change in appetite _5 -  Feeling bad or failure about yourself  3 2 0 1 -  Trouble concentrating _6 -  Moving slowly or fidgety/restless 0 2 0 0 -  Suicidal thoughts 0 0 0 0 -  PHQ-9 Score _7 -     Relevant past medical, surgical, family and social history reviewed and updated as indicated. Interim medical history since our last visit reviewed. Allergies and medications reviewed and updated.  Review of Systems  Constitutional: Negative for chills and fever.  Eyes: Negative for discharge.  Respiratory: Negative for shortness of breath and wheezing.   Cardiovascular: Negative for chest pain and leg swelling.  Musculoskeletal: Negative for back pain and gait problem.  Skin: Negative for rash.  Neurological: Negative for dizziness, weakness and light-headedness.  Psychiatric/Behavioral: Positive for dysphoric mood and sleep disturbance. Negative for self-injury and suicidal ideas. The patient is nervous/anxious.   All other systems reviewed and are negative.   Per HPI unless specifically indicated above   Allergies as  of 03/30/2020      Reactions   Lactose Intolerance (gi)    bloating   Tramadol Nausea Only      Medication List       Accurate as of March 30, 2020  9:17 AM. If you have any questions, ask your nurse or doctor.        STOP taking these medications   artificial tears Oint ophthalmic ointment Commonly known as: LACRILUBE Stopped by: [name], MD   cholecalciferol 25 MCG (1000 UNIT) tablet Commonly known as: VITAMIN D3 Stopped by: Fransisca Kaufmann Madgeline Rayo, MD   predniSONE 10 MG tablet Commonly known as: DELTASONE Stopped by: [name], MD     TAKE these medications     Accu-Chek Aviva Plus w/Device Kit Test qd. DX E11.9   Accu-Chek Softclix Lancets lancets Test once qd. DX E11.9   acetaminophen 500 MG tablet Commonly known as: TYLENOL Take 1,000 mg by mouth every 6 (six) hours as needed for moderate pain. Pain   ALPRAZolam 1 MG tablet Commonly known as: XANAX TAKE (1) TABLET BY MOUTH (3) TIMES DAILY AS NEEDED FOR ANXIETY.   aspirin EC 81 MG tablet Take 81 mg by mouth daily.   atorvastatin 40 MG tablet Commonly known as: LIPITOR TAKE ONE TABLET BY MOUTH ONCE DAILY.   buPROPion 300 MG 24 hr tablet Commonly known as: WELLBUTRIN XL Take 1 tablet (300 mg total) by mouth every morning.   carvedilol 12.5 MG tablet Commonly known as: COREG Take 1 tablet (12.5 mg total) by mouth 2 (two) times daily.   esomeprazole 20 MG capsule Commonly known as: NEXIUM Take 1 capsule (20 mg total) by mouth 2 (two) times daily before a meal.   glucose blood test strip Commonly known as: Accu-Chek Aviva Plus Test once qd. DX E11.9   magnesium oxide 400 MG tablet Commonly known as: MAG-OX Take 400 mg by mouth daily.   metFORMIN 500 MG tablet Commonly known as: GLUCOPHAGE TAKE ONE TABLET BY MOUTH 2 TIMES A DAY WITH A MEAL. What changed:   how much to take  how to take this  when to take this  additional instructions   mycophenolate 500 MG tablet Commonly known as: CELLCEPT Take 500-1,000 mg by mouth See admin instructions. Take 500 mg by mouth in the morning and 1000 mg at night   Omega-3 1000 MG Caps Take 1,000 mg by mouth daily.   PEN NEEDLES 31GX5/16" 31G X 8 MM Misc 1 each by Does not apply route daily.   PROBIOTIC DAILY PO Take 1 capsule by mouth daily.   ramipril 2.5 MG capsule Commonly known as: ALTACE Take 2.5 mg by mouth daily.   sildenafil 20 MG tablet Commonly known as: REVATIO Take 2-3 tablets (40-60 mg total) by mouth as needed.   tacrolimus 1 MG capsule Commonly known as: PROGRAF Take 0.5 mg by mouth 2 (two) times  daily.   Viibryd 10 MG Tabs Generic drug: Vilazodone HCl Take 1 tablet (10 mg total) by mouth daily.        Objective:   BP (!) 141/71   Pulse 97   Temp 98.5 F (36.9 C)   Ht _0  (1.727 m)   Wt 138 lb (62.6 kg)   SpO2 98%   BMI 20.98 kg/m   Wt Readings from Last 3 Encounters:  03/30/20 138 lb (62.6 kg)  11/06/19 138 lb (62.6 kg)  08/12/19 143 lb (64.9 kg)    Physical Exam Vitals and nursing  note reviewed.  Constitutional:      General: He is not in acute distress.    Appearance: He is well-developed. He is not diaphoretic.  Eyes:     General: No scleral icterus.    Conjunctiva/sclera: Conjunctivae normal.  Neck:     Thyroid: No thyromegaly.  Cardiovascular:     Rate and Rhythm: Normal rate and regular rhythm.     Heart sounds: Normal heart sounds. No murmur.  Pulmonary:     Effort: Pulmonary effort is normal. No respiratory distress.     Breath sounds: Normal breath sounds. No wheezing.  Musculoskeletal:        General: Normal range of motion.     Cervical back: Neck supple.  Lymphadenopathy:     Cervical: No cervical adenopathy.  Skin:    General: Skin is warm and dry.     Findings: No rash.  Neurological:     Mental Status: He is alert and oriented to person, place, and time.     Coordination: Coordination normal.  Psychiatric:        Behavior: Behavior normal.     A1c is 6.9  Assessment & Plan:   Problem List Items Addressed This Visit      Cardiovascular and Mediastinum   Hypertension associated with diabetes ([place])     Digestive   GERD (gastroesophageal reflux disease)   Relevant Medications   magnesium oxide (MAG-OX) 400 MG tablet     Endocrine   Diabetes type 2, controlled ([place]) - Primary   Relevant Orders   Bayer DCA Hb A1c Waived    Other Visit Diagnoses    Essential hypertension       Relevant Orders   CBC with Differential/Platelet   CMP14+EGFR   Lipid panel   Screening PSA (prostate specific antigen)       Relevant  Orders   PSA, total and free      A1c is 6.9 which is slightly up from where he has been but still under control and will focus on diet and lifestyle modification. Follow up plan: Return in about 3 months (around 06/30/2020), or if symptoms worsen or fail to improve, for Hypertension and diabetes recheck.  Counseling provided for all of the vaccine components Orders Placed This Encounter  Procedures  . CBC with Differential/Platelet  . CMP14+EGFR  . Lipid panel  . PSA, total and free  . Bayer [facility] Hb A1c [place], MD [facility] 03/30/2020, 9:17 AM

## 2020-03-31 LAB — CMP14+EGFR
ALT: 8 IU/L (ref 0–44)
AST: 12 IU/L (ref 0–40)
Albumin/Globulin Ratio: 2.4 — ABNORMAL HIGH (ref 1.2–2.2)
Albumin: 4.7 g/dL (ref 3.8–4.8)
Alkaline Phosphatase: 57 IU/L (ref 48–121)
BUN/Creatinine Ratio: 15 (ref 10–24)
BUN: 22 mg/dL (ref 8–27)
Bilirubin Total: 0.6 mg/dL (ref 0.0–1.2)
CO2: 20 mmol/L (ref 20–29)
Calcium: 9.1 mg/dL (ref 8.6–10.2)
Chloride: 102 mmol/L (ref 96–106)
Creatinine, Ser: 1.5 mg/dL — ABNORMAL HIGH (ref 0.76–1.27)
GFR calc Af Amer: 57 mL/min/{1.73_m2} — ABNORMAL LOW (ref >59)
GFR calc non Af Amer: 49 mL/min/{1.73_m2} — ABNORMAL LOW (ref >59)
Globulin, Total: 2 g/dL (ref 1.5–4.5)
Glucose: 218 mg/dL — ABNORMAL HIGH (ref 65–99)
Potassium: 4.3 mmol/L (ref 3.5–5.2)
Sodium: 137 mmol/L (ref 134–144)
Total Protein: 6.7 g/dL (ref 6.0–8.5)

## 2020-03-31 LAB — CBC WITH DIFFERENTIAL/PLATELET
Basophils Absolute: 0.1 10*3/uL (ref 0.0–0.2)
Basos: 1 %
EOS (ABSOLUTE): 0.1 10*3/uL (ref 0.0–0.4)
Eos: 2 %
Hematocrit: 41.5 % (ref 37.5–51.0)
Hemoglobin: 13.7 g/dL (ref 13.0–17.7)
Immature Grans (Abs): 0 10*3/uL (ref 0.0–0.1)
Immature Granulocytes: 0 %
Lymphocytes Absolute: 2 10*3/uL (ref 0.7–3.1)
Lymphs: 31 %
MCH: 27.7 pg (ref 26.6–33.0)
MCHC: 33 g/dL (ref 31.5–35.7)
MCV: 84 fL (ref 79–97)
Monocytes Absolute: 0.6 10*3/uL (ref 0.1–0.9)
Monocytes: 9 %
Neutrophils Absolute: 3.8 10*3/uL (ref 1.4–7.0)
Neutrophils: 57 %
Platelets: 150 10*3/uL (ref 150–450)
RBC: 4.95 x10E6/uL (ref 4.14–5.80)
RDW: 12.6 % (ref 11.6–15.4)
WBC: 6.5 10*3/uL (ref 3.4–10.8)

## 2020-03-31 LAB — LIPID PANEL
Chol/HDL Ratio: 3.8 ratio (ref 0.0–5.0)
Cholesterol, Total: 99 mg/dL — ABNORMAL LOW (ref 100–199)
HDL: 26 mg/dL — ABNORMAL LOW (ref >39)
LDL Chol Calc (NIH): 43 mg/dL (ref 0–99)
Triglycerides: 177 mg/dL — ABNORMAL HIGH (ref 0–149)
VLDL Cholesterol Cal: 30 mg/dL (ref 5–40)

## 2020-03-31 LAB — PSA, TOTAL AND FREE
PSA, Free Pct: 27.3 %
PSA, Free: 0.3 ng/mL
Prostate Specific Ag, Serum: 1.1 ng/mL (ref 0.0–4.0)

## 2020-04-25 ENCOUNTER — Other Ambulatory Visit (HOSPITAL_COMMUNITY): Payer: Self-pay | Admitting: Psychiatry

## 2020-04-25 DIAGNOSIS — F321 Major depressive disorder, single episode, moderate: Secondary | ICD-10-CM

## 2020-04-26 ENCOUNTER — Encounter (HOSPITAL_COMMUNITY): Payer: Self-pay | Admitting: *Deleted

## 2020-04-26 NOTE — Telephone Encounter (Signed)
LMOM

## 2020-04-26 NOTE — Telephone Encounter (Signed)
Call for appt

## 2020-05-01 ENCOUNTER — Other Ambulatory Visit: Payer: Self-pay

## 2020-05-01 ENCOUNTER — Encounter (HOSPITAL_COMMUNITY): Payer: Self-pay | Admitting: Psychiatry

## 2020-05-01 ENCOUNTER — Telehealth (INDEPENDENT_AMBULATORY_CARE_PROVIDER_SITE_OTHER): Payer: Medicaid Other | Admitting: Psychiatry

## 2020-05-01 DIAGNOSIS — F321 Major depressive disorder, single episode, moderate: Secondary | ICD-10-CM

## 2020-05-01 MED ORDER — ALPRAZOLAM 1 MG PO TABS: 1.0000 mg | ORAL_TABLET | Freq: Three times a day (TID) | ORAL | 2 refills | Status: DC

## 2020-05-01 MED ORDER — BUPROPION HCL ER (XL) 300 MG PO TB24: 300.0000 mg | ORAL_TABLET | Freq: Every morning | ORAL | 2 refills | Status: DC

## 2020-05-01 MED ORDER — VIIBRYD 10 MG PO TABS: 10.0000 mg | ORAL_TABLET | Freq: Every day | ORAL | 2 refills | Status: DC
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
## 2020-06-01 ENCOUNTER — Other Ambulatory Visit: Payer: Self-pay

## 2020-06-01 ENCOUNTER — Encounter (HOSPITAL_COMMUNITY): Payer: Self-pay | Admitting: Psychiatry

## 2020-06-01 ENCOUNTER — Telehealth (INDEPENDENT_AMBULATORY_CARE_PROVIDER_SITE_OTHER): Payer: Medicaid Other | Admitting: Psychiatry

## 2020-06-01 DIAGNOSIS — F321 Major depressive disorder, single episode, moderate: Secondary | ICD-10-CM | POA: Diagnosis not present

## 2020-06-01 MED ORDER — ALPRAZOLAM 1 MG PO TABS: 1.0000 mg | ORAL_TABLET | Freq: Three times a day (TID) | ORAL | 2 refills | Status: DC

## 2020-06-01 MED ORDER — BUPROPION HCL ER (XL) 300 MG PO TB24: 300.0000 mg | ORAL_TABLET | Freq: Every morning | ORAL | 2 refills | Status: DC

## 2020-06-01 MED ORDER — VIIBRYD 10 MG PO TABS: 10.0000 mg | ORAL_TABLET | Freq: Every day | ORAL | 2 refills | Status: DC
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
## 2020-06-04 DIAGNOSIS — Z941 Heart transplant status: Secondary | ICD-10-CM | POA: Diagnosis not present

## 2020-06-04 DIAGNOSIS — E559 Vitamin D deficiency, unspecified: Secondary | ICD-10-CM | POA: Diagnosis not present

## 2020-06-04 DIAGNOSIS — Z79899 Other long term (current) drug therapy: Secondary | ICD-10-CM | POA: Diagnosis not present

## 2020-06-04 DIAGNOSIS — R7989 Other specified abnormal findings of blood chemistry: Secondary | ICD-10-CM | POA: Diagnosis not present

## 2020-06-04 DIAGNOSIS — Z48298 Encounter for aftercare following other organ transplant: Secondary | ICD-10-CM | POA: Diagnosis not present

## 2020-06-06 ENCOUNTER — Telehealth (HOSPITAL_COMMUNITY): Payer: Self-pay | Admitting: Psychiatry

## 2020-06-06 NOTE — Telephone Encounter (Signed)
Called patient to schedule 51m f/u left detailed voicemail

## 2020-07-04 ENCOUNTER — Other Ambulatory Visit: Payer: Self-pay

## 2020-07-04 ENCOUNTER — Ambulatory Visit (INDEPENDENT_AMBULATORY_CARE_PROVIDER_SITE_OTHER): Payer: Medicaid Other | Admitting: Family Medicine

## 2020-07-04 ENCOUNTER — Encounter: Payer: Self-pay | Admitting: Family Medicine

## 2020-07-04 VITALS — BP 118/65 | HR 84 | Temp 98.0°F | Ht 68.0 in | Wt 128.0 lb

## 2020-07-04 DIAGNOSIS — N182 Chronic kidney disease, stage 2 (mild): Secondary | ICD-10-CM | POA: Diagnosis not present

## 2020-07-04 DIAGNOSIS — E781 Pure hyperglyceridemia: Secondary | ICD-10-CM | POA: Insufficient documentation

## 2020-07-04 DIAGNOSIS — E1122 Type 2 diabetes mellitus with diabetic chronic kidney disease: Secondary | ICD-10-CM | POA: Diagnosis not present

## 2020-07-04 DIAGNOSIS — N1831 Chronic kidney disease, stage 3a: Secondary | ICD-10-CM | POA: Diagnosis not present

## 2020-07-04 DIAGNOSIS — N183 Chronic kidney disease, stage 3 unspecified: Secondary | ICD-10-CM | POA: Insufficient documentation

## 2020-07-04 DIAGNOSIS — K219 Gastro-esophageal reflux disease without esophagitis: Secondary | ICD-10-CM

## 2020-07-04 LAB — BAYER DCA HB A1C WAIVED: HB A1C (BAYER DCA - WAIVED): 6.3 % (ref <7.0)

## 2020-07-04 MED ORDER — ACCU-CHEK AVIVA PLUS W/DEVICE KIT: PACK | 0 refills | Status: DC

## 2020-07-04 NOTE — Progress Notes (Signed)
BP 118/65   Pulse 84   Temp 98 F (36.7 C)   Ht '5\' 8"'  (1.727 m)   Wt 128 lb (58.1 kg)   SpO2 100%   BMI 19.46 kg/m    Subjective:   Patient ID: Anthony Price, male    DOB: 06-16-1956, 64 y.o.   MRN: [id number]  HPI: Anthony Price is a 64 y.o. male presenting on 07/04/2020 for Medical Management of Chronic Issues, Diabetes, and Hypertension   HPI Type 2 diabetes mellitus Patient comes in today for recheck of his diabetes. Patient has been currently taking Metformin. Patient is currently on an ACE inhibitor/ARB. Patient has seen an ophthalmologist this year. Patient denies any issues with their feet. The symptom started onset as an adult hypertension and hypertriglyceridemia and CKD stage III ARE RELATED TO DM   Hypertension Patient is currently on carvedilol and ramipril, and their blood pressure today is 118/65. Patient denies any lightheadedness or dizziness. Patient denies headaches, blurred vision, chest pains, shortness of breath, or weakness. Denies any side effects from medication and is content with current medication.   Hypertriglyceridemia Patient is coming in for recheck of his hyperlipidemia. The patient is currently taking fish oils and atorvastatin. They deny any issues with myalgias or history of liver damage from it. They deny any focal numbness or weakness or chest pain.   Relevant past medical, surgical, family and social history reviewed and updated as indicated. Interim medical history since our last visit reviewed. Allergies and medications reviewed and updated.  Review of Systems  Constitutional: Negative for chills and fever.  Eyes: Negative for visual disturbance.  Respiratory: Negative for shortness of breath and wheezing.   Cardiovascular: Negative for chest pain and leg swelling.  Musculoskeletal: Negative for back pain and gait problem.  Skin: Negative for rash.  Neurological: Negative for dizziness, weakness and numbness.  All other systems  reviewed and are negative.   Per HPI unless specifically indicated above   Allergies as of 07/04/2020      Reactions   Lactose Intolerance (gi)    bloating   Tramadol Nausea Only      Medication List       Accurate as of July 04, 2020 11:19 AM. If you have any questions, ask your nurse or doctor.        Accu-Chek Aviva Plus w/Device Kit Test qd. DX E11.9   Accu-Chek Softclix Lancets lancets Test once qd. DX E11.9   acetaminophen 500 MG tablet Commonly known as: TYLENOL Take 1,000 mg by mouth every 6 (six) hours as needed for moderate pain. Pain   ALPRAZolam 1 MG tablet Commonly known as: XANAX Take 1 tablet (1 mg total) by mouth 3 (three) times daily.   aspirin EC 81 MG tablet Take 81 mg by mouth daily.   atorvastatin 40 MG tablet Commonly known as: LIPITOR TAKE ONE TABLET BY MOUTH ONCE DAILY.   buPROPion 300 MG 24 hr tablet Commonly known as: WELLBUTRIN XL Take 1 tablet (300 mg total) by mouth every morning.   carvedilol 12.5 MG tablet Commonly known as: COREG Take 1 tablet (12.5 mg total) by mouth 2 (two) times daily.   esomeprazole 20 MG capsule Commonly known as: NEXIUM Take 1 capsule (20 mg total) by mouth 2 (two) times daily before a meal.   glucose blood test strip Commonly known as: Accu-Chek Aviva Plus Test once qd. DX E11.9   magnesium oxide 400 MG tablet Commonly known as: MAG-OX Take 400 mg  by mouth daily.   metFORMIN 500 MG tablet Commonly known as: GLUCOPHAGE TAKE ONE TABLET BY MOUTH 2 TIMES A DAY WITH A MEAL. What changed:   how much to take  how to take this  when to take this  additional instructions   mycophenolate 500 MG tablet Commonly known as: CELLCEPT Take 500-1,000 mg by mouth See admin instructions. Take 500 mg by mouth in the morning and 1000 mg at night   Omega-3 1000 MG Caps Take 1,000 mg by mouth daily.   PEN NEEDLES 31GX5/16" 31G X 8 MM Misc 1 each by Does not apply route daily.   PROBIOTIC DAILY  PO Take 1 capsule by mouth daily.   ramipril 2.5 MG capsule Commonly known as: ALTACE Take 2.5 mg by mouth daily.   sildenafil 20 MG tablet Commonly known as: REVATIO Take 2-3 tablets (40-60 mg total) by mouth as needed.   tacrolimus 1 MG capsule Commonly known as: PROGRAF Take 0.5 mg by mouth 2 (two) times daily.   Viibryd 10 MG Tabs Generic drug: Vilazodone HCl Take 1 tablet (10 mg total) by mouth daily.        Objective:   BP 118/65   Pulse 84   Temp 98 F (36.7 C)   Ht '5\' 8"'  (1.727 m)   Wt 128 lb (58.1 kg)   SpO2 100%   BMI 19.46 kg/m   Wt Readings from Last 3 Encounters:  07/04/20 128 lb (58.1 kg)  03/30/20 138 lb (62.6 kg)  11/06/19 138 lb (62.6 kg)    Physical Exam Vitals and nursing note reviewed.  Constitutional:      General: He is not in acute distress.    Appearance: He is well-developed. He is not diaphoretic.  Eyes:     General: No scleral icterus.    Conjunctiva/sclera: Conjunctivae normal.  Neck:     Thyroid: No thyromegaly.  Cardiovascular:     Rate and Rhythm: Normal rate and regular rhythm.     Heart sounds: Normal heart sounds. No murmur heard.   Pulmonary:     Effort: Pulmonary effort is normal. No respiratory distress.     Breath sounds: Normal breath sounds. No wheezing.  Musculoskeletal:        General: Normal range of motion.     Cervical back: Neck supple.  Lymphadenopathy:     Cervical: No cervical adenopathy.  Skin:    General: Skin is warm and dry.     Findings: No rash.  Neurological:     Mental Status: He is alert and oriented to person, place, and time.     Coordination: Coordination normal.  Psychiatric:        Behavior: Behavior normal.       Assessment & Plan:   Problem List Items Addressed This Visit      Digestive   GERD (gastroesophageal reflux disease)     Endocrine   Diabetes type 2, controlled ([place]) - Primary   Relevant Orders   Bayer DCA Hb A1c Waived   TSH     Genitourinary   CKD  (chronic kidney disease), stage III     Other   Hypertriglyceridemia   Relevant Orders   Lipid panel      A1c looks good at 6.3, continue current medication, no changes. Follow up plan: Return in about 3 months (around 10/03/2020), or if symptoms worsen or fail to improve, for Diabetes and hypertension and cholesterol recheck.  Counseling provided for all of the vaccine components  Orders Placed This Encounter  Procedures  . Bayer DCA Hb A1c Waived  . TSH  . Lipid panel    Caryl Pina, MD [facility] 07/04/2020, 11:19 AM

## 2020-07-05 LAB — LIPID PANEL
Chol/HDL Ratio: 4.1 ratio (ref 0.0–5.0)
Cholesterol, Total: 122 mg/dL (ref 100–199)
HDL: 30 mg/dL — ABNORMAL LOW (ref >39)
LDL Chol Calc (NIH): 51 mg/dL (ref 0–99)
Triglycerides: 261 mg/dL — ABNORMAL HIGH (ref 0–149)
VLDL Cholesterol Cal: 41 mg/dL — ABNORMAL HIGH (ref 5–40)

## 2020-07-05 LAB — TSH: TSH: 1.03 u[IU]/mL (ref 0.450–4.500)

## 2020-07-11 DIAGNOSIS — Z941 Heart transplant status: Secondary | ICD-10-CM | POA: Diagnosis not present

## 2020-07-11 DIAGNOSIS — N189 Chronic kidney disease, unspecified: Secondary | ICD-10-CM | POA: Diagnosis not present

## 2020-07-11 DIAGNOSIS — I129 Hypertensive chronic kidney disease with stage 1 through stage 4 chronic kidney disease, or unspecified chronic kidney disease: Secondary | ICD-10-CM | POA: Diagnosis not present

## 2020-07-11 DIAGNOSIS — E1122 Type 2 diabetes mellitus with diabetic chronic kidney disease: Secondary | ICD-10-CM | POA: Diagnosis not present

## 2020-08-01 ENCOUNTER — Encounter (HOSPITAL_COMMUNITY): Payer: Self-pay | Admitting: Psychiatry

## 2020-08-01 ENCOUNTER — Other Ambulatory Visit: Payer: Self-pay

## 2020-08-01 ENCOUNTER — Telehealth (INDEPENDENT_AMBULATORY_CARE_PROVIDER_SITE_OTHER): Payer: Medicaid Other | Admitting: Psychiatry

## 2020-08-01 DIAGNOSIS — F321 Major depressive disorder, single episode, moderate: Secondary | ICD-10-CM | POA: Diagnosis not present

## 2020-08-01 MED ORDER — BUPROPION HCL ER (XL) 300 MG PO TB24: 300.0000 mg | ORAL_TABLET | Freq: Every morning | ORAL | 2 refills | Status: DC

## 2020-08-01 MED ORDER — VIIBRYD 10 MG PO TABS: 10.0000 mg | ORAL_TABLET | Freq: Every day | ORAL | 2 refills | Status: DC

## 2020-08-01 MED ORDER — ALPRAZOLAM 1 MG PO TABS: 1.0000 mg | ORAL_TABLET | Freq: Three times a day (TID) | ORAL | 2 refills | Status: DC
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
## 2020-09-06 DIAGNOSIS — Z48298 Encounter for aftercare following other organ transplant: Secondary | ICD-10-CM | POA: Diagnosis not present

## 2020-09-06 DIAGNOSIS — E785 Hyperlipidemia, unspecified: Secondary | ICD-10-CM | POA: Diagnosis not present

## 2020-09-06 DIAGNOSIS — Z79899 Other long term (current) drug therapy: Secondary | ICD-10-CM | POA: Diagnosis not present

## 2020-09-06 DIAGNOSIS — Z941 Heart transplant status: Secondary | ICD-10-CM | POA: Diagnosis not present

## 2020-09-06 DIAGNOSIS — R7989 Other specified abnormal findings of blood chemistry: Secondary | ICD-10-CM | POA: Diagnosis not present

## 2020-10-04 ENCOUNTER — Ambulatory Visit: Payer: Medicaid Other | Admitting: Family Medicine

## 2020-10-04 ENCOUNTER — Encounter: Payer: Self-pay | Admitting: Family Medicine

## 2020-10-04 ENCOUNTER — Other Ambulatory Visit: Payer: Self-pay

## 2020-10-04 VITALS — BP 161/90 | HR 90 | Ht 68.0 in | Wt 131.0 lb

## 2020-10-04 DIAGNOSIS — E1122 Type 2 diabetes mellitus with diabetic chronic kidney disease: Secondary | ICD-10-CM

## 2020-10-04 DIAGNOSIS — Z23 Encounter for immunization: Secondary | ICD-10-CM | POA: Diagnosis not present

## 2020-10-04 DIAGNOSIS — K219 Gastro-esophageal reflux disease without esophagitis: Secondary | ICD-10-CM

## 2020-10-04 DIAGNOSIS — N183 Chronic kidney disease, stage 3 unspecified: Secondary | ICD-10-CM

## 2020-10-04 DIAGNOSIS — E1159 Type 2 diabetes mellitus with other circulatory complications: Secondary | ICD-10-CM | POA: Diagnosis not present

## 2020-10-04 DIAGNOSIS — N1831 Chronic kidney disease, stage 3a: Secondary | ICD-10-CM | POA: Diagnosis not present

## 2020-10-04 DIAGNOSIS — I152 Hypertension secondary to endocrine disorders: Secondary | ICD-10-CM | POA: Diagnosis not present

## 2020-10-04 DIAGNOSIS — E781 Pure hyperglyceridemia: Secondary | ICD-10-CM

## 2020-10-04 LAB — CBC WITH DIFFERENTIAL/PLATELET
Basophils Absolute: 0.1 10*3/uL (ref 0.0–0.2)
Basos: 1 %
EOS (ABSOLUTE): 0.1 10*3/uL (ref 0.0–0.4)
Eos: 2 %
Hematocrit: 45.5 % (ref 37.5–51.0)
Hemoglobin: 15.4 g/dL (ref 13.0–17.7)
Immature Grans (Abs): 0 10*3/uL (ref 0.0–0.1)
Immature Granulocytes: 0 %
Lymphocytes Absolute: 2.4 10*3/uL (ref 0.7–3.1)
Lymphs: 31 %
MCH: 28.2 pg (ref 26.6–33.0)
MCHC: 33.8 g/dL (ref 31.5–35.7)
MCV: 83 fL (ref 79–97)
Monocytes Absolute: 0.7 10*3/uL (ref 0.1–0.9)
Monocytes: 10 %
Neutrophils Absolute: 4.3 10*3/uL (ref 1.4–7.0)
Neutrophils: 56 %
Platelets: 175 10*3/uL (ref 150–450)
RBC: 5.46 x10E6/uL (ref 4.14–5.80)
RDW: 12 % (ref 11.6–15.4)
WBC: 7.7 10*3/uL (ref 3.4–10.8)

## 2020-10-04 LAB — CMP14+EGFR
ALT: 10 IU/L (ref 0–44)
AST: 11 IU/L (ref 0–40)
Albumin/Globulin Ratio: 2 (ref 1.2–2.2)
Albumin: 4.7 g/dL (ref 3.8–4.8)
Alkaline Phosphatase: 68 IU/L (ref 44–121)
BUN/Creatinine Ratio: 11 (ref 10–24)
BUN: 16 mg/dL (ref 8–27)
Bilirubin Total: 0.4 mg/dL (ref 0.0–1.2)
CO2: 24 mmol/L (ref 20–29)
Calcium: 9.9 mg/dL (ref 8.6–10.2)
Chloride: 101 mmol/L (ref 96–106)
Creatinine, Ser: 1.44 mg/dL — ABNORMAL HIGH (ref 0.76–1.27)
GFR calc Af Amer: 60 mL/min/{1.73_m2} (ref >59)
GFR calc non Af Amer: 52 mL/min/{1.73_m2} — ABNORMAL LOW (ref >59)
Globulin, Total: 2.3 g/dL (ref 1.5–4.5)
Glucose: 111 mg/dL — ABNORMAL HIGH (ref 65–99)
Potassium: 4.9 mmol/L (ref 3.5–5.2)
Sodium: 141 mmol/L (ref 134–144)
Total Protein: 7 g/dL (ref 6.0–8.5)

## 2020-10-04 LAB — LIPID PANEL
Chol/HDL Ratio: 4.7 ratio (ref 0.0–5.0)
Cholesterol, Total: 132 mg/dL (ref 100–199)
HDL: 28 mg/dL — ABNORMAL LOW (ref >39)
LDL Chol Calc (NIH): 57 mg/dL (ref 0–99)
Triglycerides: 303 mg/dL — ABNORMAL HIGH (ref 0–149)
VLDL Cholesterol Cal: 47 mg/dL — ABNORMAL HIGH (ref 5–40)

## 2020-10-04 LAB — BAYER DCA HB A1C WAIVED: HB A1C (BAYER DCA - WAIVED): 6.3 % (ref <7.0)

## 2020-10-04 NOTE — Progress Notes (Signed)
BP (!) 173/90   Pulse 90   Ht 5' 8" (1.727 m)   Wt 131 lb (59.4 kg)   SpO2 100%   BMI 19.92 kg/m    Subjective:   Patient ID: Anthony Price, male    DOB: 1956/02/09, 64 y.o.   MRN: [id number]  HPI: Anthony Price is a 64 y.o. male presenting on 10/04/2020 for Medical Management of Chronic Issues, Diabetes, Hypertension, and Anxiety   HPI Type 2 diabetes mellitus Patient comes in today for recheck of his diabetes. Patient has been currently taking Metformin. Patient is currently on an ACE inhibitor/ARB. Patient has seen an ophthalmologist this year. Patient denies any issues with their feet. The symptom started onset as an adult hypertension and hypertriglyceridemia and GERD and CKD 3 ARE RELATED TO DM   Hypertension Patient is currently on carvedilol and ramipril, and their blood pressure today is 173/90 but he follows up with his cardiologist for his transplant and they are managing this.. Patient denies any lightheadedness or dizziness. Patient denies headaches, blurred vision, chest pains, shortness of breath, or weakness. Denies any side effects from medication and is content with current medication.   Hypertriglyceridemia Patient is coming in for recheck of his hyperlipidemia. The patient is currently taking fish oil and atorvastatin. They deny any issues with myalgias or history of liver damage from it. They deny any focal numbness or weakness or chest pain.   GERD Patient is currently on Nexium.  She denies any major symptoms or abdominal pain or belching or burping. She denies any blood in her stool or lightheadedness or dizziness.   Relevant past medical, surgical, family and social history reviewed and updated as indicated. Interim medical history since our last visit reviewed. Allergies and medications reviewed and updated.  Review of Systems  Constitutional: Negative for chills and fever.  Eyes: Negative for visual disturbance.  Respiratory: Negative for  shortness of breath and wheezing.   Cardiovascular: Negative for chest pain and leg swelling.  Musculoskeletal: Negative for back pain and gait problem.  Skin: Negative for rash.  Neurological: Negative for dizziness, weakness and light-headedness.  All other systems reviewed and are negative.   Per HPI unless specifically indicated above   Allergies as of 10/04/2020      Reactions   Lactose Intolerance (gi)    bloating   Tramadol Nausea Only      Medication List       Accurate as of October 04, 2020 11:13 AM. If you have any questions, ask your nurse or doctor.        Accu-Chek Aviva Plus w/Device Kit Test qd. DX E11.9   Accu-Chek Softclix Lancets lancets Test once qd. DX E11.9   acetaminophen 500 MG tablet Commonly known as: TYLENOL Take 1,000 mg by mouth every 6 (six) hours as needed for moderate pain. Pain   ALPRAZolam 1 MG tablet Commonly known as: XANAX Take 1 tablet (1 mg total) by mouth 3 (three) times daily.   aspirin EC 81 MG tablet Take 81 mg by mouth daily.   atorvastatin 40 MG tablet Commonly known as: LIPITOR TAKE ONE TABLET BY MOUTH ONCE DAILY.   buPROPion 300 MG 24 hr tablet Commonly known as: WELLBUTRIN XL Take 1 tablet (300 mg total) by mouth every morning.   carvedilol 12.5 MG tablet Commonly known as: COREG Take 1 tablet (12.5 mg total) by mouth 2 (two) times daily.   esomeprazole 20 MG capsule Commonly known as: NEXIUM Take 1 capsule (  20 mg total) by mouth 2 (two) times daily before a meal.   glucose blood test strip Commonly known as: Accu-Chek Aviva Plus Test once qd. DX E11.9   magnesium oxide 400 MG tablet Commonly known as: MAG-OX Take 400 mg by mouth daily.   metFORMIN 500 MG tablet Commonly known as: GLUCOPHAGE TAKE ONE TABLET BY MOUTH 2 TIMES A DAY WITH A MEAL. What changed:   how much to take  how to take this  when to take this  additional instructions   mycophenolate 500 MG tablet Commonly known as:  CELLCEPT Take 500-1,000 mg by mouth See admin instructions. Take 500 mg by mouth in the morning and 1000 mg at night   Omega-3 1000 MG Caps Take 1,000 mg by mouth daily.   PEN NEEDLES 31GX5/16" 31G X 8 MM Misc 1 each by Does not apply route daily.   PROBIOTIC DAILY PO Take 1 capsule by mouth daily.   ramipril 2.5 MG capsule Commonly known as: ALTACE Take 2.5 mg by mouth daily.   sildenafil 20 MG tablet Commonly known as: REVATIO Take 2-3 tablets (40-60 mg total) by mouth as needed.   tacrolimus 1 MG capsule Commonly known as: PROGRAF Take 0.5 mg by mouth 2 (two) times daily.   Viibryd 10 MG Tabs Generic drug: Vilazodone HCl Take 1 tablet (10 mg total) by mouth daily.        Objective:   BP (!) 173/90   Pulse 90   Ht 5' 8" (1.727 m)   Wt 131 lb (59.4 kg)   SpO2 100%   BMI 19.92 kg/m   Wt Readings from Last 3 Encounters:  10/04/20 131 lb (59.4 kg)  07/04/20 128 lb (58.1 kg)  03/30/20 138 lb (62.6 kg)    Physical Exam Vitals and nursing note reviewed.  Constitutional:      General: He is not in acute distress.    Appearance: He is well-developed and well-nourished. He is not diaphoretic.  Eyes:     General: No scleral icterus.    Extraocular Movements: EOM normal.     Conjunctiva/sclera: Conjunctivae normal.  Neck:     Thyroid: No thyromegaly.  Cardiovascular:     Rate and Rhythm: Normal rate and regular rhythm.     Pulses: Intact distal pulses.     Heart sounds: Normal heart sounds. No murmur heard.   Pulmonary:     Effort: Pulmonary effort is normal. No respiratory distress.     Breath sounds: Normal breath sounds. No wheezing.  Musculoskeletal:        General: No edema. Normal range of motion.     Cervical back: Neck supple.  Lymphadenopathy:     Cervical: No cervical adenopathy.  Skin:    General: Skin is warm and dry.     Findings: No rash.  Neurological:     Mental Status: He is alert and oriented to person, place, and time.      Coordination: Coordination normal.  Psychiatric:        Mood and Affect: Mood and affect normal.        Behavior: Behavior normal.     Results for orders placed or performed in visit on 07/04/20  Bayer DCA Hb A1c Waived  Result Value Ref Range   HB A1C (BAYER DCA - WAIVED) 6.3 <7.0 %  TSH  Result Value Ref Range   TSH 1.030 0.450 - 4.500 uIU/mL  Lipid panel  Result Value Ref Range   Cholesterol, Total 122  100 - 199 mg/dL   Triglycerides 261 (H) 0 - 149 mg/dL   HDL 30 (L) >39 mg/dL   VLDL Cholesterol Cal 41 (H) 5 - 40 mg/dL   LDL Chol Calc (NIH) 51 0 - 99 mg/dL   Chol/HDL Ratio 4.1 0.0 - 5.0 ratio    Assessment & Plan:   Problem List Items Addressed This Visit      Cardiovascular and Mediastinum   Hypertension associated with diabetes ([name])   Relevant Orders   CMP14+EGFR     Digestive   GERD (gastroesophageal reflux disease)   Relevant Orders   CBC with Differential/Platelet     Endocrine   Diabetes type 2, controlled ([place]) - Primary   Relevant Orders   Bayer DCA Hb A1c Waived   Lipid panel     Genitourinary   CKD (chronic kidney disease), stage III ([place])   Relevant Orders   CMP14+EGFR     Other   Hypertriglyceridemia   Relevant Orders   Lipid panel      Will check blood work, A1c looks good, no change in medication, follow-up with blood pressure with his cardiology.  And transplant team. Follow up plan: Return in about 3 months (around 01/02/2021), or if symptoms worsen or fail to improve, for Diabetes and hypertension and cholesterol.  Counseling provided for all of the vaccine components Orders Placed This Encounter  Procedures  . [facility] Hb A1c [place], MD [facility] 10/04/2020, 11:13 AM

## 2020-10-04 NOTE — Addendum Note (Signed)
Addended by: Alphonzo Dublin on: 10/04/2020 03:43 PM   Modules accepted: Orders

## 2020-10-11 IMAGING — US ULTRASOUND AORTA
1 series · 14 of 25 positions shown · non-contrast
Comparison: None

CLINICAL DATA: Abdominal bruit, prior heart transplant

EXAM:
ULTRASOUND OF ABDOMINAL AORTA
TECHNIQUE: Ultrasound examination of the abdominal aorta and proximal common
iliac arteries was performed to evaluate for aneurysm. Additional
color and Doppler images of the distal aorta were obtained to
document patency.

[Series 1: ultrasound aorta · 0.25mm/px · 14 of 28 slices shown]
[im 1/28]
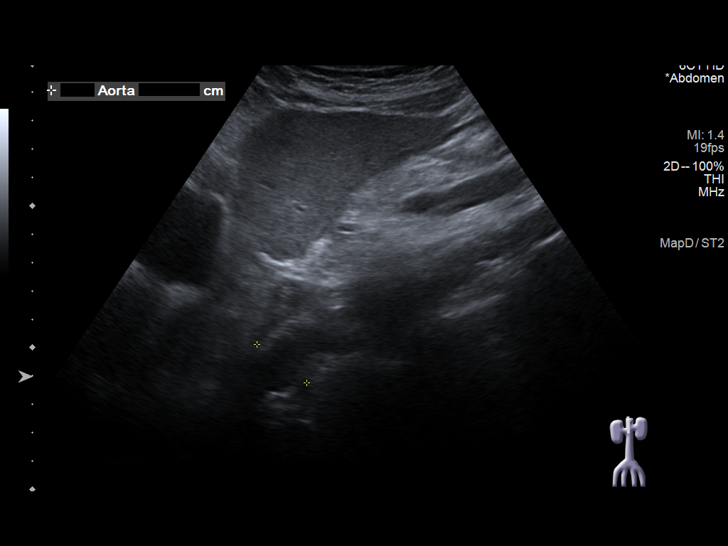
[im 3/28]
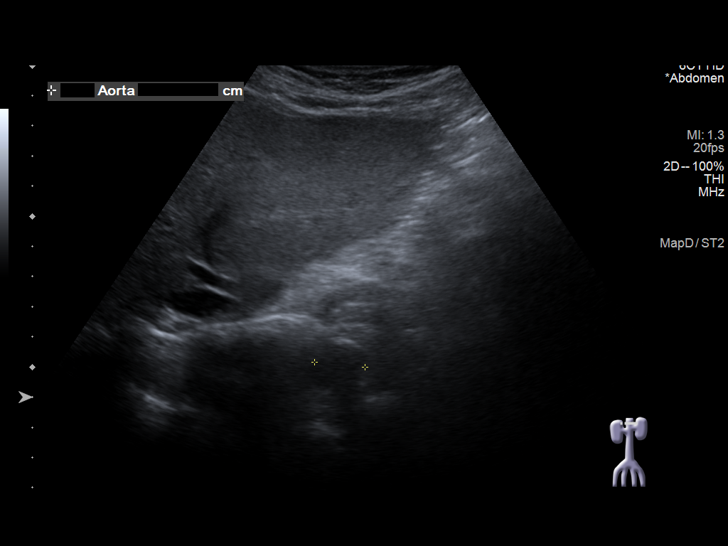
[im 5/28]
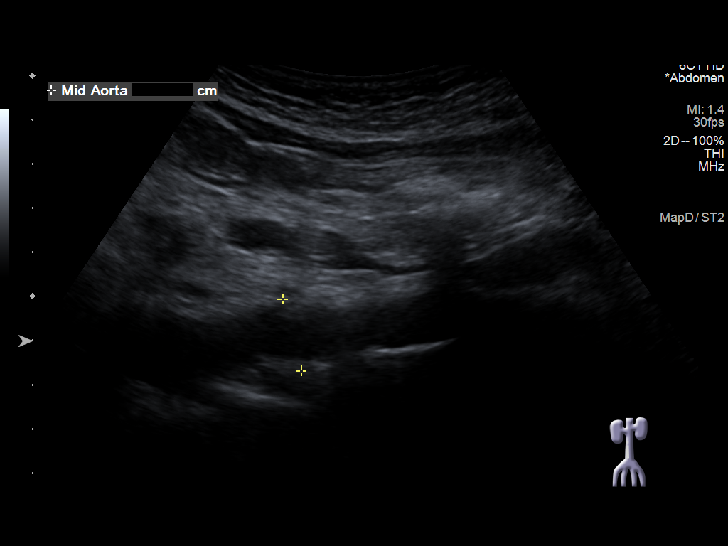
[im 7/28]
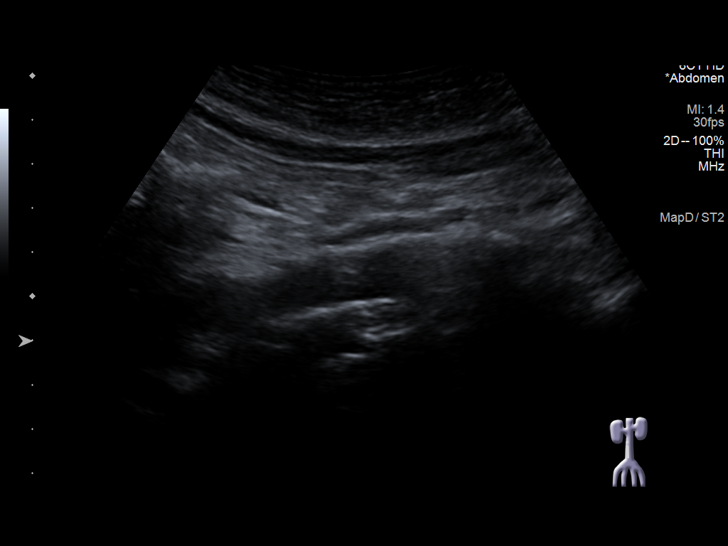
[im 10/28]
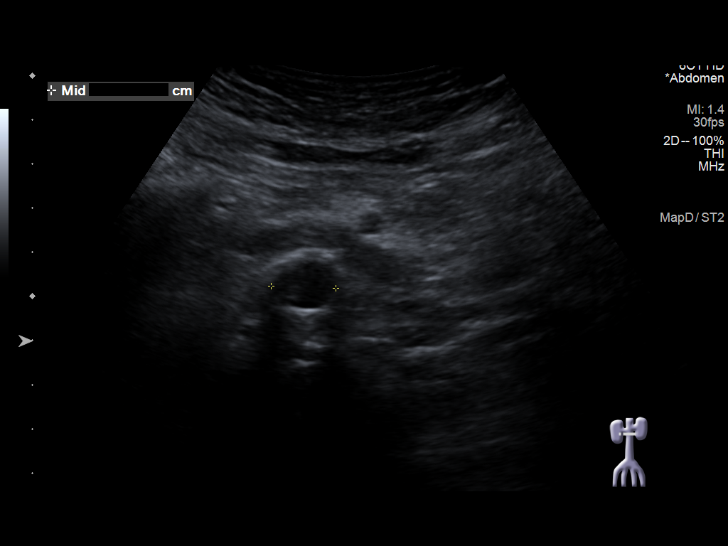
[im 11/28]
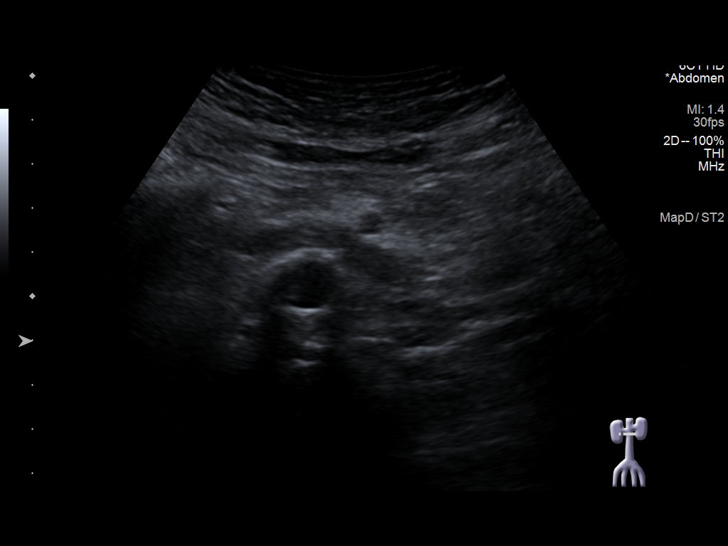
[im 13/28]
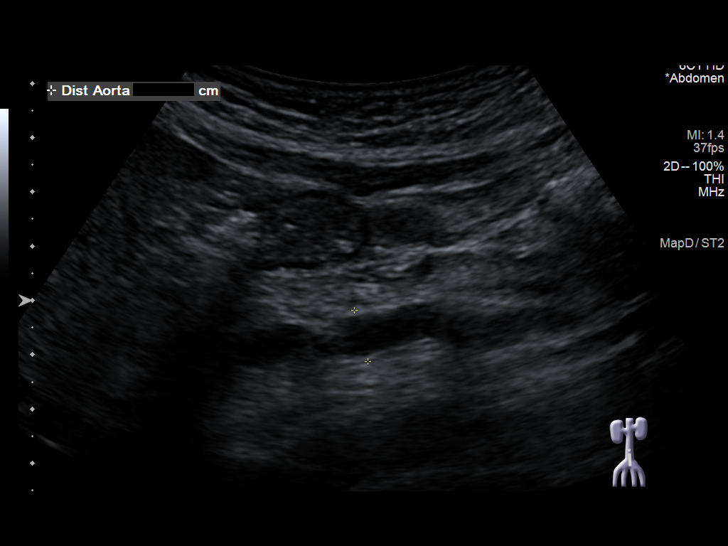
[im 15/28]
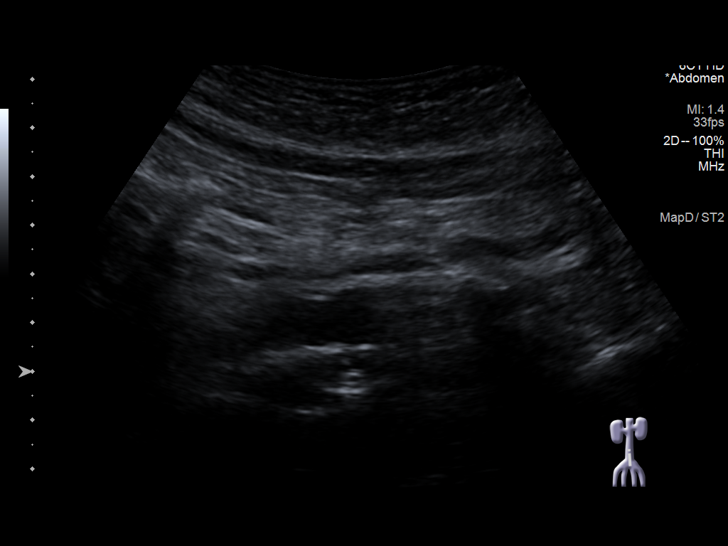
[im 17/28]
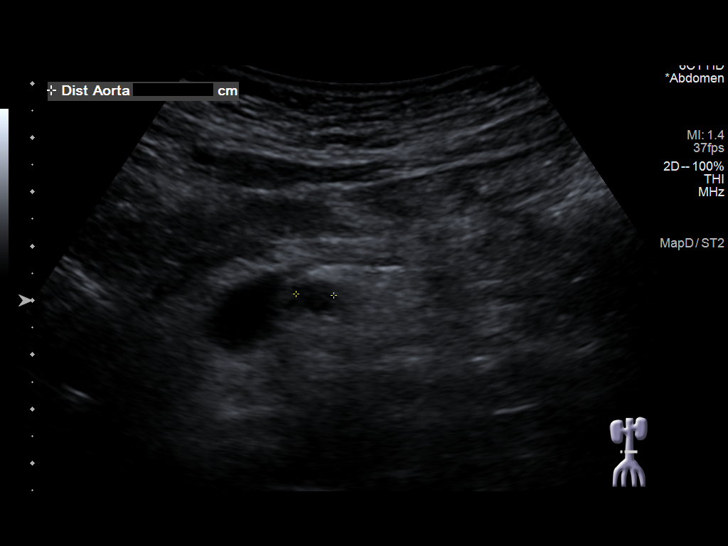
[im 19/28]
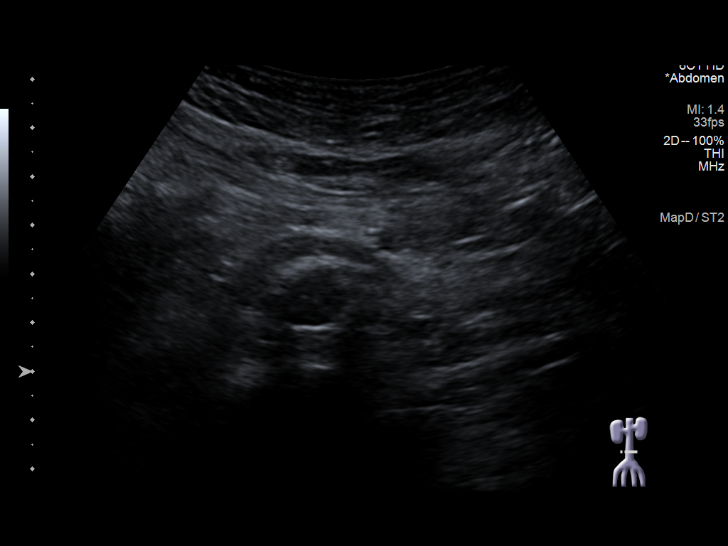
[im 21/28]
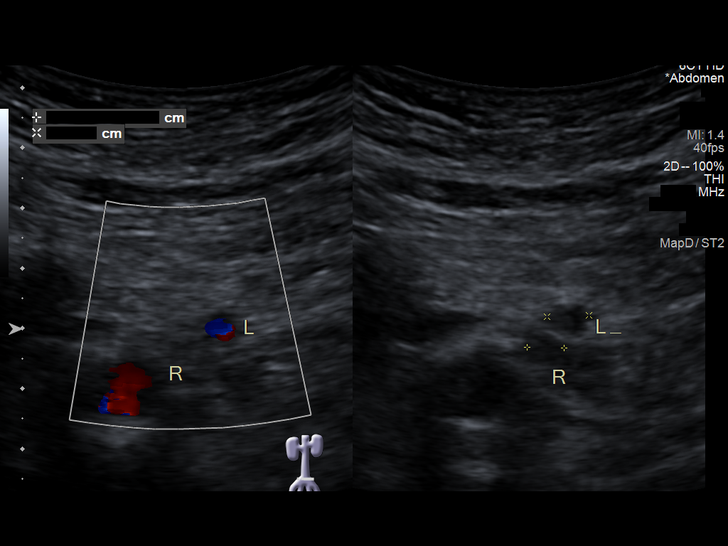
[im 23/28]
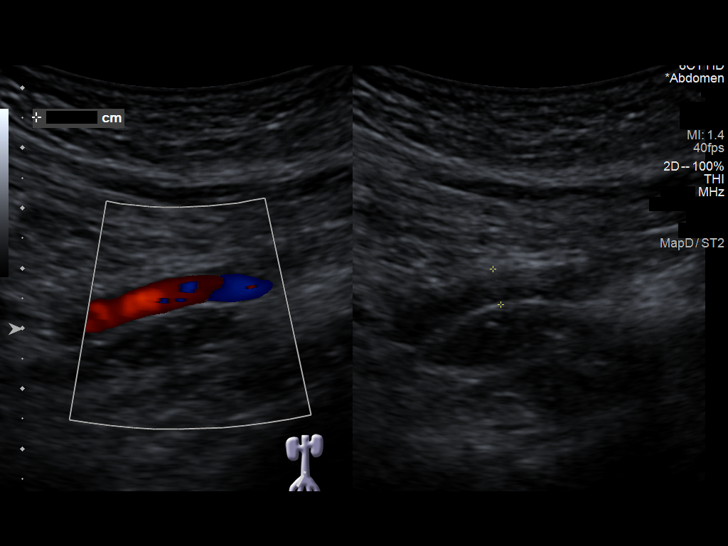
[im 25/28]
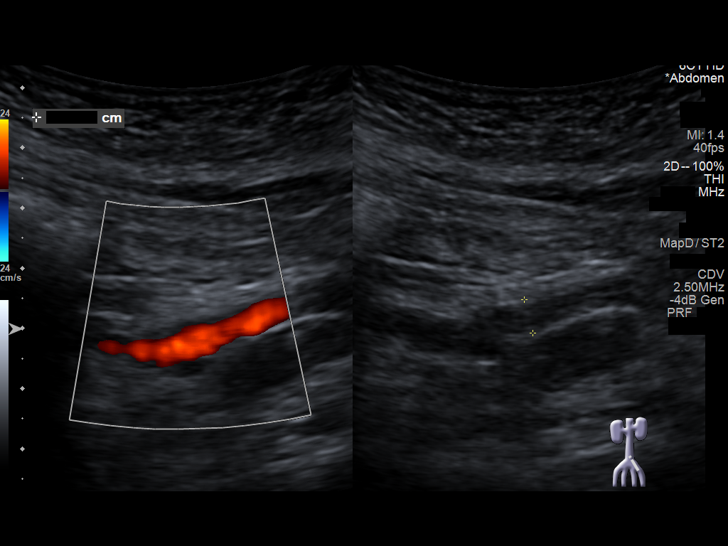
[im 28/28]
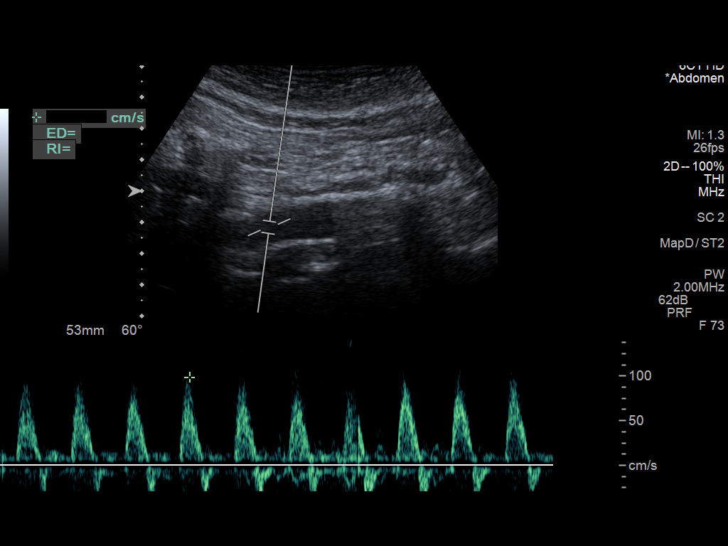

[14 of 25 positions shown; findings below may reference images not displayed]

FINDINGS: Abdominal aortic measurements as follows:

Proximal:  2.2 x 1.7 cm

Mid:  1.7 x 1.6 cm

Distal:  1.4 x 1.5 cm
Patent: Yes, peak systolic velocity is 99 cm/s

Right common iliac artery: 0.6 x 0.6 cm

Left common iliac artery: 0.6 x 0.7 cm
IMPRESSION: Normal caliber abdominal aorta.

## 2020-10-29 ENCOUNTER — Other Ambulatory Visit (HOSPITAL_COMMUNITY): Payer: Self-pay | Admitting: Psychiatry

## 2020-10-31 ENCOUNTER — Telehealth (INDEPENDENT_AMBULATORY_CARE_PROVIDER_SITE_OTHER): Payer: Medicaid Other | Admitting: Psychiatry

## 2020-10-31 ENCOUNTER — Encounter (HOSPITAL_COMMUNITY): Payer: Self-pay | Admitting: Psychiatry

## 2020-10-31 ENCOUNTER — Other Ambulatory Visit: Payer: Self-pay

## 2020-10-31 DIAGNOSIS — F321 Major depressive disorder, single episode, moderate: Secondary | ICD-10-CM | POA: Diagnosis not present

## 2020-10-31 MED ORDER — VIIBRYD 10 MG PO TABS: 10.0000 mg | ORAL_TABLET | Freq: Every day | ORAL | 2 refills | Status: DC

## 2020-10-31 MED ORDER — BUPROPION HCL ER (XL) 300 MG PO TB24: 300.0000 mg | ORAL_TABLET | Freq: Every morning | ORAL | 2 refills | Status: DC

## 2020-10-31 MED ORDER — ALPRAZOLAM 1 MG PO TABS: ORAL_TABLET | ORAL | 2 refills | Status: DC
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
## 2020-12-10 ENCOUNTER — Encounter (HOSPITAL_COMMUNITY): Payer: Self-pay | Admitting: *Deleted

## 2020-12-10 ENCOUNTER — Emergency Department (HOSPITAL_COMMUNITY)
Admission: EM | Admit: 2020-12-10 | Discharge: 2020-12-10 | Disposition: A | Discharge status: Home / Self Care | Payer: Medicaid Other | Attending: Emergency Medicine | Admitting: Emergency Medicine

## 2020-12-10 ENCOUNTER — Emergency Department (HOSPITAL_COMMUNITY): Payer: Medicaid Other

## 2020-12-10 ENCOUNTER — Other Ambulatory Visit: Payer: Self-pay

## 2020-12-10 DIAGNOSIS — Z7982 Long term (current) use of aspirin: Secondary | ICD-10-CM | POA: Diagnosis not present

## 2020-12-10 DIAGNOSIS — Z7984 Long term (current) use of oral hypoglycemic drugs: Secondary | ICD-10-CM | POA: Insufficient documentation

## 2020-12-10 DIAGNOSIS — M546 Pain in thoracic spine: Secondary | ICD-10-CM | POA: Diagnosis not present

## 2020-12-10 DIAGNOSIS — I509 Heart failure, unspecified: Secondary | ICD-10-CM | POA: Insufficient documentation

## 2020-12-10 DIAGNOSIS — R0789 Other chest pain: Secondary | ICD-10-CM | POA: Insufficient documentation

## 2020-12-10 DIAGNOSIS — I10 Essential (primary) hypertension: Secondary | ICD-10-CM

## 2020-12-10 DIAGNOSIS — I130 Hypertensive heart and chronic kidney disease with heart failure and stage 1 through stage 4 chronic kidney disease, or unspecified chronic kidney disease: Secondary | ICD-10-CM | POA: Diagnosis not present

## 2020-12-10 DIAGNOSIS — Z87891 Personal history of nicotine dependence: Secondary | ICD-10-CM | POA: Diagnosis not present

## 2020-12-10 DIAGNOSIS — Z794 Long term (current) use of insulin: Secondary | ICD-10-CM | POA: Diagnosis not present

## 2020-12-10 DIAGNOSIS — Z79899 Other long term (current) drug therapy: Secondary | ICD-10-CM | POA: Insufficient documentation

## 2020-12-10 DIAGNOSIS — Z941 Heart transplant status: Secondary | ICD-10-CM | POA: Diagnosis not present

## 2020-12-10 DIAGNOSIS — R079 Chest pain, unspecified: Secondary | ICD-10-CM | POA: Diagnosis not present

## 2020-12-10 DIAGNOSIS — E1122 Type 2 diabetes mellitus with diabetic chronic kidney disease: Secondary | ICD-10-CM | POA: Diagnosis not present

## 2020-12-10 DIAGNOSIS — F419 Anxiety disorder, unspecified: Secondary | ICD-10-CM | POA: Insufficient documentation

## 2020-12-10 DIAGNOSIS — N183 Chronic kidney disease, stage 3 unspecified: Secondary | ICD-10-CM | POA: Insufficient documentation

## 2020-12-10 LAB — HEPATIC FUNCTION PANEL
ALT: 14 U/L (ref 0–44)
AST: 17 U/L (ref 15–41)
Albumin: 4.4 g/dL (ref 3.5–5.0)
Alkaline Phosphatase: 58 U/L (ref 38–126)
Bilirubin, Direct: 0.1 mg/dL (ref 0.0–0.2)
Indirect Bilirubin: 0.7 mg/dL (ref 0.3–0.9)
Total Bilirubin: 0.8 mg/dL (ref 0.3–1.2)
Total Protein: 7.6 g/dL (ref 6.5–8.1)

## 2020-12-10 LAB — BASIC METABOLIC PANEL
Anion gap: 10 (ref 5–15)
BUN: 17 mg/dL (ref 8–23)
CO2: 28 mmol/L (ref 22–32)
Calcium: 9.8 mg/dL (ref 8.9–10.3)
Chloride: 100 mmol/L (ref 98–111)
Creatinine, Ser: 1.44 mg/dL — ABNORMAL HIGH (ref 0.61–1.24)
GFR, Estimated: 55 mL/min — ABNORMAL LOW (ref >60)
Glucose, Bld: 135 mg/dL — ABNORMAL HIGH (ref 70–99)
Potassium: 4.7 mmol/L (ref 3.5–5.1)
Sodium: 138 mmol/L (ref 135–145)

## 2020-12-10 LAB — LIPASE, BLOOD: Lipase: 59 U/L — ABNORMAL HIGH (ref 11–51)

## 2020-12-10 LAB — CBC
HCT: 48.1 % (ref 39.0–52.0)
Hemoglobin: 14.9 g/dL (ref 13.0–17.0)
MCH: 27.1 pg (ref 26.0–34.0)
MCHC: 31 g/dL (ref 30.0–36.0)
MCV: 87.5 fL (ref 80.0–100.0)
Platelets: 222 10*3/uL (ref 150–400)
RBC: 5.5 MIL/uL (ref 4.22–5.81)
RDW: 13.1 % (ref 11.5–15.5)
WBC: 11.2 10*3/uL — ABNORMAL HIGH (ref 4.0–10.5)
nRBC: 0 % (ref 0.0–0.2)

## 2020-12-10 LAB — TROPONIN I (HIGH SENSITIVITY)
Troponin I (High Sensitivity): 4 ng/L (ref <18)
Troponin I (High Sensitivity): 4 ng/L (ref <18)

## 2020-12-10 NOTE — Discharge Instructions (Addendum)
Be sure to contact your cardiologist tomorrow to arrange a follow-up appointment.  Your blood pressure was elevated this evening, be sure to take your blood pressure medication when you return home.  You may also follow-up with your primary doctor for recheck.  Return emergency department for any worsening symptoms.
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
## 2020-12-10 NOTE — ED Triage Notes (Signed)
Concerned he may be having a heart attack, states sh was advised the next time he had a heart attack he would not have chest pain

## 2020-12-10 NOTE — ED Triage Notes (Signed)
States he may be having an anxiety attack, history of same

## 2020-12-19 DIAGNOSIS — Z941 Heart transplant status: Secondary | ICD-10-CM | POA: Diagnosis not present

## 2020-12-19 DIAGNOSIS — R7989 Other specified abnormal findings of blood chemistry: Secondary | ICD-10-CM | POA: Diagnosis not present

## 2020-12-19 DIAGNOSIS — E559 Vitamin D deficiency, unspecified: Secondary | ICD-10-CM | POA: Diagnosis not present

## 2020-12-19 DIAGNOSIS — Z79899 Other long term (current) drug therapy: Secondary | ICD-10-CM | POA: Diagnosis not present

## 2020-12-19 DIAGNOSIS — Z48298 Encounter for aftercare following other organ transplant: Secondary | ICD-10-CM | POA: Diagnosis not present

## 2021-01-03 ENCOUNTER — Encounter: Payer: Self-pay | Admitting: Family Medicine

## 2021-01-03 ENCOUNTER — Ambulatory Visit: Payer: Medicaid Other | Admitting: Family Medicine

## 2021-01-03 ENCOUNTER — Other Ambulatory Visit: Payer: Self-pay

## 2021-01-03 VITALS — BP 159/76 | HR 89 | Ht 68.0 in | Wt 136.0 lb

## 2021-01-03 DIAGNOSIS — N183 Chronic kidney disease, stage 3 unspecified: Secondary | ICD-10-CM | POA: Diagnosis not present

## 2021-01-03 DIAGNOSIS — I152 Hypertension secondary to endocrine disorders: Secondary | ICD-10-CM

## 2021-01-03 DIAGNOSIS — N1831 Chronic kidney disease, stage 3a: Secondary | ICD-10-CM | POA: Diagnosis not present

## 2021-01-03 DIAGNOSIS — E1159 Type 2 diabetes mellitus with other circulatory complications: Secondary | ICD-10-CM

## 2021-01-03 DIAGNOSIS — I10 Essential (primary) hypertension: Secondary | ICD-10-CM | POA: Diagnosis not present

## 2021-01-03 DIAGNOSIS — K219 Gastro-esophageal reflux disease without esophagitis: Secondary | ICD-10-CM

## 2021-01-03 DIAGNOSIS — E781 Pure hyperglyceridemia: Secondary | ICD-10-CM

## 2021-01-03 DIAGNOSIS — E1122 Type 2 diabetes mellitus with diabetic chronic kidney disease: Secondary | ICD-10-CM | POA: Diagnosis not present

## 2021-01-03 DIAGNOSIS — N182 Chronic kidney disease, stage 2 (mild): Secondary | ICD-10-CM | POA: Diagnosis not present

## 2021-01-03 LAB — CMP14+EGFR
ALT: 12 IU/L (ref 0–44)
AST: 10 IU/L (ref 0–40)
Albumin/Globulin Ratio: 2.2 (ref 1.2–2.2)
Albumin: 4.6 g/dL (ref 3.8–4.8)
Alkaline Phosphatase: 72 IU/L (ref 44–121)
BUN/Creatinine Ratio: 13 (ref 10–24)
BUN: 22 mg/dL (ref 8–27)
Bilirubin Total: 0.5 mg/dL (ref 0.0–1.2)
CO2: 21 mmol/L (ref 20–29)
Calcium: 9.7 mg/dL (ref 8.6–10.2)
Chloride: 101 mmol/L (ref 96–106)
Creatinine, Ser: 1.64 mg/dL — ABNORMAL HIGH (ref 0.76–1.27)
Globulin, Total: 2.1 g/dL (ref 1.5–4.5)
Glucose: 236 mg/dL — ABNORMAL HIGH (ref 65–99)
Potassium: 5.2 mmol/L (ref 3.5–5.2)
Sodium: 138 mmol/L (ref 134–144)
Total Protein: 6.7 g/dL (ref 6.0–8.5)
eGFR: 47 mL/min/{1.73_m2} — ABNORMAL LOW (ref >59)

## 2021-01-03 LAB — CBC WITH DIFFERENTIAL/PLATELET
Basophils Absolute: 0.1 10*3/uL (ref 0.0–0.2)
Basos: 1 %
EOS (ABSOLUTE): 0.1 10*3/uL (ref 0.0–0.4)
Eos: 2 %
Hematocrit: 44.2 % (ref 37.5–51.0)
Hemoglobin: 14.3 g/dL (ref 13.0–17.7)
Immature Grans (Abs): 0 10*3/uL (ref 0.0–0.1)
Immature Granulocytes: 0 %
Lymphocytes Absolute: 2 10*3/uL (ref 0.7–3.1)
Lymphs: 31 %
MCH: 26.3 pg — ABNORMAL LOW (ref 26.6–33.0)
MCHC: 32.4 g/dL (ref 31.5–35.7)
MCV: 81 fL (ref 79–97)
Monocytes Absolute: 0.6 10*3/uL (ref 0.1–0.9)
Monocytes: 9 %
Neutrophils Absolute: 3.7 10*3/uL (ref 1.4–7.0)
Neutrophils: 57 %
Platelets: 142 10*3/uL — ABNORMAL LOW (ref 150–450)
RBC: 5.44 x10E6/uL (ref 4.14–5.80)
RDW: 11.9 % (ref 11.6–15.4)
WBC: 6.6 10*3/uL (ref 3.4–10.8)

## 2021-01-03 LAB — LIPID PANEL
Chol/HDL Ratio: 4.1 ratio (ref 0.0–5.0)
Cholesterol, Total: 120 mg/dL (ref 100–199)
HDL: 29 mg/dL — ABNORMAL LOW (ref >39)
LDL Chol Calc (NIH): 45 mg/dL (ref 0–99)
Triglycerides: 302 mg/dL — ABNORMAL HIGH (ref 0–149)
VLDL Cholesterol Cal: 46 mg/dL — ABNORMAL HIGH (ref 5–40)

## 2021-01-03 LAB — BAYER DCA HB A1C WAIVED: HB A1C (BAYER DCA - WAIVED): 6.8 % (ref <7.0)

## 2021-01-03 MED ORDER — METFORMIN HCL 500 MG PO TABS: 500.0000 mg | ORAL_TABLET | Freq: Two times a day (BID) | ORAL | 11 refills | Status: DC

## 2021-01-03 MED ORDER — ATORVASTATIN CALCIUM 40 MG PO TABS: 40.0000 mg | ORAL_TABLET | Freq: Every day | ORAL | 11 refills | Status: DC

## 2021-01-03 MED ORDER — CARVEDILOL 12.5 MG PO TABS
12.5000 mg | ORAL_TABLET | Freq: Two times a day (BID) | ORAL | 11 refills | Status: DC
Start: 2021-01-03 — End: 2022-01-08

## 2021-01-03 MED ORDER — ESOMEPRAZOLE MAGNESIUM 20 MG PO CPDR: 20.0000 mg | DELAYED_RELEASE_CAPSULE | Freq: Two times a day (BID) | ORAL | 11 refills | Status: DC

## 2021-01-03 MED ORDER — AMLODIPINE BESYLATE 5 MG PO TABS
5.0000 mg | ORAL_TABLET | Freq: Every day | ORAL | 11 refills | Status: DC
Start: 2021-01-03 — End: 2021-04-05

## 2021-01-03 NOTE — Progress Notes (Signed)
BP (!) 159/76   Pulse 89   Ht '5\' 8"'  (1.727 m)   Wt 136 lb (61.7 kg)   SpO2 100%   BMI 20.68 kg/m    Subjective:   Patient ID: Anthony Price, male    DOB: 03/05/1956, 65 y.o.   MRN: [id number]  HPI: Anthony Price is a 65 y.o. male presenting on 01/03/2021 for Medical Management of Chronic Issues, Diabetes, and Hypertension   HPI Type 2 diabetes mellitus Patient comes in today for recheck of his diabetes. Patient has been currently taking Metformin. Patient is currently on an ACE inhibitor/ARB. Patient has not seen an ophthalmologist this year. Patient denies any issues with their feet. The symptom started onset as an adult hypertension hyperlipidemia ARE RELATED TO DM  Hypertension Patient is currently on ramipril and carvedilol, and their blood pressure today is 139/76. Patient denies any lightheadedness or dizziness. Patient denies headaches, blurred vision, chest pains, shortness of breath, or weakness. Denies any side effects from medication and is content with current medication.  Patient says had some episodes when his blood pressure running up where he will feel little bit of pain under his shoulder blade in the back, he denies any shortness of breath or chest pain in the front.  Patient has a history of a heart transplant.  He denies any of the symptoms today  Hyperlipidemia and hypertriglyceridemia Patient is coming in for recheck of his hyperlipidemia. The patient is currently taking atorvastatin. They deny any issues with myalgias or history of liver damage from it. They deny any focal numbness or weakness or chest pain.   GERD Patient is currently on Nexium.  She denies any major symptoms or abdominal pain or belching or burping. She denies any blood in her stool or lightheadedness or dizziness.   Relevant past medical, surgical, family and social history reviewed and updated as indicated. Interim medical history since our last visit reviewed. Allergies and  medications reviewed and updated.  Review of Systems  Constitutional: Negative for chills and fever.  Eyes: Negative for visual disturbance.  Respiratory: Negative for shortness of breath and wheezing.   Cardiovascular: Negative for chest pain and leg swelling.  Musculoskeletal: Negative for back pain and gait problem.  Skin: Negative for rash.  Neurological: Negative for dizziness, weakness and numbness.  All other systems reviewed and are negative.   Per HPI unless specifically indicated above   Allergies as of 01/03/2021      Reactions   Lactose Intolerance (gi)    bloating   Tramadol Nausea Only      Medication List       Accurate as of January 03, 2021 11:02 AM. If you have any questions, ask your nurse or doctor.        Accu-Chek Aviva Plus w/Device Kit Test qd. DX E11.9   Accu-Chek Softclix Lancets lancets Test once qd. DX E11.9   acetaminophen 500 MG tablet Commonly known as: TYLENOL Take 1,000 mg by mouth every 6 (six) hours as needed for moderate pain. Pain   ALPRAZolam 1 MG tablet Commonly known as: XANAX TAKE (1) TABLET BY MOUTH THREE TIMES DAILY.   aspirin EC 81 MG tablet Take 81 mg by mouth daily.   atorvastatin 40 MG tablet Commonly known as: LIPITOR TAKE ONE TABLET BY MOUTH ONCE DAILY.   buPROPion 300 MG 24 hr tablet Commonly known as: WELLBUTRIN XL Take 1 tablet (300 mg total) by mouth every morning.   carvedilol 12.5 MG tablet Commonly  known as: COREG Take 1 tablet (12.5 mg total) by mouth 2 (two) times daily.   esomeprazole 20 MG capsule Commonly known as: NEXIUM Take 1 capsule (20 mg total) by mouth 2 (two) times daily before a meal.   glucose blood test strip Commonly known as: Accu-Chek Aviva Plus Test once qd. DX E11.9   magnesium oxide 400 MG tablet Commonly known as: MAG-OX Take 400 mg by mouth daily.   metFORMIN 500 MG tablet Commonly known as: GLUCOPHAGE TAKE ONE TABLET BY MOUTH 2 TIMES A DAY WITH A MEAL. What changed:    how much to take  how to take this  when to take this  additional instructions   mycophenolate 500 MG tablet Commonly known as: CELLCEPT Take 500-1,000 mg by mouth See admin instructions. Take 500 mg by mouth in the morning and 1000 mg at night   Omega-3 1000 MG Caps Take 1,000 mg by mouth in the morning and at bedtime.   PEN NEEDLES 31GX5/16" 31G X 8 MM Misc 1 each by Does not apply route daily.   PROBIOTIC DAILY PO Take 1 capsule by mouth daily.   ramipril 2.5 MG capsule Commonly known as: ALTACE Take 2.5 mg by mouth daily.   sildenafil 20 MG tablet Commonly known as: REVATIO Take 2-3 tablets (40-60 mg total) by mouth as needed.   tacrolimus 0.5 MG capsule Commonly known as: PROGRAF Take 0.5 mg by mouth 2 (two) times daily.   Viibryd 10 MG Tabs Generic drug: Vilazodone HCl Take 1 tablet (10 mg total) by mouth daily.        Objective:   BP (!) 159/76   Pulse 89   Ht '5\' 8"'  (1.727 m)   Wt 136 lb (61.7 kg)   SpO2 100%   BMI 20.68 kg/m   Wt Readings from Last 3 Encounters:  01/03/21 136 lb (61.7 kg)  10/04/20 131 lb (59.4 kg)  07/04/20 128 lb (58.1 kg)    Physical Exam Vitals and nursing note reviewed.  Constitutional:      General: He is not in acute distress.    Appearance: He is well-developed. He is not diaphoretic.  Eyes:     General: No scleral icterus.    Conjunctiva/sclera: Conjunctivae normal.  Neck:     Thyroid: No thyromegaly.  Cardiovascular:     Rate and Rhythm: Normal rate and regular rhythm.     Heart sounds: Normal heart sounds. No murmur heard.   Pulmonary:     Effort: Pulmonary effort is normal. No respiratory distress.     Breath sounds: Normal breath sounds. No wheezing.  Musculoskeletal:        General: Normal range of motion.     Cervical back: Neck supple.  Lymphadenopathy:     Cervical: No cervical adenopathy.  Skin:    General: Skin is warm and dry.     Findings: No rash.  Neurological:     Mental Status: He  is alert and oriented to person, place, and time.     Coordination: Coordination normal.  Psychiatric:        Behavior: Behavior normal.     A1c 6.8  Assessment & Plan:   Problem List Items Addressed This Visit      Cardiovascular and Mediastinum   Hypertension associated with diabetes ([place])   Relevant Medications   metFORMIN (GLUCOPHAGE) 500 MG tablet   carvedilol (COREG) 12.5 MG tablet   atorvastatin (LIPITOR) 40 MG tablet   amLODipine (NORVASC) 5 MG tablet  Other Relevant Orders   CBC with Differential/Platelet   CMP14+EGFR   Lipid panel   Bayer DCA Hb A1c Waived     Digestive   GERD (gastroesophageal reflux disease)   Relevant Medications   esomeprazole (NEXIUM) 20 MG capsule     Endocrine   Diabetes type 2, controlled ([place]) - Primary   Relevant Medications   metFORMIN (GLUCOPHAGE) 500 MG tablet   atorvastatin (LIPITOR) 40 MG tablet   Other Relevant Orders   CBC with Differential/Platelet   CMP14+EGFR   Lipid panel   Bayer DCA Hb A1c Waived     Genitourinary   CKD (chronic kidney disease), stage III ([facility])   Relevant Orders   CBC with Differential/Platelet   CMP14+EGFR   Lipid panel   Bayer DCA Hb A1c Waived     Other   Hypertriglyceridemia   Relevant Medications   carvedilol (COREG) 12.5 MG tablet   atorvastatin (LIPITOR) 40 MG tablet   amLODipine (NORVASC) 5 MG tablet    Other Visit Diagnoses    Essential hypertension       Relevant Medications   carvedilol (COREG) 12.5 MG tablet   atorvastatin (LIPITOR) 40 MG tablet   amLODipine (NORVASC) 5 MG tablet      Patient prefers a 30-day supply because he gets confused and dry to be able to fill them all every month.  Blood pressure running up here and at home, will start low-dose amlodipine to see if it helps him. Follow up plan: Return in about 3 months (around 04/05/2021), or if symptoms worsen or fail to improve, for Diabetes and hypertension recheck.  Counseling provided for all of the vaccine  components Orders Placed This Encounter  Procedures  . CBC with Differential/Platelet  . CMP14+EGFR  . Lipid panel  . Bayer [facility] Hb A1c [place], MD [facility] 01/03/2021, 11:02 AM

## 2021-01-29 ENCOUNTER — Encounter (HOSPITAL_COMMUNITY): Payer: Self-pay | Admitting: Psychiatry

## 2021-01-29 ENCOUNTER — Other Ambulatory Visit: Payer: Self-pay

## 2021-01-29 ENCOUNTER — Telehealth (INDEPENDENT_AMBULATORY_CARE_PROVIDER_SITE_OTHER): Payer: Medicaid Other | Admitting: Psychiatry

## 2021-01-29 DIAGNOSIS — E119 Type 2 diabetes mellitus without complications: Secondary | ICD-10-CM | POA: Diagnosis not present

## 2021-01-29 DIAGNOSIS — F321 Major depressive disorder, single episode, moderate: Secondary | ICD-10-CM

## 2021-01-29 DIAGNOSIS — E785 Hyperlipidemia, unspecified: Secondary | ICD-10-CM | POA: Diagnosis not present

## 2021-01-29 DIAGNOSIS — Z79899 Other long term (current) drug therapy: Secondary | ICD-10-CM | POA: Diagnosis not present

## 2021-01-29 DIAGNOSIS — Z48298 Encounter for aftercare following other organ transplant: Secondary | ICD-10-CM | POA: Diagnosis not present

## 2021-01-29 DIAGNOSIS — Z125 Encounter for screening for malignant neoplasm of prostate: Secondary | ICD-10-CM | POA: Diagnosis not present

## 2021-01-29 DIAGNOSIS — R7989 Other specified abnormal findings of blood chemistry: Secondary | ICD-10-CM | POA: Diagnosis not present

## 2021-01-29 DIAGNOSIS — Z941 Heart transplant status: Secondary | ICD-10-CM | POA: Diagnosis not present

## 2021-01-29 MED ORDER — ALPRAZOLAM 1 MG PO TABS: ORAL_TABLET | ORAL | 2 refills | Status: DC

## 2021-01-29 MED ORDER — BUPROPION HCL ER (XL) 300 MG PO TB24: 300.0000 mg | ORAL_TABLET | Freq: Every morning | ORAL | 2 refills | Status: DC

## 2021-01-29 MED ORDER — VIIBRYD 10 MG PO TABS: 10.0000 mg | ORAL_TABLET | Freq: Every day | ORAL | 2 refills | Status: DC
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
## 2021-02-05 DIAGNOSIS — F419 Anxiety disorder, unspecified: Secondary | ICD-10-CM | POA: Diagnosis not present

## 2021-02-05 DIAGNOSIS — I10 Essential (primary) hypertension: Secondary | ICD-10-CM | POA: Diagnosis not present

## 2021-02-05 DIAGNOSIS — I129 Hypertensive chronic kidney disease with stage 1 through stage 4 chronic kidney disease, or unspecified chronic kidney disease: Secondary | ICD-10-CM | POA: Diagnosis not present

## 2021-02-05 DIAGNOSIS — Z886 Allergy status to analgesic agent status: Secondary | ICD-10-CM | POA: Diagnosis not present

## 2021-02-05 DIAGNOSIS — Z23 Encounter for immunization: Secondary | ICD-10-CM | POA: Diagnosis not present

## 2021-02-05 DIAGNOSIS — E119 Type 2 diabetes mellitus without complications: Secondary | ICD-10-CM | POA: Diagnosis not present

## 2021-02-05 DIAGNOSIS — F418 Other specified anxiety disorders: Secondary | ICD-10-CM | POA: Diagnosis not present

## 2021-02-05 DIAGNOSIS — Z87891 Personal history of nicotine dependence: Secondary | ICD-10-CM | POA: Diagnosis not present

## 2021-02-05 DIAGNOSIS — Z888 Allergy status to other drugs, medicaments and biological substances status: Secondary | ICD-10-CM | POA: Diagnosis not present

## 2021-02-05 DIAGNOSIS — F32A Depression, unspecified: Secondary | ICD-10-CM | POA: Diagnosis not present

## 2021-02-05 DIAGNOSIS — N189 Chronic kidney disease, unspecified: Secondary | ICD-10-CM | POA: Diagnosis not present

## 2021-02-05 DIAGNOSIS — I2510 Atherosclerotic heart disease of native coronary artery without angina pectoris: Secondary | ICD-10-CM | POA: Diagnosis not present

## 2021-02-05 DIAGNOSIS — E7800 Pure hypercholesterolemia, unspecified: Secondary | ICD-10-CM | POA: Diagnosis not present

## 2021-02-05 DIAGNOSIS — Z941 Heart transplant status: Secondary | ICD-10-CM | POA: Diagnosis not present

## 2021-02-05 DIAGNOSIS — Z885 Allergy status to narcotic agent status: Secondary | ICD-10-CM | POA: Diagnosis not present

## 2021-02-05 DIAGNOSIS — I351 Nonrheumatic aortic (valve) insufficiency: Secondary | ICD-10-CM | POA: Diagnosis not present

## 2021-02-05 DIAGNOSIS — R42 Dizziness and giddiness: Secondary | ICD-10-CM | POA: Diagnosis not present

## 2021-02-05 DIAGNOSIS — Z79899 Other long term (current) drug therapy: Secondary | ICD-10-CM | POA: Diagnosis not present

## 2021-02-05 DIAGNOSIS — E785 Hyperlipidemia, unspecified: Secondary | ICD-10-CM | POA: Diagnosis not present

## 2021-02-05 DIAGNOSIS — G8929 Other chronic pain: Secondary | ICD-10-CM | POA: Diagnosis not present

## 2021-02-05 DIAGNOSIS — K219 Gastro-esophageal reflux disease without esophagitis: Secondary | ICD-10-CM | POA: Diagnosis not present

## 2021-02-05 DIAGNOSIS — Z4821 Encounter for aftercare following heart transplant: Secondary | ICD-10-CM | POA: Diagnosis not present

## 2021-02-05 DIAGNOSIS — N183 Chronic kidney disease, stage 3 unspecified: Secondary | ICD-10-CM | POA: Diagnosis not present

## 2021-02-07 ENCOUNTER — Encounter (HOSPITAL_COMMUNITY): Payer: Self-pay

## 2021-04-05 ENCOUNTER — Encounter: Payer: Self-pay | Admitting: Family Medicine

## 2021-04-05 ENCOUNTER — Ambulatory Visit: Payer: Medicaid Other | Admitting: Family Medicine

## 2021-04-05 ENCOUNTER — Other Ambulatory Visit: Payer: Self-pay

## 2021-04-05 VITALS — BP 141/81 | HR 82 | Ht 68.0 in | Wt 140.0 lb

## 2021-04-05 DIAGNOSIS — N183 Chronic kidney disease, stage 3 unspecified: Secondary | ICD-10-CM

## 2021-04-05 DIAGNOSIS — E1122 Type 2 diabetes mellitus with diabetic chronic kidney disease: Secondary | ICD-10-CM | POA: Diagnosis not present

## 2021-04-05 DIAGNOSIS — K219 Gastro-esophageal reflux disease without esophagitis: Secondary | ICD-10-CM | POA: Diagnosis not present

## 2021-04-05 DIAGNOSIS — N1831 Chronic kidney disease, stage 3a: Secondary | ICD-10-CM

## 2021-04-05 DIAGNOSIS — E781 Pure hyperglyceridemia: Secondary | ICD-10-CM | POA: Diagnosis not present

## 2021-04-05 DIAGNOSIS — I152 Hypertension secondary to endocrine disorders: Secondary | ICD-10-CM

## 2021-04-05 DIAGNOSIS — E1159 Type 2 diabetes mellitus with other circulatory complications: Secondary | ICD-10-CM | POA: Diagnosis not present

## 2021-04-05 LAB — BAYER DCA HB A1C WAIVED: HB A1C (BAYER DCA - WAIVED): 6.7 % (ref <7.0)

## 2021-04-05 NOTE — Patient Instructions (Signed)
continue Nexium but add famotidine as needed.

## 2021-04-05 NOTE — Progress Notes (Signed)
BP (!) 141/81   Pulse 82   Ht '5\' 8"'  (1.727 m)   Wt 140 lb (63.5 kg)   SpO2 99%   BMI 21.29 kg/m   141/81  Subjective:   Patient ID: Anthony Price, male    DOB: Mar 22, 1956, 65 y.o.   MRN: [id number]  HPI: Anthony Price is a 65 y.o. male presenting on 04/05/2021 for Medical Management of Chronic Issues, Diabetes, and Hypertension   HPI Type 2 diabetes mellitus Patient comes in today for recheck of his diabetes. Patient has been currently taking metformin. Patient is currently on an ACE inhibitor/ARB. Patient has not seen an ophthalmologist this year. Patient denies any issues with their feet. The symptom started onset as an adult hypertension and CKD stage III ARE RELATED TO DM   Hypertension Patient is currently on amlodipine 2.5 mg twice daily and carvedilol and ramipril, and their blood pressure today is 141/81. Patient denies any lightheadedness or dizziness. Patient denies headaches, blurred vision, chest pains, shortness of breath, or weakness. Denies any side effects from medication and is content with current medication.   GERD Patient is currently on Nexium.  He is getting some more indigestion in the upper abdomen and says it makes him feel short of breath occasionally but it is from indigestion in the upper abdomen and is getting some burping and belching to.  It will get better when he takes a Tums.  Relevant past medical, surgical, family and social history reviewed and updated as indicated. Interim medical history since our last visit reviewed. Allergies and medications reviewed and updated.  Review of Systems  Constitutional:  Negative for chills and fever.  Eyes:  Negative for visual disturbance.  Respiratory:  Negative for shortness of breath and wheezing.   Cardiovascular:  Negative for chest pain and leg swelling.  Musculoskeletal:  Negative for back pain and gait problem.  Skin:  Negative for rash.  Neurological:  Negative for dizziness, weakness and  light-headedness.  All other systems reviewed and are negative.  Per HPI unless specifically indicated above   Allergies as of 04/05/2021       Reactions   Lactose Intolerance (gi)    bloating   Tramadol Nausea Only        Medication List        Accurate as of April 05, 2021  1:47 PM. If you have any questions, ask your nurse or doctor.          Accu-Chek Aviva Plus w/Device Kit Test qd. DX E11.9   Accu-Chek Softclix Lancets lancets Test once qd. DX E11.9   acetaminophen 500 MG tablet Commonly known as: TYLENOL Take 1,000 mg by mouth every 6 (six) hours as needed for moderate pain. Pain   ALPRAZolam 1 MG tablet Commonly known as: XANAX TAKE (1) TABLET BY MOUTH THREE TIMES DAILY.   amLODipine 2.5 MG tablet Commonly known as: NORVASC Take 2.5 mg by mouth in the morning and at bedtime. What changed: Another medication with the same name was removed. Continue taking this medication, and follow the directions you see here. Changed by: Fransisca Kaufmann Katessa Attridge, MD   aspirin EC 81 MG tablet Take 81 mg by mouth daily.   atorvastatin 40 MG tablet Commonly known as: LIPITOR Take 1 tablet (40 mg total) by mouth daily.   buPROPion 300 MG 24 hr tablet Commonly known as: WELLBUTRIN XL Take 1 tablet (300 mg total) by mouth every morning.   carvedilol 12.5 MG tablet Commonly known  as: COREG Take 1 tablet (12.5 mg total) by mouth 2 (two) times daily.   esomeprazole 20 MG capsule Commonly known as: NEXIUM Take 1 capsule (20 mg total) by mouth 2 (two) times daily before a meal.   glucose blood test strip Commonly known as: Accu-Chek Aviva Plus Test once qd. DX E11.9   metFORMIN 500 MG tablet Commonly known as: GLUCOPHAGE Take 500 mg by mouth 2 (two) times daily with a meal. Takes 2 tabs in am and 1 tab in pm What changed: Another medication with the same name was removed. Continue taking this medication, and follow the directions you see here. Changed by: Fransisca Kaufmann  Casimira Sutphin, MD   mycophenolate 500 MG tablet Commonly known as: CELLCEPT Take 500-1,000 mg by mouth See admin instructions. Take 500 mg by mouth in the morning and 1000 mg at night   Omega-3 1000 MG Caps Take 1,000 mg by mouth in the morning and at bedtime.   PEN NEEDLES 31GX5/16" 31G X 8 MM Misc 1 each by Does not apply route daily.   PROBIOTIC DAILY PO Take 1 capsule by mouth daily.   ramipril 2.5 MG capsule Commonly known as: ALTACE Take 2.5 mg by mouth daily.   sildenafil 20 MG tablet Commonly known as: REVATIO Take 2-3 tablets (40-60 mg total) by mouth as needed.   tacrolimus 0.5 MG capsule Commonly known as: PROGRAF Take 0.5 mg by mouth 2 (two) times daily.   Viibryd 10 MG Tabs Generic drug: Vilazodone HCl Take 1 tablet (10 mg total) by mouth daily.         Objective:   BP (!) 141/81   Pulse 82   Ht '5\' 8"'  (1.727 m)   Wt 140 lb (63.5 kg)   SpO2 99%   BMI 21.29 kg/m   Wt Readings from Last 3 Encounters:  04/05/21 140 lb (63.5 kg)  01/03/21 136 lb (61.7 kg)  10/04/20 131 lb (59.4 kg)    Physical Exam Vitals and nursing note reviewed.  Constitutional:      General: He is not in acute distress.    Appearance: He is well-developed. He is not diaphoretic.  Eyes:     General: No scleral icterus.    Conjunctiva/sclera: Conjunctivae normal.  Neck:     Thyroid: No thyromegaly.  Cardiovascular:     Rate and Rhythm: Normal rate and regular rhythm.     Heart sounds: Normal heart sounds. No murmur heard. Pulmonary:     Effort: Pulmonary effort is normal. No respiratory distress.     Breath sounds: Normal breath sounds. No wheezing.  Musculoskeletal:        General: Normal range of motion.     Cervical back: Neck supple.  Lymphadenopathy:     Cervical: No cervical adenopathy.  Skin:    General: Skin is warm and dry.     Findings: No rash.  Neurological:     Mental Status: He is alert and oriented to person, place, and time.     Coordination:  Coordination normal.  Psychiatric:        Behavior: Behavior normal.      Assessment & Plan:   Problem List Items Addressed This Visit       Cardiovascular and Mediastinum   Hypertension associated with diabetes ([place])   Relevant Medications   metFORMIN (GLUCOPHAGE) 500 MG tablet   amLODipine (NORVASC) 2.5 MG tablet   Other Relevant Orders   CBC with Differential/Platelet   CMP14+EGFR   Lipid panel  Bayer DCA Hb A1c Waived     Digestive   GERD (gastroesophageal reflux disease)     Endocrine   Diabetes type 2, controlled ([place]) - Primary   Relevant Medications   metFORMIN (GLUCOPHAGE) 500 MG tablet   Other Relevant Orders   CBC with Differential/Platelet   CMP14+EGFR   Lipid panel   Bayer DCA Hb A1c Waived     Genitourinary   CKD (chronic kidney disease), stage III (HCC)   Relevant Orders   CBC with Differential/Platelet   CMP14+EGFR   Lipid panel   Bayer DCA Hb A1c Waived     Other   Hypertriglyceridemia   Relevant Medications   amLODipine (NORVASC) 2.5 MG tablet  Continue current medication, lightheadedness and dizziness is improved since he started cutting in half the amlodipine.  Patient seems like he gets some indigestion in the upper abdomen at times, continue Nexium but add famotidine as needed.  Follow up plan: Return in about 3 months (around 07/06/2021), or if symptoms worsen or fail to improve, for Diabetes and hypertension and CKD.  Counseling provided for all of the vaccine components Orders Placed This Encounter  Procedures   CBC with Differential/Platelet   CMP14+EGFR   Lipid panel   Bayer DCA Hb A1c Waived    Caryl Pina, MD [facility] 04/05/2021, 1:47 PM

## 2021-04-06 LAB — CBC WITH DIFFERENTIAL/PLATELET
Basophils Absolute: 0.1 10*3/uL (ref 0.0–0.2)
Basos: 1 %
EOS (ABSOLUTE): 0.1 10*3/uL (ref 0.0–0.4)
Eos: 2 %
Hematocrit: 43.3 % (ref 37.5–51.0)
Hemoglobin: 14.3 g/dL (ref 13.0–17.7)
Immature Grans (Abs): 0 10*3/uL (ref 0.0–0.1)
Immature Granulocytes: 0 %
Lymphocytes Absolute: 2.7 10*3/uL (ref 0.7–3.1)
Lymphs: 37 %
MCH: 27 pg (ref 26.6–33.0)
MCHC: 33 g/dL (ref 31.5–35.7)
MCV: 82 fL (ref 79–97)
Monocytes Absolute: 0.8 10*3/uL (ref 0.1–0.9)
Monocytes: 10 %
Neutrophils Absolute: 3.7 10*3/uL (ref 1.4–7.0)
Neutrophils: 50 %
Platelets: 161 10*3/uL (ref 150–450)
RBC: 5.29 x10E6/uL (ref 4.14–5.80)
RDW: 13.3 % (ref 11.6–15.4)
WBC: 7.4 10*3/uL (ref 3.4–10.8)

## 2021-04-06 LAB — CMP14+EGFR
ALT: 11 IU/L (ref 0–44)
AST: 13 IU/L (ref 0–40)
Albumin/Globulin Ratio: 2 (ref 1.2–2.2)
Albumin: 4.6 g/dL (ref 3.8–4.8)
Alkaline Phosphatase: 61 IU/L (ref 44–121)
BUN/Creatinine Ratio: 12 (ref 10–24)
BUN: 19 mg/dL (ref 8–27)
Bilirubin Total: 0.4 mg/dL (ref 0.0–1.2)
CO2: 21 mmol/L (ref 20–29)
Calcium: 9.1 mg/dL (ref 8.6–10.2)
Chloride: 103 mmol/L (ref 96–106)
Creatinine, Ser: 1.56 mg/dL — ABNORMAL HIGH (ref 0.76–1.27)
Globulin, Total: 2.3 g/dL (ref 1.5–4.5)
Glucose: 171 mg/dL — ABNORMAL HIGH (ref 65–99)
Potassium: 4.8 mmol/L (ref 3.5–5.2)
Sodium: 139 mmol/L (ref 134–144)
Total Protein: 6.9 g/dL (ref 6.0–8.5)
eGFR: 50 mL/min/{1.73_m2} — ABNORMAL LOW (ref >59)

## 2021-04-06 LAB — LIPID PANEL
Chol/HDL Ratio: 3.8 ratio (ref 0.0–5.0)
Cholesterol, Total: 118 mg/dL (ref 100–199)
HDL: 31 mg/dL — ABNORMAL LOW (ref >39)
LDL Chol Calc (NIH): 52 mg/dL (ref 0–99)
Triglycerides: 215 mg/dL — ABNORMAL HIGH (ref 0–149)
VLDL Cholesterol Cal: 35 mg/dL (ref 5–40)

## 2021-04-09 ENCOUNTER — Telehealth (HOSPITAL_COMMUNITY): Payer: Self-pay | Admitting: *Deleted

## 2021-04-09 NOTE — Telephone Encounter (Signed)
Patient is needing refills for all of his medications provider prescribes for him. Patient appt was cancelled due to provider being on medical leave. Message was left to call office to resch appt.

## 2021-04-10 ENCOUNTER — Other Ambulatory Visit (HOSPITAL_COMMUNITY): Payer: Self-pay | Admitting: Psychiatry

## 2021-04-10 DIAGNOSIS — F321 Major depressive disorder, single episode, moderate: Secondary | ICD-10-CM

## 2021-04-10 MED ORDER — BUPROPION HCL ER (XL) 300 MG PO TB24: 300.0000 mg | ORAL_TABLET | Freq: Every morning | ORAL | 2 refills | Status: DC

## 2021-04-10 MED ORDER — VILAZODONE HCL 10 MG PO TABS: 10.0000 mg | ORAL_TABLET | Freq: Every day | ORAL | 2 refills | Status: DC

## 2021-04-10 MED ORDER — ALPRAZOLAM 1 MG PO TABS: ORAL_TABLET | ORAL | 2 refills | Status: DC

## 2021-04-10 NOTE — Telephone Encounter (Signed)
sent 

## 2021-04-26 ENCOUNTER — Telehealth (HOSPITAL_COMMUNITY): Payer: Medicaid Other | Admitting: Psychiatry

## 2021-05-02 DIAGNOSIS — E559 Vitamin D deficiency, unspecified: Secondary | ICD-10-CM | POA: Diagnosis not present

## 2021-05-02 DIAGNOSIS — Z79899 Other long term (current) drug therapy: Secondary | ICD-10-CM | POA: Diagnosis not present

## 2021-05-02 DIAGNOSIS — Z941 Heart transplant status: Secondary | ICD-10-CM | POA: Diagnosis not present

## 2021-05-09 DIAGNOSIS — I10 Essential (primary) hypertension: Secondary | ICD-10-CM | POA: Diagnosis not present

## 2021-05-09 DIAGNOSIS — R0989 Other specified symptoms and signs involving the circulatory and respiratory systems: Secondary | ICD-10-CM | POA: Diagnosis not present

## 2021-05-09 DIAGNOSIS — N183 Chronic kidney disease, stage 3 unspecified: Secondary | ICD-10-CM | POA: Diagnosis not present

## 2021-05-09 DIAGNOSIS — Z941 Heart transplant status: Secondary | ICD-10-CM | POA: Diagnosis not present

## 2021-05-23 ENCOUNTER — Telehealth (HOSPITAL_COMMUNITY): Payer: Medicaid Other | Admitting: Psychiatry

## 2021-06-17 ENCOUNTER — Other Ambulatory Visit: Payer: Self-pay

## 2021-06-17 ENCOUNTER — Telehealth (INDEPENDENT_AMBULATORY_CARE_PROVIDER_SITE_OTHER): Payer: Medicaid Other | Admitting: Psychiatry

## 2021-06-17 ENCOUNTER — Encounter (HOSPITAL_COMMUNITY): Payer: Self-pay | Admitting: Psychiatry

## 2021-06-17 DIAGNOSIS — F321 Major depressive disorder, single episode, moderate: Secondary | ICD-10-CM | POA: Diagnosis not present

## 2021-06-17 MED ORDER — BUPROPION HCL ER (XL) 300 MG PO TB24: 300.0000 mg | ORAL_TABLET | Freq: Every morning | ORAL | 2 refills | Status: DC

## 2021-06-17 MED ORDER — TRAZODONE HCL 50 MG PO TABS: 50.0000 mg | ORAL_TABLET | Freq: Every day | ORAL | 2 refills | Status: DC

## 2021-06-17 MED ORDER — ALPRAZOLAM 1 MG PO TABS: ORAL_TABLET | ORAL | 2 refills | Status: DC

## 2021-06-17 MED ORDER — VILAZODONE HCL 10 MG PO TABS: 10.0000 mg | ORAL_TABLET | Freq: Every day | ORAL | 2 refills | Status: DC
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
## 2021-06-24 DIAGNOSIS — Z941 Heart transplant status: Secondary | ICD-10-CM | POA: Diagnosis not present

## 2021-07-12 ENCOUNTER — Other Ambulatory Visit: Payer: Self-pay

## 2021-07-12 ENCOUNTER — Ambulatory Visit: Payer: Medicaid Other | Admitting: Family Medicine

## 2021-07-12 ENCOUNTER — Encounter: Payer: Self-pay | Admitting: Family Medicine

## 2021-07-12 VITALS — BP 139/80 | HR 79 | Temp 98.1°F | Ht 68.0 in | Wt 137.2 lb

## 2021-07-12 DIAGNOSIS — I152 Hypertension secondary to endocrine disorders: Secondary | ICD-10-CM

## 2021-07-12 DIAGNOSIS — E1122 Type 2 diabetes mellitus with diabetic chronic kidney disease: Secondary | ICD-10-CM

## 2021-07-12 DIAGNOSIS — E1159 Type 2 diabetes mellitus with other circulatory complications: Secondary | ICD-10-CM

## 2021-07-12 DIAGNOSIS — Z23 Encounter for immunization: Secondary | ICD-10-CM

## 2021-07-12 DIAGNOSIS — E781 Pure hyperglyceridemia: Secondary | ICD-10-CM

## 2021-07-12 DIAGNOSIS — N183 Chronic kidney disease, stage 3 unspecified: Secondary | ICD-10-CM | POA: Diagnosis not present

## 2021-07-12 LAB — BAYER DCA HB A1C WAIVED: HB A1C (BAYER DCA - WAIVED): 5.9 % — ABNORMAL HIGH (ref 4.8–5.6)

## 2021-07-12 NOTE — Progress Notes (Signed)
BP 139/80   Pulse 79   Temp 98.1 F (36.7 C) (Temporal)   Ht '5\' 8"'  (1.727 m)   Wt 137 lb 4 oz (62.3 kg)   BMI 20.87 kg/m    Subjective:   Patient ID: Anthony Price, male    DOB: 08/10/1956, 65 y.o.   MRN: [id number]  HPI: Anthony Price is a 65 y.o. male presenting on 07/12/2021 for Medical Management of Chronic Issues, Hyperlipidemia, Hypertension, and Diabetes   HPI Type 2 diabetes mellitus Patient comes in today for recheck of his diabetes. Patient has been currently taking metformin. Patient is currently on an ACE inhibitor/ARB. Patient has seen an ophthalmologist this year. Patient denies any issues with their feet. The symptom started onset as an adult hypertension and hyperlipidemia ARE RELATED TO DM   Hypertension Patient is currently on carvedilol and ramipril and amlodipine, and their blood pressure today is 139/80. Patient denies any lightheadedness or dizziness. Patient denies headaches, blurred vision, chest pains, shortness of breath, or weakness. Denies any side effects from medication and is content with current medication.   Hyperlipidemia Patient is coming in for recheck of his hyperlipidemia. The patient is currently taking fish oil and Lipitor. They deny any issues with myalgias or history of liver damage from it. They deny any focal numbness or weakness or chest pain.   Relevant past medical, surgical, family and social history reviewed and updated as indicated. Interim medical history since our last visit reviewed. Allergies and medications reviewed and updated.  Review of Systems  Constitutional:  Negative for chills and fever.  Eyes:  Negative for visual disturbance.  Respiratory:  Negative for shortness of breath and wheezing.   Cardiovascular:  Negative for chest pain and leg swelling.  Musculoskeletal:  Negative for back pain and gait problem.  Skin:  Negative for rash.  Neurological:  Negative for dizziness, weakness and light-headedness.  All  other systems reviewed and are negative.  Per HPI unless specifically indicated above   Allergies as of 07/12/2021       Reactions   Lactose Intolerance (gi)    bloating   Tramadol Nausea Only        Medication List        Accurate as of July 12, 2021 11:42 AM. If you have any questions, ask your nurse or doctor.          Accu-Chek Aviva Plus w/Device Kit Test qd. DX E11.9   Accu-Chek Softclix Lancets lancets Test once qd. DX E11.9   acetaminophen 500 MG tablet Commonly known as: TYLENOL Take 1,000 mg by mouth every 6 (six) hours as needed for moderate pain. Pain   ALPRAZolam 1 MG tablet Commonly known as: XANAX TAKE (1) TABLET BY MOUTH THREE TIMES DAILY.   amLODipine 2.5 MG tablet Commonly known as: NORVASC Take 2.5 mg by mouth in the morning and at bedtime.   aspirin EC 81 MG tablet Take 81 mg by mouth daily.   atorvastatin 40 MG tablet Commonly known as: LIPITOR Take 1 tablet (40 mg total) by mouth daily.   buPROPion 300 MG 24 hr tablet Commonly known as: WELLBUTRIN XL Take 1 tablet (300 mg total) by mouth every morning.   carvedilol 12.5 MG tablet Commonly known as: COREG Take 1 tablet (12.5 mg total) by mouth 2 (two) times daily.   esomeprazole 20 MG capsule Commonly known as: NEXIUM Take 1 capsule (20 mg total) by mouth 2 (two) times daily before a meal.  glucose blood test strip Commonly known as: Accu-Chek Aviva Plus Test once qd. DX E11.9   metFORMIN 500 MG tablet Commonly known as: GLUCOPHAGE Take 500 mg by mouth 2 (two) times daily with a meal. Takes 2 tabs in am and 1 tab in pm   mycophenolate 500 MG tablet Commonly known as: CELLCEPT Take 500-1,000 mg by mouth See admin instructions. Take 500 mg by mouth in the morning and 1000 mg at night   Omega-3 1000 MG Caps Take 1,000 mg by mouth in the morning and at bedtime.   PEN NEEDLES 31GX5/16" 31G X 8 MM Misc 1 each by Does not apply route daily.   PROBIOTIC DAILY PO Take 1  capsule by mouth daily.   ramipril 2.5 MG capsule Commonly known as: ALTACE Take 2.5 mg by mouth daily.   sildenafil 20 MG tablet Commonly known as: REVATIO Take 2-3 tablets (40-60 mg total) by mouth as needed.   tacrolimus 0.5 MG capsule Commonly known as: PROGRAF Take 0.5 mg by mouth 2 (two) times daily.   traZODone 50 MG tablet Commonly known as: DESYREL Take 1 tablet (50 mg total) by mouth at bedtime.   Vilazodone HCl 10 MG Tabs Commonly known as: Viibryd Take 1 tablet (10 mg total) by mouth daily.         Objective:   BP 139/80   Pulse 79   Temp 98.1 F (36.7 C) (Temporal)   Ht '5\' 8"'  (1.727 m)   Wt 137 lb 4 oz (62.3 kg)   BMI 20.87 kg/m   Wt Readings from Last 3 Encounters:  07/12/21 137 lb 4 oz (62.3 kg)  04/05/21 140 lb (63.5 kg)  01/03/21 136 lb (61.7 kg)    Physical Exam Vitals and nursing note reviewed.  Constitutional:      General: He is not in acute distress.    Appearance: He is well-developed. He is not diaphoretic.  Eyes:     General: No scleral icterus.    Conjunctiva/sclera: Conjunctivae normal.  Neck:     Thyroid: No thyromegaly.  Cardiovascular:     Rate and Rhythm: Normal rate and regular rhythm.     Heart sounds: Normal heart sounds. No murmur heard. Pulmonary:     Effort: Pulmonary effort is normal. No respiratory distress.     Breath sounds: Normal breath sounds. No wheezing.  Musculoskeletal:        General: Normal range of motion.     Cervical back: Neck supple.  Lymphadenopathy:     Cervical: No cervical adenopathy.  Skin:    General: Skin is warm and dry.     Findings: No rash.  Neurological:     Mental Status: He is alert and oriented to person, place, and time.     Coordination: Coordination normal.  Psychiatric:        Behavior: Behavior normal.      Assessment & Plan:   Problem List Items Addressed This Visit       Cardiovascular and Mediastinum   Hypertension associated with diabetes ([place])   Relevant  Orders   CMP14+EGFR     Endocrine   Diabetes type 2, controlled ([place]) - Primary   Relevant Orders   CBC with Differential/Platelet   Bayer DCA Hb A1c Waived     Other   Hypertriglyceridemia   Relevant Orders   Lipid panel    Continue current medicine, continue follow-up with cardiologist, has a stress test next week.  Blood pressure looks decent today but  he says it runs better at home when he checks it.  A1c is 5.9, looks great Follow up plan: Return in about 3 months (around 10/11/2021), or if symptoms worsen or fail to improve, for Diabetes and hypertension and cholesterol recheck.  Counseling provided for all of the vaccine components Orders Placed This Encounter  Procedures   CBC with Differential/Platelet   CMP14+EGFR   Lipid panel   Bayer DCA Hb A1c [place], MD [facility] 07/12/2021, 11:42 AM

## 2021-07-13 LAB — CBC WITH DIFFERENTIAL/PLATELET
Basophils Absolute: 0 10*3/uL (ref 0.0–0.2)
Basos: 1 %
EOS (ABSOLUTE): 0.1 10*3/uL (ref 0.0–0.4)
Eos: 2 %
Hematocrit: 43.6 % (ref 37.5–51.0)
Hemoglobin: 14.7 g/dL (ref 13.0–17.7)
Immature Grans (Abs): 0 10*3/uL (ref 0.0–0.1)
Immature Granulocytes: 0 %
Lymphocytes Absolute: 2.8 10*3/uL (ref 0.7–3.1)
Lymphs: 38 %
MCH: 27.8 pg (ref 26.6–33.0)
MCHC: 33.7 g/dL (ref 31.5–35.7)
MCV: 83 fL (ref 79–97)
Monocytes Absolute: 0.7 10*3/uL (ref 0.1–0.9)
Monocytes: 10 %
Neutrophils Absolute: 3.6 10*3/uL (ref 1.4–7.0)
Neutrophils: 49 %
Platelets: 171 10*3/uL (ref 150–450)
RBC: 5.28 x10E6/uL (ref 4.14–5.80)
RDW: 11.9 % (ref 11.6–15.4)
WBC: 7.2 10*3/uL (ref 3.4–10.8)

## 2021-07-13 LAB — CMP14+EGFR
ALT: 10 IU/L (ref 0–44)
AST: 14 IU/L (ref 0–40)
Albumin/Globulin Ratio: 2.3 — ABNORMAL HIGH (ref 1.2–2.2)
Albumin: 5 g/dL — ABNORMAL HIGH (ref 3.8–4.8)
Alkaline Phosphatase: 61 IU/L (ref 44–121)
BUN/Creatinine Ratio: 10 (ref 10–24)
BUN: 17 mg/dL (ref 8–27)
Bilirubin Total: 0.6 mg/dL (ref 0.0–1.2)
CO2: 22 mmol/L (ref 20–29)
Calcium: 9.7 mg/dL (ref 8.6–10.2)
Chloride: 99 mmol/L (ref 96–106)
Creatinine, Ser: 1.67 mg/dL — ABNORMAL HIGH (ref 0.76–1.27)
Globulin, Total: 2.2 g/dL (ref 1.5–4.5)
Glucose: 136 mg/dL — ABNORMAL HIGH (ref 65–99)
Potassium: 5.6 mmol/L — ABNORMAL HIGH (ref 3.5–5.2)
Sodium: 136 mmol/L (ref 134–144)
Total Protein: 7.2 g/dL (ref 6.0–8.5)
eGFR: 46 mL/min/{1.73_m2} — ABNORMAL LOW (ref >59)

## 2021-07-13 LAB — LIPID PANEL
Chol/HDL Ratio: 4.4 ratio (ref 0.0–5.0)
Cholesterol, Total: 118 mg/dL (ref 100–199)
HDL: 27 mg/dL — ABNORMAL LOW (ref >39)
LDL Chol Calc (NIH): 54 mg/dL (ref 0–99)
Triglycerides: 231 mg/dL — ABNORMAL HIGH (ref 0–149)
VLDL Cholesterol Cal: 37 mg/dL (ref 5–40)

## 2021-07-17 DIAGNOSIS — Z941 Heart transplant status: Secondary | ICD-10-CM | POA: Diagnosis not present

## 2021-07-17 DIAGNOSIS — I2584 Coronary atherosclerosis due to calcified coronary lesion: Secondary | ICD-10-CM | POA: Diagnosis not present

## 2021-07-17 DIAGNOSIS — I2510 Atherosclerotic heart disease of native coronary artery without angina pectoris: Secondary | ICD-10-CM | POA: Diagnosis not present

## 2021-07-17 DIAGNOSIS — Z9889 Other specified postprocedural states: Secondary | ICD-10-CM | POA: Diagnosis not present

## 2021-07-17 DIAGNOSIS — Z4821 Encounter for aftercare following heart transplant: Secondary | ICD-10-CM | POA: Diagnosis not present

## 2021-07-17 DIAGNOSIS — I700 Atherosclerosis of aorta: Secondary | ICD-10-CM | POA: Diagnosis not present

## 2021-07-18 DIAGNOSIS — Z941 Heart transplant status: Secondary | ICD-10-CM | POA: Diagnosis not present

## 2021-08-26 DIAGNOSIS — Z79899 Other long term (current) drug therapy: Secondary | ICD-10-CM | POA: Diagnosis not present

## 2021-08-26 DIAGNOSIS — Z941 Heart transplant status: Secondary | ICD-10-CM | POA: Diagnosis not present

## 2021-08-26 DIAGNOSIS — R7989 Other specified abnormal findings of blood chemistry: Secondary | ICD-10-CM | POA: Diagnosis not present

## 2021-08-26 DIAGNOSIS — E785 Hyperlipidemia, unspecified: Secondary | ICD-10-CM | POA: Diagnosis not present

## 2021-08-26 DIAGNOSIS — Z48298 Encounter for aftercare following other organ transplant: Secondary | ICD-10-CM | POA: Diagnosis not present

## 2021-09-09 ENCOUNTER — Telehealth (HOSPITAL_COMMUNITY): Payer: Medicaid Other | Admitting: Psychiatry

## 2021-09-09 ENCOUNTER — Other Ambulatory Visit: Payer: Self-pay

## 2021-09-10 ENCOUNTER — Other Ambulatory Visit (HOSPITAL_COMMUNITY): Payer: Self-pay | Admitting: Psychiatry

## 2021-09-10 DIAGNOSIS — F321 Major depressive disorder, single episode, moderate: Secondary | ICD-10-CM

## 2021-09-11 NOTE — Telephone Encounter (Signed)
Call for appt, pt missed appt Monday

## 2021-09-12 ENCOUNTER — Encounter (HOSPITAL_COMMUNITY): Payer: Self-pay | Admitting: Psychiatry

## 2021-09-12 ENCOUNTER — Other Ambulatory Visit: Payer: Self-pay

## 2021-09-12 ENCOUNTER — Telehealth (INDEPENDENT_AMBULATORY_CARE_PROVIDER_SITE_OTHER): Payer: Medicaid Other | Admitting: Psychiatry

## 2021-09-12 DIAGNOSIS — F321 Major depressive disorder, single episode, moderate: Secondary | ICD-10-CM | POA: Diagnosis not present

## 2021-09-12 MED ORDER — VILAZODONE HCL 10 MG PO TABS: 10.0000 mg | ORAL_TABLET | Freq: Every day | ORAL | 2 refills | Status: DC

## 2021-09-12 MED ORDER — BUPROPION HCL ER (XL) 300 MG PO TB24: ORAL_TABLET | ORAL | 2 refills | Status: DC

## 2021-09-12 MED ORDER — ALPRAZOLAM 1 MG PO TABS: ORAL_TABLET | ORAL | 2 refills | Status: DC
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
## 2021-10-14 ENCOUNTER — Encounter: Payer: Self-pay | Admitting: Family Medicine

## 2021-10-14 ENCOUNTER — Ambulatory Visit: Payer: Medicaid Other | Admitting: Family Medicine

## 2021-10-14 VITALS — BP 137/78 | HR 88 | Ht 68.0 in | Wt 139.0 lb

## 2021-10-14 DIAGNOSIS — N183 Chronic kidney disease, stage 3 unspecified: Secondary | ICD-10-CM | POA: Diagnosis not present

## 2021-10-14 DIAGNOSIS — I152 Hypertension secondary to endocrine disorders: Secondary | ICD-10-CM | POA: Diagnosis not present

## 2021-10-14 DIAGNOSIS — E781 Pure hyperglyceridemia: Secondary | ICD-10-CM

## 2021-10-14 DIAGNOSIS — E1122 Type 2 diabetes mellitus with diabetic chronic kidney disease: Secondary | ICD-10-CM | POA: Diagnosis not present

## 2021-10-14 DIAGNOSIS — N1831 Chronic kidney disease, stage 3a: Secondary | ICD-10-CM

## 2021-10-14 DIAGNOSIS — E1159 Type 2 diabetes mellitus with other circulatory complications: Secondary | ICD-10-CM

## 2021-10-14 LAB — CBC WITH DIFFERENTIAL/PLATELET
Basophils Absolute: 0.1 10*3/uL (ref 0.0–0.2)
Basos: 1 %
EOS (ABSOLUTE): 0.1 10*3/uL (ref 0.0–0.4)
Eos: 2 %
Hematocrit: 43 % (ref 37.5–51.0)
Hemoglobin: 13.9 g/dL (ref 13.0–17.7)
Immature Grans (Abs): 0 10*3/uL (ref 0.0–0.1)
Immature Granulocytes: 0 %
Lymphocytes Absolute: 2.8 10*3/uL (ref 0.7–3.1)
Lymphs: 37 %
MCH: 27 pg (ref 26.6–33.0)
MCHC: 32.3 g/dL (ref 31.5–35.7)
MCV: 84 fL (ref 79–97)
Monocytes Absolute: 0.7 10*3/uL (ref 0.1–0.9)
Monocytes: 10 %
Neutrophils Absolute: 4 10*3/uL (ref 1.4–7.0)
Neutrophils: 50 %
Platelets: 163 10*3/uL (ref 150–450)
RBC: 5.15 x10E6/uL (ref 4.14–5.80)
RDW: 11.9 % (ref 11.6–15.4)
WBC: 7.7 10*3/uL (ref 3.4–10.8)

## 2021-10-14 LAB — CMP14+EGFR
ALT: 12 IU/L (ref 0–44)
AST: 17 IU/L (ref 0–40)
Albumin/Globulin Ratio: 2.5 — ABNORMAL HIGH (ref 1.2–2.2)
Albumin: 4.7 g/dL (ref 3.8–4.8)
Alkaline Phosphatase: 61 IU/L (ref 44–121)
BUN/Creatinine Ratio: 13 (ref 10–24)
BUN: 23 mg/dL (ref 8–27)
Bilirubin Total: 0.5 mg/dL (ref 0.0–1.2)
CO2: 23 mmol/L (ref 20–29)
Calcium: 9.8 mg/dL (ref 8.6–10.2)
Chloride: 100 mmol/L (ref 96–106)
Creatinine, Ser: 1.73 mg/dL — ABNORMAL HIGH (ref 0.76–1.27)
Globulin, Total: 1.9 g/dL (ref 1.5–4.5)
Glucose: 219 mg/dL — ABNORMAL HIGH (ref 70–99)
Potassium: 5.3 mmol/L — ABNORMAL HIGH (ref 3.5–5.2)
Sodium: 138 mmol/L (ref 134–144)
Total Protein: 6.6 g/dL (ref 6.0–8.5)
eGFR: 44 mL/min/{1.73_m2} — ABNORMAL LOW (ref >59)

## 2021-10-14 LAB — LIPID PANEL
Chol/HDL Ratio: 4 ratio (ref 0.0–5.0)
Cholesterol, Total: 112 mg/dL (ref 100–199)
HDL: 28 mg/dL — ABNORMAL LOW (ref >39)
LDL Chol Calc (NIH): 47 mg/dL (ref 0–99)
Triglycerides: 235 mg/dL — ABNORMAL HIGH (ref 0–149)
VLDL Cholesterol Cal: 37 mg/dL (ref 5–40)

## 2021-10-14 LAB — BAYER DCA HB A1C WAIVED: HB A1C (BAYER DCA - WAIVED): 6.4 % — ABNORMAL HIGH (ref 4.8–5.6)

## 2021-10-14 NOTE — Progress Notes (Signed)
BP 137/78    Pulse 88    Ht _0  (1.727 m)    Wt 139 lb (63 kg)    SpO2 100%    BMI 21.13 kg/m    Subjective:   Patient ID: Anthony Price, male    DOB: [birth year], 65 y.o.   MRN: [id number]  HPI: Anthony Price is a 65 y.o. male presenting on 10/14/2021 for Medical Management of Chronic Issues, Diabetes, Hyperlipidemia, and Hypertension   HPI Type 2 diabetes mellitus Patient comes in today for recheck of his diabetes. Patient has been currently taking metformin. Patient is currently on an ACE inhibitor/ARB. Patient has seen an ophthalmologist this year. Patient denies any issues with their feet except the occasional burning that happens every couple weeks. The symptom started onset as an adult hypertension and CKD and hypertriglyceridemia ARE RELATED TO DM   Hypertension Patient is currently on amlodipine and carvedilol and ramipril, and their blood pressure today is 137/78. Patient denies any lightheadedness or dizziness. Patient denies headaches, blurred vision, chest pains, shortness of breath, or weakness. Denies any side effects from medication and is content with current medication.   Hypertriglyceridemia Patient is coming in for recheck of his hyperlipidemia. The patient is currently taking fish oils and Lipitor. They deny any issues with myalgias or history of liver damage from it. They deny any focal numbness or weakness or chest pain.   Relevant past medical, surgical, family and social history reviewed and updated as indicated. Interim medical history since our last visit reviewed. Allergies and medications reviewed and updated.  Review of Systems  Constitutional:  Negative for chills and fever.  Eyes:  Negative for visual disturbance.  Respiratory:  Negative for shortness of breath and wheezing.   Cardiovascular:  Negative for chest pain and leg swelling.  Musculoskeletal:  Negative for back pain and gait problem.  Skin:  Negative for rash.  Neurological:   Negative for dizziness, weakness and light-headedness.  All other systems reviewed and are negative.  Per HPI unless specifically indicated above   Allergies as of 10/14/2021       Reactions   Lactose Intolerance (gi)    bloating   Tramadol Nausea Only        Medication List        Accurate as of October 14, 2021 11:10 AM. If you have any questions, ask your nurse or doctor.          Accu-Chek Aviva Plus w/Device Kit Test qd. DX E11.9   Accu-Chek Softclix Lancets lancets Test once qd. DX E11.9   acetaminophen 500 MG tablet Commonly known as: TYLENOL Take 1,000 mg by mouth every 6 (six) hours as needed for moderate pain. Pain   ALPRAZolam 1 MG tablet Commonly known as: XANAX TAKE (1) TABLET BY MOUTH THREE TIMES DAILY.   amLODipine 2.5 MG tablet Commonly known as: NORVASC Take 2.5 mg by mouth in the morning and at bedtime.   aspirin EC 81 MG tablet Take 81 mg by mouth daily.   atorvastatin 40 MG tablet Commonly known as: LIPITOR Take 1 tablet (40 mg total) by mouth daily.   buPROPion 300 MG 24 hr tablet Commonly known as: WELLBUTRIN XL TAKE 1 TABLET BY MOUTH ONCE EVERY MORNING.   carvedilol 12.5 MG tablet Commonly known as: COREG Take 1 tablet (12.5 mg total) by mouth 2 (two) times daily.   esomeprazole 20 MG capsule Commonly known as: NEXIUM Take 1 capsule (20 mg total) by mouth 2 (  two) times daily before a meal.   glucose blood test strip Commonly known as: Accu-Chek Aviva Plus Test once qd. DX E11.9   metFORMIN 500 MG tablet Commonly known as: GLUCOPHAGE Take 500 mg by mouth 2 (two) times daily with a meal. Takes 2 tabs in am and 1 tab in pm   mycophenolate 500 MG tablet Commonly known as: CELLCEPT Take 500-1,000 mg by mouth See admin instructions. Take 500 mg by mouth in the morning and 1000 mg at night   Omega-3 1000 MG Caps Take 1,000 mg by mouth in the morning and at bedtime.   PEN NEEDLES 31GX5/16" 31G X 8 MM Misc 1 each by Does  not apply route daily.   PROBIOTIC DAILY PO Take 1 capsule by mouth daily.   ramipril 2.5 MG capsule Commonly known as: ALTACE Take 2.5 mg by mouth daily.   sildenafil 20 MG tablet Commonly known as: REVATIO Take 2-3 tablets (40-60 mg total) by mouth as needed.   tacrolimus 0.5 MG capsule Commonly known as: PROGRAF Take 0.5 mg by mouth 2 (two) times daily.   Vilazodone HCl 10 MG Tabs Commonly known as: Viibryd Take 1 tablet (10 mg total) by mouth daily.         Objective:   BP 137/78    Pulse 88    Ht _0  (1.727 m)    Wt 139 lb (63 kg)    SpO2 100%    BMI 21.13 kg/m   Wt Readings from Last 3 Encounters:  10/14/21 139 lb (63 kg)  07/12/21 137 lb 4 oz (62.3 kg)  04/05/21 140 lb (63.5 kg)    Physical Exam Vitals and nursing note reviewed.  Constitutional:      General: He is not in acute distress.    Appearance: He is well-developed. He is not diaphoretic.  Eyes:     General: No scleral icterus.    Conjunctiva/sclera: Conjunctivae normal.  Neck:     Thyroid: No thyromegaly.  Cardiovascular:     Rate and Rhythm: Normal rate and regular rhythm.     Heart sounds: Normal heart sounds. No murmur heard. Pulmonary:     Effort: Pulmonary effort is normal. No respiratory distress.     Breath sounds: Normal breath sounds. No wheezing.  Musculoskeletal:        General: Normal range of motion.     Cervical back: Neck supple.  Lymphadenopathy:     Cervical: No cervical adenopathy.  Skin:    General: Skin is warm and dry.     Findings: No rash.  Neurological:     Mental Status: He is alert and oriented to person, place, and time.     Coordination: Coordination normal.  Psychiatric:        Behavior: Behavior normal.      Assessment & Plan:   Problem List Items Addressed This Visit       Cardiovascular and Mediastinum   Hypertension associated with diabetes ([place])   Relevant Orders   CBC with Differential/Platelet   CMP14+EGFR   Lipid panel   Bayer DCA Hb  A1c Waived     Endocrine   Diabetes type 2, controlled ([place]) - Primary   Relevant Orders   CBC with Differential/Platelet   CMP14+EGFR   Lipid panel   Bayer DCA Hb A1c Waived     Genitourinary   CKD (chronic kidney disease), stage III (HCC)     Other   Hypertriglyceridemia   Relevant Orders   CBC  with Differential/Platelet   CMP14+EGFR   Lipid panel   Bayer DCA Hb A1c Waived    A1c looks good at 6.4.  No change in medication Follow up plan: Return in about 3 months (around 01/12/2022), or if symptoms worsen or fail to improve, for Diabetes recheck.  Counseling provided for all of the vaccine components Orders Placed This Encounter  Procedures   CBC with Differential/Platelet   CMP14+EGFR   Lipid panel   Bayer DCA Hb A1c [place], MD [facility] 10/14/2021, 11:10 AM

## 2021-12-03 ENCOUNTER — Encounter (HOSPITAL_COMMUNITY): Payer: Self-pay | Admitting: Psychiatry

## 2021-12-03 ENCOUNTER — Other Ambulatory Visit: Payer: Self-pay

## 2021-12-03 ENCOUNTER — Telehealth (INDEPENDENT_AMBULATORY_CARE_PROVIDER_SITE_OTHER): Payer: Medicaid Other | Admitting: Psychiatry

## 2021-12-03 DIAGNOSIS — F321 Major depressive disorder, single episode, moderate: Secondary | ICD-10-CM

## 2021-12-03 MED ORDER — VILAZODONE HCL 10 MG PO TABS: 10.0000 mg | ORAL_TABLET | Freq: Every day | ORAL | 2 refills | Status: DC

## 2021-12-03 MED ORDER — ALPRAZOLAM 1 MG PO TABS: ORAL_TABLET | ORAL | 2 refills | Status: DC

## 2021-12-03 MED ORDER — BUPROPION HCL ER (XL) 300 MG PO TB24: ORAL_TABLET | ORAL | 2 refills | Status: DC
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
## 2021-12-09 ENCOUNTER — Telehealth: Payer: Self-pay | Admitting: Family Medicine

## 2021-12-09 MED ORDER — AMLODIPINE BESYLATE 2.5 MG PO TABS: 2.5000 mg | ORAL_TABLET | Freq: Every day | ORAL | 1 refills | Status: DC

## 2021-12-09 NOTE — Telephone Encounter (Signed)
Please advise? I don't think there is a 2.5mg  dose is there?

## 2021-12-09 NOTE — Telephone Encounter (Signed)
Pt called stating that Dr Dettinger had him taking 5mg  of Amlodipine but says that Rx was too strong so Dr Dettinger advised him to cut his pills in half and only take half.   Pt says the pills are too small to cut in half. Wants to know if new Rx for 2.5 mg can be sent in?  Please advise and call patient.

## 2021-12-09 NOTE — Telephone Encounter (Signed)
Yes please send in amlodipine 2.5 mg daily for 90 days with 1 refill

## 2021-12-09 NOTE — Telephone Encounter (Signed)
Pt aware and rx sent in per Dr Dettinger.

## 2021-12-18 DIAGNOSIS — R7989 Other specified abnormal findings of blood chemistry: Secondary | ICD-10-CM | POA: Diagnosis not present

## 2021-12-18 DIAGNOSIS — Z79899 Other long term (current) drug therapy: Secondary | ICD-10-CM | POA: Diagnosis not present

## 2021-12-18 DIAGNOSIS — E119 Type 2 diabetes mellitus without complications: Secondary | ICD-10-CM | POA: Diagnosis not present

## 2021-12-18 DIAGNOSIS — Z941 Heart transplant status: Secondary | ICD-10-CM | POA: Diagnosis not present

## 2021-12-18 DIAGNOSIS — Z48298 Encounter for aftercare following other organ transplant: Secondary | ICD-10-CM | POA: Diagnosis not present

## 2022-01-08 ENCOUNTER — Other Ambulatory Visit: Payer: Self-pay | Admitting: Family Medicine

## 2022-01-08 DIAGNOSIS — E781 Pure hyperglyceridemia: Secondary | ICD-10-CM

## 2022-01-08 DIAGNOSIS — K219 Gastro-esophageal reflux disease without esophagitis: Secondary | ICD-10-CM

## 2022-01-08 DIAGNOSIS — I10 Essential (primary) hypertension: Secondary | ICD-10-CM

## 2022-01-08 DIAGNOSIS — E1159 Type 2 diabetes mellitus with other circulatory complications: Secondary | ICD-10-CM

## 2022-01-13 ENCOUNTER — Encounter: Payer: Self-pay | Admitting: Family Medicine

## 2022-01-13 ENCOUNTER — Ambulatory Visit: Payer: Medicaid Other | Admitting: Family Medicine

## 2022-01-13 VITALS — BP 133/76 | HR 90 | Ht 68.0 in | Wt 143.0 lb

## 2022-01-13 DIAGNOSIS — N1831 Chronic kidney disease, stage 3a: Secondary | ICD-10-CM

## 2022-01-13 DIAGNOSIS — K219 Gastro-esophageal reflux disease without esophagitis: Secondary | ICD-10-CM

## 2022-01-13 DIAGNOSIS — E1159 Type 2 diabetes mellitus with other circulatory complications: Secondary | ICD-10-CM | POA: Diagnosis not present

## 2022-01-13 DIAGNOSIS — N183 Chronic kidney disease, stage 3 unspecified: Secondary | ICD-10-CM | POA: Diagnosis not present

## 2022-01-13 DIAGNOSIS — I10 Essential (primary) hypertension: Secondary | ICD-10-CM

## 2022-01-13 DIAGNOSIS — E781 Pure hyperglyceridemia: Secondary | ICD-10-CM

## 2022-01-13 DIAGNOSIS — I152 Hypertension secondary to endocrine disorders: Secondary | ICD-10-CM | POA: Diagnosis not present

## 2022-01-13 DIAGNOSIS — E1122 Type 2 diabetes mellitus with diabetic chronic kidney disease: Secondary | ICD-10-CM

## 2022-01-13 LAB — BAYER DCA HB A1C WAIVED: HB A1C (BAYER DCA - WAIVED): 6 % — ABNORMAL HIGH (ref 4.8–5.6)

## 2022-01-13 MED ORDER — CARVEDILOL 12.5 MG PO TABS: ORAL_TABLET | ORAL | 3 refills | Status: DC

## 2022-01-13 MED ORDER — ESOMEPRAZOLE MAGNESIUM 20 MG PO CPDR: DELAYED_RELEASE_CAPSULE | ORAL | 3 refills | Status: DC

## 2022-01-13 MED ORDER — ATORVASTATIN CALCIUM 40 MG PO TABS: 40.0000 mg | ORAL_TABLET | Freq: Every day | ORAL | 3 refills | Status: DC

## 2022-01-13 NOTE — Progress Notes (Signed)
? ?BP 133/76   Pulse 90   Ht _0  (1.727 m)   Wt 143 lb (64.9 kg)   SpO2 100%   BMI 21.74 kg/m?   ? ?Subjective:  ? ?Patient ID: Anthony Price, male    DOB: 06/10/1956, 66 y.o.   MRN: [id number] ? ?HPI: ?Anthony Price is a 66 y.o. male presenting on 01/13/2022 for Medical Management of Chronic Issues, Diabetes, and Hypertension ? ? ?HPI ?Type 2 diabetes mellitus ?Patient comes in today for recheck of his diabetes. Patient has been currently taking metformin and Januvia. Patient is currently on an ACE inhibitor/ARB. Patient has not seen an ophthalmologist this year. Patient denies any issues with their feet. The symptom started onset as an adult hypertension and hypertriglyceridemia and CKD on ARE RELATED TO DM  ? ?Hypertension ?Patient is currently on amlodipine and carvedilol, and their blood pressure today is 133/76. Patient denies any lightheadedness or dizziness. Patient denies headaches, blurred vision, chest pains, shortness of breath, or weakness. Denies any side effects from medication and is content with current medication.  ? ?GERD ?Patient is currently on Nexium.  She denies any major symptoms or abdominal pain or belching or burping. She denies any blood in her stool or lightheadedness or dizziness.  ? ?Hypertriglyceridemia ?Patient is coming in for recheck of his hypertriglyceridemia. The patient is currently taking lipitor and fish oil. They deny any issues with myalgias or history of liver damage from it. They deny any focal numbness or weakness or chest pain.  ? ?Relevant past medical, surgical, family and social history reviewed and updated as indicated. Interim medical history since our last visit reviewed. ?Allergies and medications reviewed and updated. ? ?Review of Systems  ?Constitutional:  Negative for chills and fever.  ?Eyes:  Negative for visual disturbance.  ?Respiratory:  Negative for shortness of breath and wheezing.   ?Cardiovascular:  Negative for chest pain and leg  swelling.  ?Musculoskeletal:  Negative for back pain and gait problem.  ?Skin:  Negative for rash.  ?Neurological:  Negative for dizziness, weakness and light-headedness.  ?All other systems reviewed and are negative. ? ?Per HPI unless specifically indicated above ? ? ?Allergies as of 01/13/2022   ? ?   Reactions  ? Lactose Intolerance (gi)   ? bloating  ? Tramadol Nausea Only  ? ?  ? ?  ?Medication List  ?  ? ?  ? Accurate as of January 13, 2022 10:48 AM. If you have any questions, ask your nurse or doctor.  ?  ?  ? ?  ? ?Accu-Chek Aviva Plus w/Device Kit ?Test qd. DX E11.9 ?  ?Accu-Chek Softclix Lancets lancets ?Test once qd. DX E11.9 ?  ?acetaminophen 500 MG tablet ?Commonly known as: TYLENOL ?Take 1,000 mg by mouth every 6 (six) hours as needed for moderate pain. Pain ?  ?ALPRAZolam 1 MG tablet ?Commonly known as: Duanne Moron ?TAKE (1) TABLET BY MOUTH THREE TIMES DAILY. ?  ?amLODipine 2.5 MG tablet ?Commonly known as: NORVASC ?Take 1 tablet (2.5 mg total) by mouth daily. ?  ?aspirin EC 81 MG tablet ?Take 81 mg by mouth daily. ?  ?atorvastatin 40 MG tablet ?Commonly known as: LIPITOR ?Take 1 tablet (40 mg total) by mouth daily. ?  ?buPROPion 300 MG 24 hr tablet ?Commonly known as: WELLBUTRIN XL ?TAKE 1 TABLET BY MOUTH ONCE EVERY MORNING. ?  ?carvedilol 12.5 MG tablet ?Commonly known as: COREG ?TAKE (1) TABLET BY MOUTH TWICE DAILY. ?  ?esomeprazole 20 MG capsule ?Commonly  known as: NEXIUM ?TAKE (1) CAPSULEBY MOUTH TWICE DAILY BEFORE A MEAL ?  ?glucose blood test strip ?Commonly known as: Accu-Chek Aviva Plus ?Test once qd. DX E11.9 ?  ?metFORMIN 500 MG tablet ?Commonly known as: GLUCOPHAGE ?Take by mouth daily. ?What changed: Another medication with the same name was removed. Continue taking this medication, and follow the directions you see here. ?Changed by: [name], MD ?  ?mycophenolate 500 MG tablet ?Commonly known as: CELLCEPT ?Take 500-1,000 mg by mouth See admin instructions. Take 500 mg by mouth in the  morning and 1000 mg at night ?  ?Omega-3 1000 MG Caps ?Take 1,000 mg by mouth in the morning and at bedtime. ?  ?PEN NEEDLES 31GX5/16" 31G X 8 MM Misc ?1 each by Does not apply route daily. ?  ?PROBIOTIC DAILY PO ?Take 1 capsule by mouth daily. ?  ?ramipril 2.5 MG capsule ?Commonly known as: ALTACE ?Take 2.5 mg by mouth daily. ?  ?sildenafil 20 MG tablet ?Commonly known as: REVATIO ?Take 2-3 tablets (40-60 mg total) by mouth as needed. ?  ?sitaGLIPtin 50 MG tablet ?Commonly known as: JANUVIA ?Take 1 tablet by mouth daily. ?  ?tacrolimus 0.5 MG capsule ?Commonly known as: PROGRAF ?Take 0.5 mg by mouth 2 (two) times daily. ?  ?Vilazodone HCl 10 MG Tabs ?Commonly known as: Viibryd ?Take 1 tablet (10 mg total) by mouth daily. ?  ? ?  ? ? ? ?Objective:  ? ?BP 133/76   Pulse 90   Ht _0  (1.727 m)   Wt 143 lb (64.9 kg)   SpO2 100%   BMI 21.74 kg/m?   ?Wt Readings from Last 3 Encounters:  ?01/13/22 143 lb (64.9 kg)  ?10/14/21 139 lb (63 kg)  ?07/12/21 137 lb 4 oz (62.3 kg)  ?  ?Physical Exam ?Vitals and nursing note reviewed.  ?Constitutional:   ?   General: He is not in acute distress. ?   Appearance: He is well-developed. He is not diaphoretic.  ?Eyes:  ?   General: No scleral icterus. ?   Conjunctiva/sclera: Conjunctivae normal.  ?Neck:  ?   Thyroid: No thyromegaly.  ?Cardiovascular:  ?   Rate and Rhythm: Normal rate and regular rhythm.  ?   Heart sounds: Normal heart sounds. No murmur heard. ?Pulmonary:  ?   Effort: Pulmonary effort is normal. No respiratory distress.  ?   Breath sounds: Normal breath sounds. No wheezing.  ?Musculoskeletal:  ?   Cervical back: Neck supple.  ?Lymphadenopathy:  ?   Cervical: No cervical adenopathy.  ?Skin: ?   General: Skin is warm and dry.  ?   Findings: No rash.  ?Neurological:  ?   Mental Status: He is alert and oriented to person, place, and time.  ?   Coordination: Coordination normal.  ?Psychiatric:     ?   Behavior: Behavior normal.  ? ? ? ? ?Assessment & Plan:  ? ?Problem  List Items Addressed This Visit   ? ?  ? Cardiovascular and Mediastinum  ? Hypertension associated with diabetes ([place])  ? Relevant Medications  ? atorvastatin (LIPITOR) 40 MG tablet  ? carvedilol (COREG) 12.5 MG tablet  ? metFORMIN (GLUCOPHAGE) 500 MG tablet  ? sitaGLIPtin (JANUVIA) 50 MG tablet  ?  ? Digestive  ? GERD (gastroesophageal reflux disease)  ? Relevant Medications  ? esomeprazole (NEXIUM) 20 MG capsule  ?  ? Endocrine  ? Diabetes type 2, controlled ([place])  ? Relevant Medications  ? atorvastatin (LIPITOR) 40  MG tablet  ? metFORMIN (GLUCOPHAGE) 500 MG tablet  ? sitaGLIPtin (JANUVIA) 50 MG tablet  ?  ? Genitourinary  ? CKD (chronic kidney disease), stage III ([place]) - Primary  ? Relevant Orders  ? CBC with Differential/Platelet  ? CMP14+EGFR  ? Lipid panel  ? Bayer DCA Hb A1c Waived  ?  ? Other  ? Hypertriglyceridemia  ? Relevant Medications  ? atorvastatin (LIPITOR) 40 MG tablet  ? carvedilol (COREG) 12.5 MG tablet  ? Other Relevant Orders  ? CBC with Differential/Platelet  ? CMP14+EGFR  ? Lipid panel  ? Bayer DCA Hb A1c Waived  ? ?Other Visit Diagnoses   ? ? Essential hypertension      ? Relevant Medications  ? atorvastatin (LIPITOR) 40 MG tablet  ? carvedilol (COREG) 12.5 MG tablet  ? Other Relevant Orders  ? CBC with Differential/Platelet  ? CMP14+EGFR  ? Lipid panel  ? Bayer DCA Hb A1c Waived  ? ?  ?  ?A1c looks good at 6.0, no changes.  His blood pressure looks good today to, continue follow-up with cardiology.  We will continue to keep close eye on his renal function. ?Follow up plan: ?Return in about 3 months (around 04/15/2022), or if symptoms worsen or fail to improve, for Diabetes and CKD recheck. ? ?Counseling provided for all of the vaccine components ?Orders Placed This Encounter  ?Procedures  ? CBC with Differential/Platelet  ? CMP14+EGFR  ? Lipid panel  ? Bayer DCA Hb A1c Waived  ? ? ?Caryl Pina, MD ?[place] ?01/13/2022, 10:48 AM ? ? ? ? ?

## 2022-01-14 LAB — CBC WITH DIFFERENTIAL/PLATELET
Basophils Absolute: 0.1 10*3/uL (ref 0.0–0.2)
Basos: 1 %
EOS (ABSOLUTE): 0.1 10*3/uL (ref 0.0–0.4)
Eos: 2 %
Hematocrit: 41.4 % (ref 37.5–51.0)
Hemoglobin: 13.3 g/dL (ref 13.0–17.7)
Immature Grans (Abs): 0 10*3/uL (ref 0.0–0.1)
Immature Granulocytes: 0 %
Lymphocytes Absolute: 2.1 10*3/uL (ref 0.7–3.1)
Lymphs: 31 %
MCH: 26.4 pg — ABNORMAL LOW (ref 26.6–33.0)
MCHC: 32.1 g/dL (ref 31.5–35.7)
MCV: 82 fL (ref 79–97)
Monocytes Absolute: 0.6 10*3/uL (ref 0.1–0.9)
Monocytes: 8 %
Neutrophils Absolute: 4 10*3/uL (ref 1.4–7.0)
Neutrophils: 58 %
Platelets: 175 10*3/uL (ref 150–450)
RBC: 5.03 x10E6/uL (ref 4.14–5.80)
RDW: 13 % (ref 11.6–15.4)
WBC: 6.8 10*3/uL (ref 3.4–10.8)

## 2022-01-14 LAB — CMP14+EGFR
ALT: 12 IU/L (ref 0–44)
AST: 16 IU/L (ref 0–40)
Albumin/Globulin Ratio: 2.2 (ref 1.2–2.2)
Albumin: 4.6 g/dL (ref 3.8–4.8)
Alkaline Phosphatase: 79 IU/L (ref 44–121)
BUN/Creatinine Ratio: 13 (ref 10–24)
BUN: 23 mg/dL (ref 8–27)
Bilirubin Total: 0.4 mg/dL (ref 0.0–1.2)
CO2: 24 mmol/L (ref 20–29)
Calcium: 9.2 mg/dL (ref 8.6–10.2)
Chloride: 100 mmol/L (ref 96–106)
Creatinine, Ser: 1.75 mg/dL — ABNORMAL HIGH (ref 0.76–1.27)
Globulin, Total: 2.1 g/dL (ref 1.5–4.5)
Glucose: 223 mg/dL — ABNORMAL HIGH (ref 70–99)
Potassium: 5.1 mmol/L (ref 3.5–5.2)
Sodium: 136 mmol/L (ref 134–144)
Total Protein: 6.7 g/dL (ref 6.0–8.5)
eGFR: 43 mL/min/{1.73_m2} — ABNORMAL LOW (ref >59)

## 2022-01-14 LAB — LIPID PANEL
Chol/HDL Ratio: 4.3 ratio (ref 0.0–5.0)
Cholesterol, Total: 121 mg/dL (ref 100–199)
HDL: 28 mg/dL — ABNORMAL LOW (ref >39)
LDL Chol Calc (NIH): 49 mg/dL (ref 0–99)
Triglycerides: 285 mg/dL — ABNORMAL HIGH (ref 0–149)
VLDL Cholesterol Cal: 44 mg/dL — ABNORMAL HIGH (ref 5–40)

## 2022-02-28 ENCOUNTER — Encounter (HOSPITAL_COMMUNITY): Payer: Self-pay | Admitting: Psychiatry

## 2022-02-28 ENCOUNTER — Telehealth (INDEPENDENT_AMBULATORY_CARE_PROVIDER_SITE_OTHER): Payer: Medicaid Other | Admitting: Psychiatry

## 2022-02-28 DIAGNOSIS — F418 Other specified anxiety disorders: Secondary | ICD-10-CM

## 2022-02-28 DIAGNOSIS — F321 Major depressive disorder, single episode, moderate: Secondary | ICD-10-CM

## 2022-02-28 MED ORDER — BUPROPION HCL ER (XL) 300 MG PO TB24: ORAL_TABLET | ORAL | 2 refills | Status: DC

## 2022-02-28 MED ORDER — VILAZODONE HCL 10 MG PO TABS: 10.0000 mg | ORAL_TABLET | Freq: Every day | ORAL | 2 refills | Status: DC

## 2022-02-28 MED ORDER — ALPRAZOLAM 1 MG PO TABS: ORAL_TABLET | ORAL | 2 refills | Status: DC
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
## 2022-03-13 ENCOUNTER — Other Ambulatory Visit: Payer: Self-pay | Admitting: Family Medicine

## 2022-04-01 ENCOUNTER — Other Ambulatory Visit (HOSPITAL_COMMUNITY): Payer: Self-pay | Admitting: Psychiatry

## 2022-04-01 DIAGNOSIS — F321 Major depressive disorder, single episode, moderate: Secondary | ICD-10-CM

## 2022-04-01 MED ORDER — BUPROPION HCL ER (XL) 300 MG PO TB24: ORAL_TABLET | ORAL | 2 refills | Status: DC

## 2022-04-07 IMAGING — DX DG CHEST 2V
2 series · 2 of 2 positions shown · non-contrast
Comparison: 11/22/2013

CLINICAL DATA: Chest pain, left-sided back pain, anxiety

EXAM:
CHEST - 2 VIEW

[chest pa]
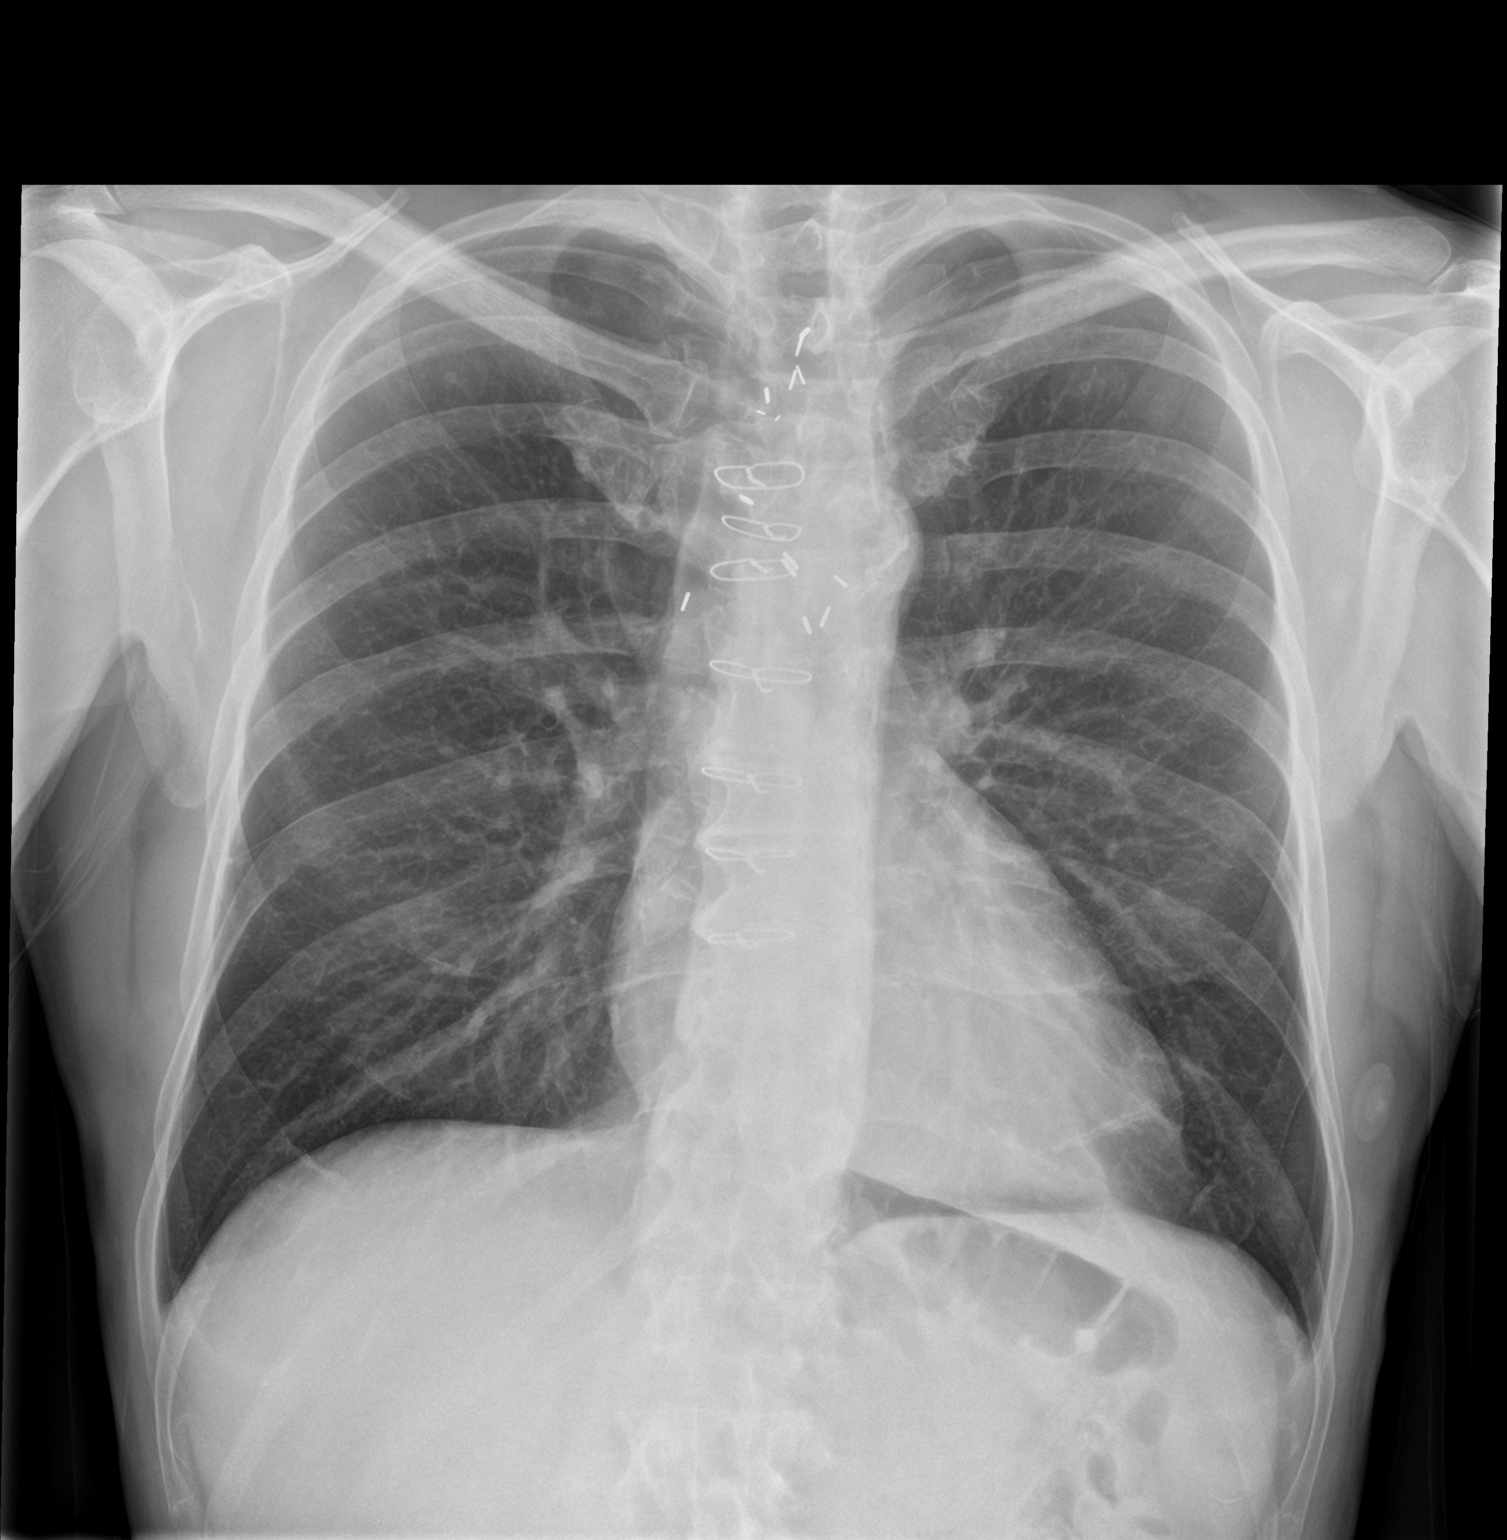

[chest lat]
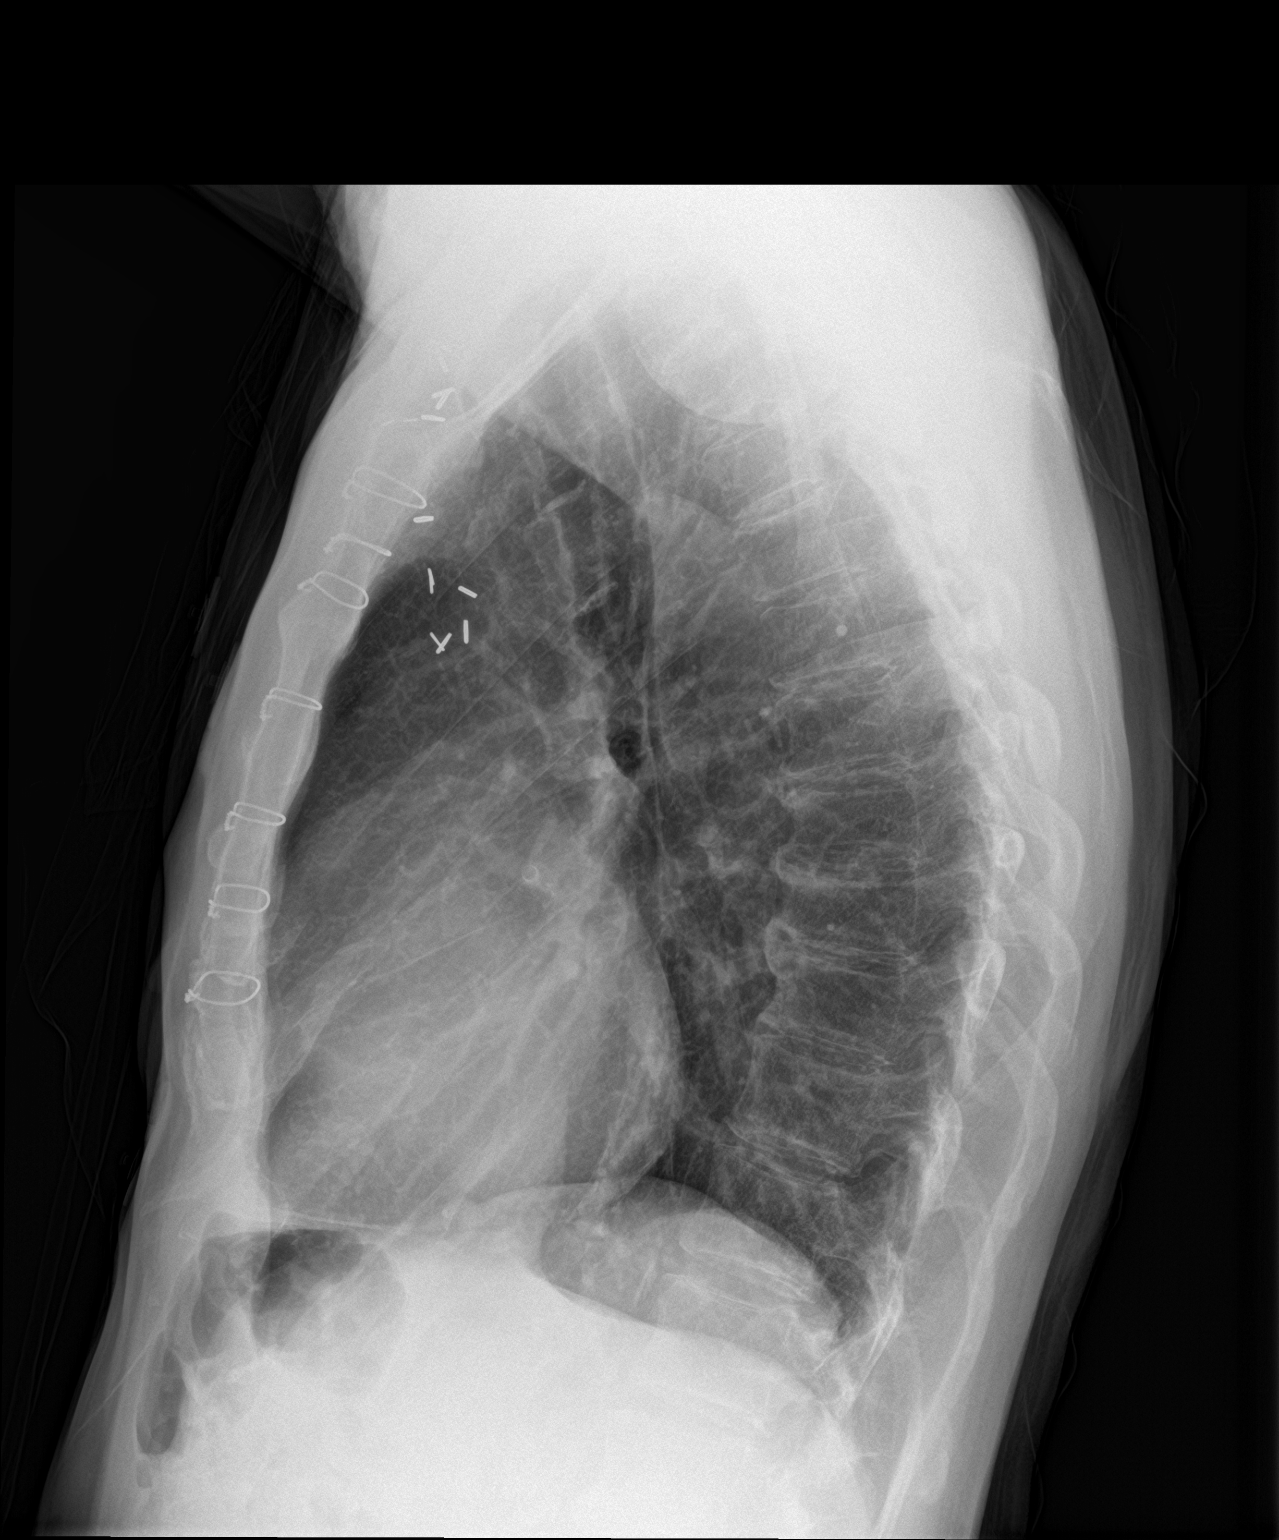

[2 of 2 positions shown; findings below may reference images not displayed]

FINDINGS: Frontal and lateral views of the chest demonstrate an unremarkable
cardiac silhouette. Postsurgical changes from median sternotomy. No
airspace disease, effusion, or pneumothorax. No acute bony
abnormalities.
IMPRESSION: 1. No acute intrathoracic process.

## 2022-04-08 DIAGNOSIS — E559 Vitamin D deficiency, unspecified: Secondary | ICD-10-CM | POA: Diagnosis not present

## 2022-04-08 DIAGNOSIS — Z48298 Encounter for aftercare following other organ transplant: Secondary | ICD-10-CM | POA: Diagnosis not present

## 2022-04-08 DIAGNOSIS — Z79899 Other long term (current) drug therapy: Secondary | ICD-10-CM | POA: Diagnosis not present

## 2022-04-08 DIAGNOSIS — E785 Hyperlipidemia, unspecified: Secondary | ICD-10-CM | POA: Diagnosis not present

## 2022-04-08 DIAGNOSIS — Z941 Heart transplant status: Secondary | ICD-10-CM | POA: Diagnosis not present

## 2022-04-08 DIAGNOSIS — R7989 Other specified abnormal findings of blood chemistry: Secondary | ICD-10-CM | POA: Diagnosis not present

## 2022-04-08 DIAGNOSIS — Z125 Encounter for screening for malignant neoplasm of prostate: Secondary | ICD-10-CM | POA: Diagnosis not present

## 2022-04-16 ENCOUNTER — Ambulatory Visit: Payer: Medicaid Other | Admitting: Family Medicine

## 2022-04-16 ENCOUNTER — Encounter: Payer: Self-pay | Admitting: Family Medicine

## 2022-04-16 VITALS — BP 119/67 | HR 88 | Temp 98.0°F | Ht 68.0 in | Wt 137.0 lb

## 2022-04-16 DIAGNOSIS — N183 Chronic kidney disease, stage 3 unspecified: Secondary | ICD-10-CM | POA: Diagnosis not present

## 2022-04-16 DIAGNOSIS — I152 Hypertension secondary to endocrine disorders: Secondary | ICD-10-CM

## 2022-04-16 DIAGNOSIS — N182 Chronic kidney disease, stage 2 (mild): Secondary | ICD-10-CM

## 2022-04-16 DIAGNOSIS — E781 Pure hyperglyceridemia: Secondary | ICD-10-CM | POA: Diagnosis not present

## 2022-04-16 DIAGNOSIS — N529 Male erectile dysfunction, unspecified: Secondary | ICD-10-CM

## 2022-04-16 DIAGNOSIS — E1159 Type 2 diabetes mellitus with other circulatory complications: Secondary | ICD-10-CM | POA: Diagnosis not present

## 2022-04-16 DIAGNOSIS — I10 Essential (primary) hypertension: Secondary | ICD-10-CM | POA: Diagnosis not present

## 2022-04-16 DIAGNOSIS — N1831 Chronic kidney disease, stage 3a: Secondary | ICD-10-CM | POA: Diagnosis not present

## 2022-04-16 DIAGNOSIS — E1122 Type 2 diabetes mellitus with diabetic chronic kidney disease: Secondary | ICD-10-CM | POA: Diagnosis not present

## 2022-04-16 MED ORDER — SILDENAFIL CITRATE 20 MG PO TABS
40.0000 mg | ORAL_TABLET | ORAL | 2 refills | Status: DC | PRN
Start: 2022-04-16 — End: 2022-07-18

## 2022-04-16 MED ORDER — "PEN NEEDLES 5/16"" 31G X 8 MM MISC": 1.0000 | Freq: Every day | 3 refills | Status: DC

## 2022-04-16 MED ORDER — AMLODIPINE BESYLATE 2.5 MG PO TABS: 2.5000 mg | ORAL_TABLET | Freq: Every day | ORAL | 3 refills | Status: DC

## 2022-04-16 NOTE — Progress Notes (Signed)
BP 119/67   Pulse 88   Temp 98 F (36.7 C)   Ht '5\' 8"'  (1.727 m)   Wt 137 lb (62.1 kg)   SpO2 100%   BMI 20.83 kg/m    Subjective:   Patient ID: Anthony Price, male    DOB: Jan 17, 1956, 66 y.o.   MRN: [id number]  HPI: Anthony Price is a 66 y.o. male presenting on 04/16/2022 for Medical Management of Chronic Issues, Diabetes, Chronic Kidney Disease (/), and Hyperlipidemia   HPI Type 2 diabetes mellitus Patient comes in today for recheck of his diabetes. Patient has been currently taking Januvia. Patient is currently on an ACE inhibitor/ARB. Patient has seen an ophthalmologist this year. Patient denies any issues with their feet. The symptom started onset as an adult hypertension and hypertriglyceridemia ARE RELATED TO DM   Hypertension Patient is currently on amlodipine and carvedilol, and their blood pressure today is 119/67. Patient denies any lightheadedness or dizziness. Patient denies headaches, blurred vision, chest pains, shortness of breath, or weakness. Denies any side effects from medication and is content with current medication.   Hyperlipidemia and hypertriglyceridemia Patient is coming in for recheck of his hyperlipidemia. The patient is currently taking atorvastatin. They deny any issues with myalgias or history of liver damage from it. They deny any focal numbness or weakness or chest pain.   Relevant past medical, surgical, family and social history reviewed and updated as indicated. Interim medical history since our last visit reviewed. Allergies and medications reviewed and updated.  Review of Systems  Constitutional:  Negative for chills and fever.  Eyes:  Negative for visual disturbance.  Respiratory:  Negative for shortness of breath and wheezing.   Cardiovascular:  Negative for chest pain and leg swelling.  Musculoskeletal:  Negative for back pain and gait problem.  Skin:  Negative for rash.  Neurological:  Negative for dizziness, weakness and  light-headedness.  All other systems reviewed and are negative.   Per HPI unless specifically indicated above   Allergies as of 04/16/2022       Reactions   Lactose Intolerance (gi)    bloating   Tramadol Nausea Only        Medication List        Accurate as of April 16, 2022 11:34 AM. If you have any questions, ask your nurse or doctor.          Accu-Chek Aviva Plus w/Device Kit Test qd. DX E11.9   Accu-Chek Softclix Lancets lancets Test once qd. DX E11.9   acetaminophen 500 MG tablet Commonly known as: TYLENOL Take 1,000 mg by mouth every 6 (six) hours as needed for moderate pain. Pain   ALPRAZolam 1 MG tablet Commonly known as: XANAX TAKE (1) TABLET BY MOUTH THREE TIMES DAILY.   amLODipine 2.5 MG tablet Commonly known as: NORVASC Take 1 tablet (2.5 mg total) by mouth daily.   aspirin EC 81 MG tablet Take 81 mg by mouth daily.   atorvastatin 40 MG tablet Commonly known as: LIPITOR Take 1 tablet (40 mg total) by mouth daily.   buPROPion 300 MG 24 hr tablet Commonly known as: WELLBUTRIN XL TAKE 1 TABLET BY MOUTH ONCE EVERY MORNING.   carvedilol 12.5 MG tablet Commonly known as: COREG TAKE (1) TABLET BY MOUTH TWICE DAILY.   esomeprazole 20 MG capsule Commonly known as: NEXIUM TAKE (1) CAPSULEBY MOUTH TWICE DAILY BEFORE A MEAL   glucose blood test strip Commonly known as: Accu-Chek Aviva Plus Test once qd.  DX E11.9   metFORMIN 500 MG tablet Commonly known as: GLUCOPHAGE Take by mouth daily.   mycophenolate 500 MG tablet Commonly known as: CELLCEPT Take 500-1,000 mg by mouth See admin instructions. Take 500 mg by mouth in the morning and 1000 mg at night   Omega-3 1000 MG Caps Take 1,000 mg by mouth in the morning and at bedtime.   PEN NEEDLES 31GX5/16" 31G X 8 MM Misc 1 each by Does not apply route daily.   PROBIOTIC DAILY PO Take 1 capsule by mouth daily.   ramipril 2.5 MG capsule Commonly known as: ALTACE Take 2.5 mg by mouth  daily.   sildenafil 20 MG tablet Commonly known as: REVATIO Take 2-3 tablets (40-60 mg total) by mouth as needed.   sitaGLIPtin 50 MG tablet Commonly known as: JANUVIA Take 1 tablet by mouth daily.   tacrolimus 0.5 MG capsule Commonly known as: PROGRAF Take 0.5 mg by mouth 2 (two) times daily.   Vilazodone HCl 10 MG Tabs Commonly known as: Viibryd Take 1 tablet (10 mg total) by mouth daily.         Objective:   BP 119/67   Pulse 88   Temp 98 F (36.7 C)   Ht '5\' 8"'  (1.727 m)   Wt 137 lb (62.1 kg)   SpO2 100%   BMI 20.83 kg/m   Wt Readings from Last 3 Encounters:  04/16/22 137 lb (62.1 kg)  01/13/22 143 lb (64.9 kg)  10/14/21 139 lb (63 kg)    Physical Exam Vitals and nursing note reviewed.  Constitutional:      General: He is not in acute distress.    Appearance: He is well-developed. He is not diaphoretic.  Eyes:     General: No scleral icterus.    Conjunctiva/sclera: Conjunctivae normal.  Neck:     Thyroid: No thyromegaly.  Cardiovascular:     Rate and Rhythm: Normal rate and regular rhythm.     Heart sounds: Normal heart sounds. No murmur heard. Pulmonary:     Effort: Pulmonary effort is normal. No respiratory distress.     Breath sounds: Normal breath sounds. No wheezing.  Musculoskeletal:        General: No swelling. Normal range of motion.     Cervical back: Neck supple.  Lymphadenopathy:     Cervical: No cervical adenopathy.  Skin:    General: Skin is warm and dry.     Findings: No rash.  Neurological:     Mental Status: He is alert and oriented to person, place, and time.     Coordination: Coordination normal.  Psychiatric:        Behavior: Behavior normal.       Assessment & Plan:   Problem List Items Addressed This Visit       Cardiovascular and Mediastinum   Hypertension associated with diabetes ([place])   Relevant Medications   amLODipine (NORVASC) 2.5 MG tablet   sildenafil (REVATIO) 20 MG tablet     Endocrine   Diabetes  type 2, controlled (HCC)   Relevant Medications   Insulin Pen Needle (PEN NEEDLES 31GX5/16") 31G X 8 MM MISC   Other Relevant Orders   CBC with Differential/Platelet   CMP14+EGFR   Lipid panel   Bayer DCA Hb A1c Waived     Genitourinary   CKD (chronic kidney disease), stage III (HCC) - Primary   Relevant Orders   CBC with Differential/Platelet   CMP14+EGFR   Lipid panel   Bayer DCA Hb A1c  Waived     Other   Hypertriglyceridemia   Relevant Medications   amLODipine (NORVASC) 2.5 MG tablet   sildenafil (REVATIO) 20 MG tablet   Other Visit Diagnoses     Essential hypertension       Relevant Medications   amLODipine (NORVASC) 2.5 MG tablet   sildenafil (REVATIO) 20 MG tablet   Other Relevant Orders   CBC with Differential/Platelet   CMP14+EGFR   Lipid panel   Bayer DCA Hb A1c Waived   Erectile dysfunction, unspecified erectile dysfunction type       Relevant Medications   sildenafil (REVATIO) 20 MG tablet       A1c is 6.7 and looks good at all labs.  No changes.  The rest of his blood work looks good including triglycerides at 167.  He had these labs done at [facility] per Follow up plan: Return in about 3 months (around 07/17/2022), or if symptoms worsen or fail to improve, for Diabetes and hypertension and cholesterol.  Counseling provided for all of the vaccine components Orders Placed This Encounter  Procedures   CBC with Differential/Platelet   CMP14+EGFR   Lipid panel   Bayer DCA Hb A1c Waived    Caryl Pina, MD [facility] 04/16/2022, 11:34 AM

## 2022-04-22 DIAGNOSIS — F32A Depression, unspecified: Secondary | ICD-10-CM | POA: Diagnosis not present

## 2022-04-22 DIAGNOSIS — Z7984 Long term (current) use of oral hypoglycemic drugs: Secondary | ICD-10-CM | POA: Diagnosis not present

## 2022-04-22 DIAGNOSIS — Z4821 Encounter for aftercare following heart transplant: Secondary | ICD-10-CM | POA: Diagnosis not present

## 2022-04-22 DIAGNOSIS — K439 Ventral hernia without obstruction or gangrene: Secondary | ICD-10-CM | POA: Diagnosis not present

## 2022-04-22 DIAGNOSIS — M549 Dorsalgia, unspecified: Secondary | ICD-10-CM | POA: Diagnosis not present

## 2022-04-22 DIAGNOSIS — Z888 Allergy status to other drugs, medicaments and biological substances status: Secondary | ICD-10-CM | POA: Diagnosis not present

## 2022-04-22 DIAGNOSIS — E785 Hyperlipidemia, unspecified: Secondary | ICD-10-CM | POA: Diagnosis not present

## 2022-04-22 DIAGNOSIS — N1831 Chronic kidney disease, stage 3a: Secondary | ICD-10-CM | POA: Diagnosis not present

## 2022-04-22 DIAGNOSIS — K219 Gastro-esophageal reflux disease without esophagitis: Secondary | ICD-10-CM | POA: Diagnosis not present

## 2022-04-22 DIAGNOSIS — E7800 Pure hypercholesterolemia, unspecified: Secondary | ICD-10-CM | POA: Diagnosis not present

## 2022-04-22 DIAGNOSIS — Z885 Allergy status to narcotic agent status: Secondary | ICD-10-CM | POA: Diagnosis not present

## 2022-04-22 DIAGNOSIS — F419 Anxiety disorder, unspecified: Secondary | ICD-10-CM | POA: Diagnosis not present

## 2022-04-22 DIAGNOSIS — Z941 Heart transplant status: Secondary | ICD-10-CM | POA: Diagnosis not present

## 2022-04-22 DIAGNOSIS — I129 Hypertensive chronic kidney disease with stage 1 through stage 4 chronic kidney disease, or unspecified chronic kidney disease: Secondary | ICD-10-CM | POA: Diagnosis not present

## 2022-04-22 DIAGNOSIS — E119 Type 2 diabetes mellitus without complications: Secondary | ICD-10-CM | POA: Diagnosis not present

## 2022-04-22 DIAGNOSIS — G8929 Other chronic pain: Secondary | ICD-10-CM | POA: Diagnosis not present

## 2022-04-22 DIAGNOSIS — Z7982 Long term (current) use of aspirin: Secondary | ICD-10-CM | POA: Diagnosis not present

## 2022-04-22 DIAGNOSIS — E1122 Type 2 diabetes mellitus with diabetic chronic kidney disease: Secondary | ICD-10-CM | POA: Diagnosis not present

## 2022-04-22 DIAGNOSIS — N183 Chronic kidney disease, stage 3 unspecified: Secondary | ICD-10-CM | POA: Diagnosis not present

## 2022-04-22 DIAGNOSIS — Z79899 Other long term (current) drug therapy: Secondary | ICD-10-CM | POA: Diagnosis not present

## 2022-04-22 DIAGNOSIS — I10 Essential (primary) hypertension: Secondary | ICD-10-CM | POA: Diagnosis not present

## 2022-04-23 DIAGNOSIS — Z941 Heart transplant status: Secondary | ICD-10-CM | POA: Diagnosis not present

## 2022-05-08 DIAGNOSIS — N183 Chronic kidney disease, stage 3 unspecified: Secondary | ICD-10-CM | POA: Diagnosis not present

## 2022-05-08 DIAGNOSIS — I10 Essential (primary) hypertension: Secondary | ICD-10-CM | POA: Diagnosis not present

## 2022-05-08 DIAGNOSIS — Z941 Heart transplant status: Secondary | ICD-10-CM | POA: Diagnosis not present

## 2022-05-08 DIAGNOSIS — R0989 Other specified symptoms and signs involving the circulatory and respiratory systems: Secondary | ICD-10-CM | POA: Diagnosis not present

## 2022-05-15 ENCOUNTER — Other Ambulatory Visit (HOSPITAL_COMMUNITY): Payer: Self-pay | Admitting: Psychiatry

## 2022-06-20 ENCOUNTER — Telehealth (INDEPENDENT_AMBULATORY_CARE_PROVIDER_SITE_OTHER): Payer: Medicaid Other | Admitting: Psychiatry

## 2022-06-20 ENCOUNTER — Encounter (HOSPITAL_COMMUNITY): Payer: Self-pay | Admitting: Psychiatry

## 2022-06-20 DIAGNOSIS — F321 Major depressive disorder, single episode, moderate: Secondary | ICD-10-CM

## 2022-06-20 DIAGNOSIS — F418 Other specified anxiety disorders: Secondary | ICD-10-CM | POA: Diagnosis not present

## 2022-06-20 MED ORDER — ALPRAZOLAM 1 MG PO TABS: ORAL_TABLET | ORAL | 0 refills | Status: DC

## 2022-06-20 MED ORDER — BUPROPION HCL ER (XL) 300 MG PO TB24: ORAL_TABLET | ORAL | 2 refills | Status: DC

## 2022-06-20 MED ORDER — VILAZODONE HCL 10 MG PO TABS: 10.0000 mg | ORAL_TABLET | Freq: Every day | ORAL | 2 refills | Status: DC
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
## 2022-07-18 ENCOUNTER — Encounter: Payer: Self-pay | Admitting: Family Medicine

## 2022-07-18 ENCOUNTER — Ambulatory Visit: Payer: Medicaid Other | Admitting: Family Medicine

## 2022-07-18 VITALS — BP 135/78 | HR 86 | Temp 97.5°F | Ht 68.0 in | Wt 133.0 lb

## 2022-07-18 DIAGNOSIS — E1122 Type 2 diabetes mellitus with diabetic chronic kidney disease: Secondary | ICD-10-CM

## 2022-07-18 DIAGNOSIS — N1831 Chronic kidney disease, stage 3a: Secondary | ICD-10-CM

## 2022-07-18 DIAGNOSIS — Z23 Encounter for immunization: Secondary | ICD-10-CM | POA: Diagnosis not present

## 2022-07-18 DIAGNOSIS — E1159 Type 2 diabetes mellitus with other circulatory complications: Secondary | ICD-10-CM | POA: Diagnosis not present

## 2022-07-18 DIAGNOSIS — N183 Chronic kidney disease, stage 3 unspecified: Secondary | ICD-10-CM | POA: Diagnosis not present

## 2022-07-18 DIAGNOSIS — E781 Pure hyperglyceridemia: Secondary | ICD-10-CM | POA: Diagnosis not present

## 2022-07-18 DIAGNOSIS — I152 Hypertension secondary to endocrine disorders: Secondary | ICD-10-CM

## 2022-07-18 LAB — BAYER DCA HB A1C WAIVED: HB A1C (BAYER DCA - WAIVED): 6.3 % — ABNORMAL HIGH (ref 4.8–5.6)

## 2022-07-18 MED ORDER — AMLODIPINE BESYLATE 2.5 MG PO TABS: 2.5000 mg | ORAL_TABLET | Freq: Every day | ORAL | 3 refills | Status: DC

## 2022-07-18 NOTE — Progress Notes (Signed)
BP 135/78   Pulse 86   Temp (!) 97.5 F (36.4 C)   Ht _0  (1.727 m)   Wt 133 lb (60.3 kg)   SpO2 100%   BMI 20.22 kg/m    Subjective:   Patient ID: Anthony Price, male    DOB: 08-06-1956, 66 y.o.   MRN: [id number]  HPI: Anthony Price is a 66 y.o. male presenting on 07/18/2022 for Medical Management of Chronic Issues, Diabetes, Chronic Kidney Disease, Hypertension, and Hyperlipidemia   HPI Type 2 diabetes mellitus Patient comes in today for recheck of his diabetes. Patient has been currently taking metformin and Januvia. Patient is currently on an ACE inhibitor/ARB. Patient has seen an ophthalmologist this year. Patient denies any issues with their feet. The symptom started onset as an adult CKD and hypertension and hyperlipidemia ARE RELATED TO DM   Hypertension and CKD 3 Patient is currently on amlodipine and carvedilol and ramipril, and their blood pressure today is 134/78. Patient has occasional lightheadedness or dizziness. Patient denies headaches, blurred vision, chest pains, shortness of breath, or weakness. Denies any side effects from medication and is content with current medication.   Hyperlipidemia and hypertriglyceridemia Patient is coming in for recheck of his hyperlipidemia. The patient is currently taking atorvastatin. They deny any issues with myalgias or history of liver damage from it. They deny any focal numbness or weakness or chest pain.   Relevant past medical, surgical, family and social history reviewed and updated as indicated. Interim medical history since our last visit reviewed. Allergies and medications reviewed and updated.  Review of Systems  Constitutional:  Negative for chills and fever.  Eyes:  Negative for visual disturbance.  Respiratory:  Negative for shortness of breath and wheezing.   Cardiovascular:  Negative for chest pain and leg swelling.  Musculoskeletal:  Negative for back pain and gait problem.  Skin:  Negative for rash.   Neurological:  Negative for dizziness.  All other systems reviewed and are negative.   Per HPI unless specifically indicated above   Allergies as of 07/18/2022       Reactions   Lactose Intolerance (gi)    bloating   Tramadol Nausea Only        Medication List        Accurate as of July 18, 2022 10:55 AM. If you have any questions, ask your nurse or doctor.          STOP taking these medications    sildenafil 20 MG tablet Commonly known as: REVATIO Stopped by: [name], MD       TAKE these medications    Accu-Chek Aviva Plus w/Device Kit Test qd. DX E11.9   Accu-Chek Softclix Lancets lancets Test once qd. DX E11.9   acetaminophen 500 MG tablet Commonly known as: TYLENOL Take 1,000 mg by mouth every 6 (six) hours as needed for moderate pain. Pain   ALPRAZolam 1 MG tablet Commonly known as: XANAX TAKE (1) TABLET BY MOUTH THREE TIMES DAILY.   amLODipine 2.5 MG tablet Commonly known as: NORVASC Take 1 tablet (2.5 mg total) by mouth daily.   aspirin EC 81 MG tablet Take 81 mg by mouth daily.   atorvastatin 40 MG tablet Commonly known as: LIPITOR Take 1 tablet (40 mg total) by mouth daily.   buPROPion 300 MG 24 hr tablet Commonly known as: WELLBUTRIN XL TAKE 1 TABLET BY MOUTH ONCE EVERY MORNING.   carvedilol 12.5 MG tablet Commonly known as: COREG TAKE (  1) TABLET BY MOUTH TWICE DAILY.   esomeprazole 20 MG capsule Commonly known as: NEXIUM TAKE (1) CAPSULEBY MOUTH TWICE DAILY BEFORE A MEAL   glucose blood test strip Commonly known as: Accu-Chek Aviva Plus Test once qd. DX E11.9   metFORMIN 500 MG tablet Commonly known as: GLUCOPHAGE Take by mouth daily.   mycophenolate 500 MG tablet Commonly known as: CELLCEPT Take 500-1,000 mg by mouth See admin instructions. Take 500 mg by mouth in the morning and 1000 mg at night   Omega-3 1000 MG Caps Take 1,000 mg by mouth in the morning and at bedtime.   PEN NEEDLES 31GX5/16" 31G  X 8 MM Misc 1 each by Does not apply route daily.   PROBIOTIC DAILY PO Take 1 capsule by mouth daily.   ramipril 2.5 MG capsule Commonly known as: ALTACE Take 2.5 mg by mouth daily.   sitaGLIPtin 50 MG tablet Commonly known as: JANUVIA Take 1 tablet by mouth daily.   tacrolimus 0.5 MG capsule Commonly known as: PROGRAF Take 0.5 mg by mouth 2 (two) times daily.   Vilazodone HCl 10 MG Tabs Commonly known as: VIIBRYD Take 1 tablet (10 mg total) by mouth daily.         Objective:   BP 135/78   Pulse 86   Temp (!) 97.5 F (36.4 C)   Ht _0  (1.727 m)   Wt 133 lb (60.3 kg)   SpO2 100%   BMI 20.22 kg/m   Wt Readings from Last 3 Encounters:  07/18/22 133 lb (60.3 kg)  04/16/22 137 lb (62.1 kg)  01/13/22 143 lb (64.9 kg)    Physical Exam Vitals and nursing note reviewed.  Constitutional:      General: He is not in acute distress.    Appearance: He is well-developed. He is not diaphoretic.  Eyes:     General: No scleral icterus.    Conjunctiva/sclera: Conjunctivae normal.  Neck:     Thyroid: No thyromegaly.  Cardiovascular:     Rate and Rhythm: Normal rate and regular rhythm.     Heart sounds: Normal heart sounds. No murmur heard. Pulmonary:     Effort: Pulmonary effort is normal. No respiratory distress.     Breath sounds: Normal breath sounds. No wheezing.  Musculoskeletal:        General: Normal range of motion.     Cervical back: Neck supple.  Lymphadenopathy:     Cervical: No cervical adenopathy.  Skin:    General: Skin is warm and dry.     Findings: No rash.  Neurological:     Mental Status: He is alert and oriented to person, place, and time.     Coordination: Coordination normal.  Psychiatric:        Behavior: Behavior normal.       Assessment & Plan:   Problem List Items Addressed This Visit       Cardiovascular and Mediastinum   Hypertension associated with diabetes ([place])   Relevant Medications   amLODipine (NORVASC) 2.5 MG tablet      Endocrine   Diabetes type 2, controlled (HCC)   Relevant Orders   CBC with Differential/Platelet   CMP14+EGFR   Lipid panel   Bayer DCA Hb A1c Waived     Genitourinary   CKD (chronic kidney disease), stage III (HCC) - Primary   Relevant Orders   CBC with Differential/Platelet   CMP14+EGFR   Lipid panel   Bayer DCA Hb A1c Waived     Other  Hypertriglyceridemia   Relevant Medications   amLODipine (NORVASC) 2.5 MG tablet    A1c looks good, he was unloading dizziness and had increased his amlodipine back to 5 mg because it was too hard to cut, certainly prescription for amlodipine 2.5 mg.  A1c was 6.3. Follow up plan: Return in about 3 months (around 10/17/2022), or if symptoms worsen or fail to improve, for Diabetes recheck.  Counseling provided for all of the vaccine components Orders Placed This Encounter  Procedures   CBC with Differential/Platelet   CMP14+EGFR   Lipid panel   Bayer DCA Hb A1c Waived    Caryl Pina, MD [facility] 07/18/2022, 10:55 AM

## 2022-07-19 LAB — CBC WITH DIFFERENTIAL/PLATELET
Basophils Absolute: 0.1 10*3/uL (ref 0.0–0.2)
Basos: 1 %
EOS (ABSOLUTE): 0.1 10*3/uL (ref 0.0–0.4)
Eos: 1 %
Hematocrit: 42.7 % (ref 37.5–51.0)
Hemoglobin: 13.3 g/dL (ref 13.0–17.7)
Immature Grans (Abs): 0 10*3/uL (ref 0.0–0.1)
Immature Granulocytes: 0 %
Lymphocytes Absolute: 2.6 10*3/uL (ref 0.7–3.1)
Lymphs: 40 %
MCH: 25.1 pg — ABNORMAL LOW (ref 26.6–33.0)
MCHC: 31.1 g/dL — ABNORMAL LOW (ref 31.5–35.7)
MCV: 81 fL (ref 79–97)
Monocytes Absolute: 0.6 10*3/uL (ref 0.1–0.9)
Monocytes: 10 %
Neutrophils Absolute: 3.2 10*3/uL (ref 1.4–7.0)
Neutrophils: 48 %
Platelets: 153 10*3/uL (ref 150–450)
RBC: 5.29 x10E6/uL (ref 4.14–5.80)
RDW: 13.8 % (ref 11.6–15.4)
WBC: 6.6 10*3/uL (ref 3.4–10.8)

## 2022-07-19 LAB — LIPID PANEL
Chol/HDL Ratio: 3.8 ratio (ref 0.0–5.0)
Cholesterol, Total: 109 mg/dL (ref 100–199)
HDL: 29 mg/dL — ABNORMAL LOW (ref >39)
LDL Chol Calc (NIH): 54 mg/dL (ref 0–99)
Triglycerides: 148 mg/dL (ref 0–149)
VLDL Cholesterol Cal: 26 mg/dL (ref 5–40)

## 2022-07-19 LAB — CMP14+EGFR
ALT: 8 IU/L (ref 0–44)
AST: 15 IU/L (ref 0–40)
Albumin/Globulin Ratio: 1.9 (ref 1.2–2.2)
Albumin: 4.8 g/dL (ref 3.9–4.9)
Alkaline Phosphatase: 62 IU/L (ref 44–121)
BUN/Creatinine Ratio: 10 (ref 10–24)
BUN: 19 mg/dL (ref 8–27)
Bilirubin Total: 0.4 mg/dL (ref 0.0–1.2)
CO2: 20 mmol/L (ref 20–29)
Calcium: 9.6 mg/dL (ref 8.6–10.2)
Chloride: 101 mmol/L (ref 96–106)
Creatinine, Ser: 1.81 mg/dL — ABNORMAL HIGH (ref 0.76–1.27)
Globulin, Total: 2.5 g/dL (ref 1.5–4.5)
Glucose: 127 mg/dL — ABNORMAL HIGH (ref 70–99)
Potassium: 4.4 mmol/L (ref 3.5–5.2)
Sodium: 139 mmol/L (ref 134–144)
Total Protein: 7.3 g/dL (ref 6.0–8.5)
eGFR: 41 mL/min/{1.73_m2} — ABNORMAL LOW (ref >59)

## 2022-07-30 DIAGNOSIS — R7989 Other specified abnormal findings of blood chemistry: Secondary | ICD-10-CM | POA: Diagnosis not present

## 2022-07-30 DIAGNOSIS — Z941 Heart transplant status: Secondary | ICD-10-CM | POA: Diagnosis not present

## 2022-07-30 DIAGNOSIS — Z48298 Encounter for aftercare following other organ transplant: Secondary | ICD-10-CM | POA: Diagnosis not present

## 2022-07-30 DIAGNOSIS — Z79899 Other long term (current) drug therapy: Secondary | ICD-10-CM | POA: Diagnosis not present

## 2022-08-22 ENCOUNTER — Other Ambulatory Visit (HOSPITAL_COMMUNITY): Payer: Self-pay | Admitting: Psychiatry

## 2022-08-26 ENCOUNTER — Encounter: Payer: Self-pay | Admitting: *Deleted

## 2022-09-08 ENCOUNTER — Encounter (HOSPITAL_COMMUNITY): Payer: Self-pay

## 2022-09-08 ENCOUNTER — Telehealth (HOSPITAL_COMMUNITY): Payer: Medicaid Other | Admitting: Psychiatry

## 2022-09-17 ENCOUNTER — Telehealth (HOSPITAL_COMMUNITY): Payer: Medicaid Other | Admitting: Psychiatry

## 2022-09-17 ENCOUNTER — Encounter (HOSPITAL_COMMUNITY): Payer: Self-pay

## 2022-09-22 ENCOUNTER — Encounter (HOSPITAL_COMMUNITY): Payer: Self-pay | Admitting: Psychiatry

## 2022-09-22 ENCOUNTER — Telehealth (INDEPENDENT_AMBULATORY_CARE_PROVIDER_SITE_OTHER): Payer: Medicaid Other | Admitting: Psychiatry

## 2022-09-22 DIAGNOSIS — F321 Major depressive disorder, single episode, moderate: Secondary | ICD-10-CM

## 2022-09-22 MED ORDER — ALPRAZOLAM 1 MG PO TABS: ORAL_TABLET | ORAL | 2 refills | Status: DC

## 2022-09-22 MED ORDER — BUPROPION HCL ER (XL) 300 MG PO TB24: ORAL_TABLET | ORAL | 2 refills | Status: DC

## 2022-09-22 MED ORDER — VILAZODONE HCL 10 MG PO TABS: 10.0000 mg | ORAL_TABLET | Freq: Every day | ORAL | 2 refills | Status: DC
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
## 2022-10-28 DIAGNOSIS — E785 Hyperlipidemia, unspecified: Secondary | ICD-10-CM | POA: Diagnosis not present

## 2022-10-28 DIAGNOSIS — Z79899 Other long term (current) drug therapy: Secondary | ICD-10-CM | POA: Diagnosis not present

## 2022-10-28 DIAGNOSIS — E559 Vitamin D deficiency, unspecified: Secondary | ICD-10-CM | POA: Diagnosis not present

## 2022-10-28 DIAGNOSIS — Z941 Heart transplant status: Secondary | ICD-10-CM | POA: Diagnosis not present

## 2022-10-28 DIAGNOSIS — R7989 Other specified abnormal findings of blood chemistry: Secondary | ICD-10-CM | POA: Diagnosis not present

## 2022-10-28 DIAGNOSIS — Z48298 Encounter for aftercare following other organ transplant: Secondary | ICD-10-CM | POA: Diagnosis not present

## 2022-10-29 ENCOUNTER — Encounter: Payer: Self-pay | Admitting: Family Medicine

## 2022-10-29 ENCOUNTER — Ambulatory Visit: Payer: Medicaid Other | Admitting: Family Medicine

## 2022-10-29 VITALS — BP 132/66 | HR 88 | Temp 97.5°F | Ht 68.0 in | Wt 139.0 lb

## 2022-10-29 DIAGNOSIS — E781 Pure hyperglyceridemia: Secondary | ICD-10-CM

## 2022-10-29 DIAGNOSIS — E1159 Type 2 diabetes mellitus with other circulatory complications: Secondary | ICD-10-CM | POA: Diagnosis not present

## 2022-10-29 DIAGNOSIS — I152 Hypertension secondary to endocrine disorders: Secondary | ICD-10-CM

## 2022-10-29 DIAGNOSIS — N1831 Chronic kidney disease, stage 3a: Secondary | ICD-10-CM

## 2022-10-29 DIAGNOSIS — E1122 Type 2 diabetes mellitus with diabetic chronic kidney disease: Secondary | ICD-10-CM | POA: Diagnosis not present

## 2022-10-29 DIAGNOSIS — N183 Chronic kidney disease, stage 3 unspecified: Secondary | ICD-10-CM

## 2022-10-29 NOTE — Progress Notes (Signed)
BP 132/66   Pulse 88   Temp (!) 97.5 F (36.4 C)   Ht _0  (1.727 m)   Wt 139 lb (63 kg)   SpO2 100%   BMI 21.13 kg/m    Subjective:   Patient ID: Anthony Price, male    DOB: August 19, 1956, 67 y.o.   MRN: [id number]  HPI: Anthony Price is a 67 y.o. male presenting on 10/29/2022 for Medical Management of Chronic Issues, Chronic Kidney Disease, and Diabetes   HPI Type 2 diabetes mellitus Patient comes in today for recheck of his diabetes. Patient has been currently taking metformin and Januvia. Patient is currently on an ACE inhibitor/ARB. Patient has seen an ophthalmologist this year. Patient denies any issues with their feet. The symptom started onset as an adult CKD and hypertension and history of heart transplant ARE RELATED TO DM   Hypertension Patient is currently on amlodipine and carvedilol and ramipril, and their blood pressure today is 132/66. Patient denies any lightheadedness or dizziness. Patient denies headaches, blurred vision, chest pains, shortness of breath, or weakness. Denies any side effects from medication and is content with current medication.   Hypertriglyceridemia Patient is coming in for recheck of his hyperlipidemia. The patient is currently taking atorvastatin and fish oil. They deny any issues with myalgias or history of liver damage from it. They deny any focal numbness or weakness or chest pain.   Relevant past medical, surgical, family and social history reviewed and updated as indicated. Interim medical history since our last visit reviewed. Allergies and medications reviewed and updated.  Review of Systems  Constitutional:  Negative for chills and fever.  Eyes:  Negative for visual disturbance.  Respiratory:  Negative for shortness of breath and wheezing.   Cardiovascular:  Negative for chest pain and leg swelling.  Musculoskeletal:  Negative for back pain and gait problem.  Skin:  Negative for rash.  Neurological:  Negative for dizziness,  weakness and light-headedness.  All other systems reviewed and are negative.   Per HPI unless specifically indicated above   Allergies as of 10/29/2022       Reactions   Lactose Intolerance (gi)    bloating   Tramadol Nausea Only        Medication List        Accurate as of October 29, 2022 11:24 AM. If you have any questions, ask your nurse or doctor.          Accu-Chek Aviva Plus w/Device Kit Test qd. DX E11.9   Accu-Chek Softclix Lancets lancets Test once qd. DX E11.9   acetaminophen 500 MG tablet Commonly known as: TYLENOL Take 1,000 mg by mouth every 6 (six) hours as needed for moderate pain. Pain   ALPRAZolam 1 MG tablet Commonly known as: XANAX TAKE (1) TABLET BY MOUTH THREE TIMES DAILY.   amLODipine 2.5 MG tablet Commonly known as: NORVASC Take 1 tablet (2.5 mg total) by mouth daily.   aspirin EC 81 MG tablet Take 81 mg by mouth daily.   atorvastatin 40 MG tablet Commonly known as: LIPITOR Take 1 tablet (40 mg total) by mouth daily.   buPROPion 300 MG 24 hr tablet Commonly known as: WELLBUTRIN XL TAKE 1 TABLET BY MOUTH ONCE EVERY MORNING.   carvedilol 12.5 MG tablet Commonly known as: COREG TAKE (1) TABLET BY MOUTH TWICE DAILY.   esomeprazole 20 MG capsule Commonly known as: NEXIUM TAKE (1) CAPSULEBY MOUTH TWICE DAILY BEFORE A MEAL   glucose blood test strip  Commonly known as: Accu-Chek Aviva Plus Test once qd. DX E11.9   metFORMIN 500 MG tablet Commonly known as: GLUCOPHAGE Take by mouth daily.   mycophenolate 500 MG tablet Commonly known as: CELLCEPT Take 500-1,000 mg by mouth See admin instructions. Take 500 mg by mouth in the morning and 1000 mg at night   Omega-3 1000 MG Caps Take 1,000 mg by mouth in the morning and at bedtime.   PEN NEEDLES 31GX5/16" 31G X 8 MM Misc 1 each by Does not apply route daily.   PROBIOTIC DAILY PO Take 1 capsule by mouth daily.   ramipril 2.5 MG capsule Commonly known as: ALTACE Take 2.5 mg  by mouth daily.   sitaGLIPtin 50 MG tablet Commonly known as: JANUVIA Take 1 tablet by mouth daily.   tacrolimus 0.5 MG capsule Commonly known as: PROGRAF Take 0.5 mg by mouth 2 (two) times daily.   Vilazodone HCl 10 MG Tabs Commonly known as: VIIBRYD Take 1 tablet (10 mg total) by mouth daily.         Objective:   BP 132/66   Pulse 88   Temp (!) 97.5 F (36.4 C)   Ht _0  (1.727 m)   Wt 139 lb (63 kg)   SpO2 100%   BMI 21.13 kg/m   Wt Readings from Last 3 Encounters:  10/29/22 139 lb (63 kg)  07/18/22 133 lb (60.3 kg)  04/16/22 137 lb (62.1 kg)    Physical Exam Vitals and nursing note reviewed.  Constitutional:      General: He is not in acute distress.    Appearance: He is well-developed. He is not diaphoretic.  Eyes:     General: No scleral icterus.    Conjunctiva/sclera: Conjunctivae normal.  Neck:     Thyroid: No thyromegaly.  Cardiovascular:     Rate and Rhythm: Normal rate and regular rhythm.     Heart sounds: Normal heart sounds. No murmur heard. Pulmonary:     Effort: Pulmonary effort is normal. No respiratory distress.     Breath sounds: Normal breath sounds. No wheezing.  Musculoskeletal:        General: No swelling. Normal range of motion.     Cervical back: Neck supple.  Lymphadenopathy:     Cervical: No cervical adenopathy.  Skin:    General: Skin is warm and dry.     Findings: No rash.  Neurological:     Mental Status: He is alert and oriented to person, place, and time.     Coordination: Coordination normal.  Psychiatric:        Behavior: Behavior normal.       Assessment & Plan:   Problem List Items Addressed This Visit       Cardiovascular and Mediastinum   Hypertension associated with diabetes ([place])     Endocrine   Diabetes type 2, controlled ([place])   Relevant Orders   Lipid panel   Bayer DCA Hb A1c Waived   PSA, total and free   Microalbumin / creatinine urine ratio     Genitourinary   CKD (chronic kidney  disease), stage III (HCC) - Primary   Relevant Orders   Lipid panel   Bayer DCA Hb A1c Waived   PSA, total and free   Microalbumin / creatinine urine ratio     Other   Hypertriglyceridemia  Patient says he just got blood work done yesterday that included an A1c in [id number].  Cannot see the results from it yet but will  watch for this.  He is not going to do blood work today because of this.  Follow up plan: Return in about 3 months (around 01/28/2023), or if symptoms worsen or fail to improve, for Diabetes recheck.  Counseling provided for all of the vaccine components Orders Placed This Encounter  Procedures   Lipid panel   Bayer DCA Hb A1c Waived   PSA, total and free   Microalbumin / creatinine urine ratio    Caryl Pina, MD [facility] 10/29/2022, 11:24 AM

## 2022-10-30 LAB — MICROALBUMIN / CREATININE URINE RATIO
Creatinine, Urine: 101.2 mg/dL
Microalb/Creat Ratio: 29 mg/g creat (ref 0–29)
Microalbumin, Urine: 29.8 ug/mL

## 2022-11-07 ENCOUNTER — Other Ambulatory Visit: Payer: Self-pay | Admitting: Family Medicine

## 2022-11-08 NOTE — Telephone Encounter (Signed)
Last office visit 11/08/22 Listed as historical med, per protocol must be approved by provider

## 2022-11-25 DIAGNOSIS — I700 Atherosclerosis of aorta: Secondary | ICD-10-CM | POA: Diagnosis not present

## 2022-11-25 DIAGNOSIS — Z79899 Other long term (current) drug therapy: Secondary | ICD-10-CM | POA: Diagnosis not present

## 2022-11-25 DIAGNOSIS — Z941 Heart transplant status: Secondary | ICD-10-CM | POA: Diagnosis not present

## 2022-11-26 DIAGNOSIS — Z941 Heart transplant status: Secondary | ICD-10-CM | POA: Diagnosis not present

## 2022-12-24 ENCOUNTER — Encounter (HOSPITAL_COMMUNITY): Payer: Self-pay | Admitting: Psychiatry

## 2022-12-24 ENCOUNTER — Telehealth (INDEPENDENT_AMBULATORY_CARE_PROVIDER_SITE_OTHER): Payer: Medicaid Other | Admitting: Psychiatry

## 2022-12-24 DIAGNOSIS — F321 Major depressive disorder, single episode, moderate: Secondary | ICD-10-CM

## 2022-12-24 MED ORDER — ALPRAZOLAM 1 MG PO TABS: ORAL_TABLET | ORAL | 2 refills | Status: DC

## 2022-12-24 MED ORDER — BUPROPION HCL ER (XL) 300 MG PO TB24: ORAL_TABLET | ORAL | 2 refills | Status: DC

## 2022-12-24 MED ORDER — VILAZODONE HCL 10 MG PO TABS: 10.0000 mg | ORAL_TABLET | Freq: Every day | ORAL | 2 refills | Status: DC
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
## 2023-01-29 ENCOUNTER — Encounter: Payer: Self-pay | Admitting: Family Medicine

## 2023-01-29 ENCOUNTER — Ambulatory Visit (INDEPENDENT_AMBULATORY_CARE_PROVIDER_SITE_OTHER): Payer: Medicare HMO | Admitting: Family Medicine

## 2023-01-29 ENCOUNTER — Telehealth: Payer: Self-pay

## 2023-01-29 VITALS — BP 117/68 | HR 87 | Ht 68.0 in | Wt 140.0 lb

## 2023-01-29 DIAGNOSIS — E781 Pure hyperglyceridemia: Secondary | ICD-10-CM | POA: Diagnosis not present

## 2023-01-29 DIAGNOSIS — I152 Hypertension secondary to endocrine disorders: Secondary | ICD-10-CM | POA: Diagnosis not present

## 2023-01-29 DIAGNOSIS — E1122 Type 2 diabetes mellitus with diabetic chronic kidney disease: Secondary | ICD-10-CM

## 2023-01-29 DIAGNOSIS — N183 Chronic kidney disease, stage 3 unspecified: Secondary | ICD-10-CM | POA: Diagnosis not present

## 2023-01-29 DIAGNOSIS — E1159 Type 2 diabetes mellitus with other circulatory complications: Secondary | ICD-10-CM

## 2023-01-29 DIAGNOSIS — N1831 Chronic kidney disease, stage 3a: Secondary | ICD-10-CM

## 2023-01-29 DIAGNOSIS — Z125 Encounter for screening for malignant neoplasm of prostate: Secondary | ICD-10-CM | POA: Diagnosis not present

## 2023-01-29 LAB — LIPID PANEL

## 2023-01-29 LAB — BAYER DCA HB A1C WAIVED: HB A1C (BAYER DCA - WAIVED): 8.6 % — ABNORMAL HIGH (ref 4.8–5.6)

## 2023-01-29 MED ORDER — DAPAGLIFLOZIN PROPANEDIOL 10 MG PO TABS: 10.0000 mg | ORAL_TABLET | Freq: Every day | ORAL | 3 refills | Status: DC

## 2023-01-29 NOTE — Progress Notes (Signed)
BP 117/68   Pulse 87   Ht 5\' 8"  (1.727 m)   Wt 140 lb (63.5 kg)   SpO2 99%   BMI 21.29 kg/m    Subjective:   Patient ID: Anthony Price, male    DOB: [birth year], 67 y.o.   MRN: [id number]  HPI: Anthony Price is a 67 y.o. male presenting on 01/29/2023 for Medical Management of Chronic Issues, Chronic Kidney Disease, and Diabetes   HPI Type 2 diabetes mellitus Patient comes in today for recheck of his diabetes. Patient has been currently taking Januvia and metformin. Patient is currently on an ACE inhibitor/ARB. Patient has not seen an ophthalmologist this year. Patient denies any new issues with their feet. The symptom started onset as an adult hypertension and hyperlipidemia and hypertriglyceridemia and CKD 3 ARE RELATED TO DM   Hypertension Patient is currently on amlodipine and carvedilol and ramipril, and their blood pressure today is 117/68. Patient denies any lightheadedness or dizziness. Patient denies headaches, blurred vision, chest pains, shortness of breath, or weakness. Denies any side effects from medication and is content with current medication.   Hyperlipidemia and hypertriglyceridemia Patient is coming in for recheck of his hyperlipidemia. The patient is currently taking atorvastatin and fish oil. They deny any issues with myalgias or history of liver damage from it. They deny any focal numbness or weakness or chest pain.   Relevant past medical, surgical, family and social history reviewed and updated as indicated. Interim medical history since our last visit reviewed. Allergies and medications reviewed and updated.  Review of Systems  Constitutional:  Negative for chills and fever.  Eyes:  Negative for visual disturbance.  Respiratory:  Negative for shortness of breath and wheezing.   Cardiovascular:  Negative for chest pain and leg swelling.  Musculoskeletal:  Negative for back pain and gait problem.  Skin:  Negative for rash.  All other systems reviewed  and are negative.   Per HPI unless specifically indicated above   Allergies as of 01/29/2023       Reactions   Lactose Intolerance (gi)    bloating   Tramadol Nausea Only        Medication List        Accurate as of January 29, 2023 11:44 AM. If you have any questions, ask your nurse or doctor.          Accu-Chek Aviva Plus w/Device Kit Test qd. DX E11.9   Accu-Chek Softclix Lancets lancets Test once qd. DX E11.9   acetaminophen 500 MG tablet Commonly known as: TYLENOL Take 1,000 mg by mouth every 6 (six) hours as needed for moderate pain. Pain   ALPRAZolam 1 MG tablet Commonly known as: XANAX TAKE (1) TABLET BY MOUTH THREE TIMES DAILY.   amLODipine 2.5 MG tablet Commonly known as: NORVASC Take 1 tablet (2.5 mg total) by mouth daily.   aspirin EC 81 MG tablet Take 81 mg by mouth daily.   atorvastatin 40 MG tablet Commonly known as: LIPITOR Take 1 tablet (40 mg total) by mouth daily.   buPROPion 300 MG 24 hr tablet Commonly known as: WELLBUTRIN XL TAKE 1 TABLET BY MOUTH ONCE EVERY MORNING.   carvedilol 12.5 MG tablet Commonly known as: COREG TAKE (1) TABLET BY MOUTH TWICE DAILY.   dapagliflozin propanediol 10 MG Tabs tablet Commonly known as: Farxiga Take 1 tablet (10 mg total) by mouth daily before breakfast. Started by: Fransisca Kaufmann Addison Freimuth, MD   esomeprazole 20 MG capsule Commonly known as:  NEXIUM TAKE (1) CAPSULEBY MOUTH TWICE DAILY BEFORE A MEAL   glucose blood test strip Commonly known as: Accu-Chek Aviva Plus Test once qd. DX E11.9   metFORMIN 500 MG tablet Commonly known as: GLUCOPHAGE TAKE 1 TABLET BY MOUTH IN THE MORNING AND 1 TABLET IN THE EVENING.   mycophenolate 500 MG tablet Commonly known as: CELLCEPT Take 500-1,000 mg by mouth See admin instructions. Take 500 mg by mouth in the morning and 1000 mg at night   Omega-3 1000 MG Caps Take 1,000 mg by mouth in the morning and at bedtime.   PEN NEEDLES 31GX5/16" 31G X 8 MM Misc 1  each by Does not apply route daily.   PROBIOTIC DAILY PO Take 1 capsule by mouth daily.   ramipril 2.5 MG capsule Commonly known as: ALTACE Take 2.5 mg by mouth daily.   sitaGLIPtin 50 MG tablet Commonly known as: JANUVIA Take 1 tablet by mouth daily.   tacrolimus 0.5 MG capsule Commonly known as: PROGRAF Take 0.5 mg by mouth 2 (two) times daily.   Vilazodone HCl 10 MG Tabs Commonly known as: VIIBRYD Take 1 tablet (10 mg total) by mouth daily.         Objective:   BP 117/68   Pulse 87   Ht 5\' 8"  (1.727 m)   Wt 140 lb (63.5 kg)   SpO2 99%   BMI 21.29 kg/m   Wt Readings from Last 3 Encounters:  01/29/23 140 lb (63.5 kg)  10/29/22 139 lb (63 kg)  07/18/22 133 lb (60.3 kg)    Physical Exam Vitals and nursing note reviewed.  Constitutional:      General: He is not in acute distress.    Appearance: He is well-developed. He is not diaphoretic.  Eyes:     General: No scleral icterus.    Conjunctiva/sclera: Conjunctivae normal.  Neck:     Thyroid: No thyromegaly.  Cardiovascular:     Rate and Rhythm: Normal rate and regular rhythm.     Heart sounds: Normal heart sounds. No murmur heard. Pulmonary:     Effort: Pulmonary effort is normal. No respiratory distress.     Breath sounds: Normal breath sounds. No wheezing.  Musculoskeletal:        General: Normal range of motion.     Cervical back: Neck supple.  Lymphadenopathy:     Cervical: No cervical adenopathy.  Skin:    General: Skin is warm and dry.     Findings: No rash.  Neurological:     Mental Status: He is alert and oriented to person, place, and time.     Coordination: Coordination normal.  Psychiatric:        Behavior: Behavior normal.     Results for orders placed or performed in visit on 10/29/22  Microalbumin / creatinine urine ratio  Result Value Ref Range   Creatinine, Urine 101.2 Not Estab. mg/dL   Microalbumin, Urine 29.8 Not Estab. ug/mL   Microalb/Creat Ratio 29 0 - 29 mg/g creat     Assessment & Plan:   Problem List Items Addressed This Visit       Cardiovascular and Mediastinum   Hypertension associated with diabetes   Relevant Medications   dapagliflozin propanediol (FARXIGA) 10 MG TABS tablet     Endocrine   Diabetes type 2, controlled - Primary   Relevant Medications   dapagliflozin propanediol (FARXIGA) 10 MG TABS tablet   Other Relevant Orders   BMP8+EGFR   CBC with Differential/Platelet   Lipid panel  Bayer DCA Hb A1c Waived   PSA, total and free     Genitourinary   CKD (chronic kidney disease), stage III   Relevant Orders   BMP8+EGFR   CBC with Differential/Platelet   Lipid panel   Bayer DCA Hb A1c Waived   PSA, total and free     Other   Hypertriglyceridemia    A1c is up at 8.6, we will start [place].  He says he is already doing really good in his diet and does not know what else he can do there.  Continue with other medicine as is including metformin and Januvia  Blood pressure looks decent, continues to follow-up with his cardiology.  Follow up plan: Return in about 3 months (around 04/30/2023), or if symptoms worsen or fail to improve, for Diabetes recheck.  Counseling provided for all of the vaccine components Orders Placed This Encounter  Procedures   BMP8+EGFR   CBC with Differential/Platelet   Lipid panel   Bayer DCA Hb A1c Waived   PSA, total and free    Caryl Pina, MD [facility] 01/29/2023, 11:44 AM

## 2023-01-29 NOTE — Telephone Encounter (Signed)
Anthony Price ([id number]) PA Case ID #: [id number] Rx #: [id number] Need Help? Call us at [phone] Status sent iconSent to Plan today Drug Farxiga 10MG  tablets ePA cloud logo Form [employer] [facility] [place] Electronic [place] Form [phone] NCPDP) Original Claim Info 67

## 2023-01-29 NOTE — Telephone Encounter (Signed)
Anthony Price ([id number]) PA Case ID #: [id number] Rx #: [id number] Need Help? Call us at [phone] Outcome Approved today PA Case: [id number], Status: Approved, Coverage Starts on: 01/29/2023 12:00:00 AM, Coverage Ends on: 01/29/2024 12:00:00 AM. Authorization Expiration Date: 01/28/2024 Drug Farxiga 10MG  tablets ePA cloud logo Form [facility] [facility] [place] Electronic [place] Form [phone] NCPDP) Original Claim Info 34  Pharmacy informed

## 2023-01-30 LAB — CBC WITH DIFFERENTIAL/PLATELET
Basophils Absolute: 0.1 10*3/uL (ref 0.0–0.2)
Basos: 1 %
EOS (ABSOLUTE): 0.1 10*3/uL (ref 0.0–0.4)
Eos: 2 %
Hematocrit: 41.3 % (ref 37.5–51.0)
Hemoglobin: 12.7 g/dL — ABNORMAL LOW (ref 13.0–17.7)
Immature Grans (Abs): 0 10*3/uL (ref 0.0–0.1)
Immature Granulocytes: 0 %
Lymphocytes Absolute: 2.2 10*3/uL (ref 0.7–3.1)
Lymphs: 32 %
MCH: 24.1 pg — ABNORMAL LOW (ref 26.6–33.0)
MCHC: 30.8 g/dL — ABNORMAL LOW (ref 31.5–35.7)
MCV: 79 fL (ref 79–97)
Monocytes Absolute: 0.5 10*3/uL (ref 0.1–0.9)
Monocytes: 7 %
Neutrophils Absolute: 4 10*3/uL (ref 1.4–7.0)
Neutrophils: 58 %
Platelets: 151 10*3/uL (ref 150–450)
RBC: 5.26 x10E6/uL (ref 4.14–5.80)
RDW: 15.3 % (ref 11.6–15.4)
WBC: 6.8 10*3/uL (ref 3.4–10.8)

## 2023-01-30 LAB — BMP8+EGFR
BUN/Creatinine Ratio: 8 — ABNORMAL LOW (ref 10–24)
BUN: 15 mg/dL (ref 8–27)
CO2: 21 mmol/L (ref 20–29)
Calcium: 9.1 mg/dL (ref 8.6–10.2)
Chloride: 97 mmol/L (ref 96–106)
Creatinine, Ser: 1.78 mg/dL — ABNORMAL HIGH (ref 0.76–1.27)
Glucose: 300 mg/dL — ABNORMAL HIGH (ref 70–99)
Potassium: 5 mmol/L (ref 3.5–5.2)
Sodium: 134 mmol/L (ref 134–144)
eGFR: 42 mL/min/{1.73_m2} — ABNORMAL LOW (ref >59)

## 2023-01-30 LAB — PSA, TOTAL AND FREE
PSA, Free Pct: 35 %
PSA, Free: 0.49 ng/mL
Prostate Specific Ag, Serum: 1.4 ng/mL (ref 0.0–4.0)

## 2023-01-30 LAB — LIPID PANEL
Chol/HDL Ratio: 3.8 ratio (ref 0.0–5.0)
Cholesterol, Total: 100 mg/dL (ref 100–199)
HDL: 26 mg/dL — ABNORMAL LOW (ref >39)
LDL Chol Calc (NIH): 39 mg/dL (ref 0–99)
Triglycerides: 217 mg/dL — ABNORMAL HIGH (ref 0–149)
VLDL Cholesterol Cal: 35 mg/dL (ref 5–40)

## 2023-02-02 ENCOUNTER — Other Ambulatory Visit (HOSPITAL_COMMUNITY): Payer: Self-pay

## 2023-02-10 ENCOUNTER — Telehealth: Payer: Self-pay | Admitting: Family Medicine

## 2023-02-10 NOTE — Telephone Encounter (Signed)
Per EPIC pt should be still taking metformin, Januvia, and [place].  Keep follow up with PCP, strict low carb diet.

## 2023-02-10 NOTE — Telephone Encounter (Signed)
Lmtcb.

## 2023-02-16 NOTE — Telephone Encounter (Signed)
Pt has been made aware. He thought he was supposed to stop all diabetic meds and only take [place].  Pt will start back taking Metformin and Januvia and call back if sugars to not come down. He is aware to keep follow up.

## 2023-02-21 ENCOUNTER — Other Ambulatory Visit: Payer: Self-pay | Admitting: Family Medicine

## 2023-02-21 DIAGNOSIS — E1159 Type 2 diabetes mellitus with other circulatory complications: Secondary | ICD-10-CM

## 2023-02-21 DIAGNOSIS — E781 Pure hyperglyceridemia: Secondary | ICD-10-CM

## 2023-02-21 DIAGNOSIS — I10 Essential (primary) hypertension: Secondary | ICD-10-CM

## 2023-02-21 DIAGNOSIS — K219 Gastro-esophageal reflux disease without esophagitis: Secondary | ICD-10-CM

## 2023-03-13 DIAGNOSIS — Z941 Heart transplant status: Secondary | ICD-10-CM | POA: Diagnosis not present

## 2023-03-13 DIAGNOSIS — Z48298 Encounter for aftercare following other organ transplant: Secondary | ICD-10-CM | POA: Diagnosis not present

## 2023-03-13 DIAGNOSIS — R7989 Other specified abnormal findings of blood chemistry: Secondary | ICD-10-CM | POA: Diagnosis not present

## 2023-03-24 ENCOUNTER — Telehealth (INDEPENDENT_AMBULATORY_CARE_PROVIDER_SITE_OTHER): Payer: Medicare HMO | Admitting: Psychiatry

## 2023-03-24 ENCOUNTER — Encounter (HOSPITAL_COMMUNITY): Payer: Self-pay | Admitting: Psychiatry

## 2023-03-24 DIAGNOSIS — T86290 Cardiac allograft vasculopathy: Secondary | ICD-10-CM | POA: Diagnosis not present

## 2023-03-24 DIAGNOSIS — F418 Other specified anxiety disorders: Secondary | ICD-10-CM | POA: Diagnosis not present

## 2023-03-24 DIAGNOSIS — E119 Type 2 diabetes mellitus without complications: Secondary | ICD-10-CM | POA: Diagnosis not present

## 2023-03-24 DIAGNOSIS — E1122 Type 2 diabetes mellitus with diabetic chronic kidney disease: Secondary | ICD-10-CM | POA: Diagnosis not present

## 2023-03-24 DIAGNOSIS — I4510 Unspecified right bundle-branch block: Secondary | ICD-10-CM | POA: Diagnosis not present

## 2023-03-24 DIAGNOSIS — Z79899 Other long term (current) drug therapy: Secondary | ICD-10-CM | POA: Diagnosis not present

## 2023-03-24 DIAGNOSIS — Z4821 Encounter for aftercare following heart transplant: Secondary | ICD-10-CM | POA: Diagnosis not present

## 2023-03-24 DIAGNOSIS — I351 Nonrheumatic aortic (valve) insufficiency: Secondary | ICD-10-CM | POA: Diagnosis not present

## 2023-03-24 DIAGNOSIS — F321 Major depressive disorder, single episode, moderate: Secondary | ICD-10-CM

## 2023-03-24 DIAGNOSIS — Z125 Encounter for screening for malignant neoplasm of prostate: Secondary | ICD-10-CM | POA: Diagnosis not present

## 2023-03-24 DIAGNOSIS — I082 Rheumatic disorders of both aortic and tricuspid valves: Secondary | ICD-10-CM | POA: Diagnosis not present

## 2023-03-24 DIAGNOSIS — E559 Vitamin D deficiency, unspecified: Secondary | ICD-10-CM | POA: Diagnosis not present

## 2023-03-24 DIAGNOSIS — I517 Cardiomegaly: Secondary | ICD-10-CM | POA: Diagnosis not present

## 2023-03-24 DIAGNOSIS — Z7969 Long term (current) use of other immunomodulators and immunosuppressants: Secondary | ICD-10-CM | POA: Diagnosis not present

## 2023-03-24 DIAGNOSIS — N183 Chronic kidney disease, stage 3 unspecified: Secondary | ICD-10-CM | POA: Diagnosis not present

## 2023-03-24 DIAGNOSIS — Z941 Heart transplant status: Secondary | ICD-10-CM | POA: Diagnosis not present

## 2023-03-24 DIAGNOSIS — E785 Hyperlipidemia, unspecified: Secondary | ICD-10-CM | POA: Diagnosis not present

## 2023-03-24 MED ORDER — BUPROPION HCL ER (XL) 300 MG PO TB24
ORAL_TABLET | ORAL | 2 refills | Status: DC
Start: 2023-03-24 — End: 2023-06-24

## 2023-03-24 MED ORDER — ALPRAZOLAM 1 MG PO TABS: ORAL_TABLET | ORAL | 2 refills | Status: DC

## 2023-03-24 MED ORDER — VILAZODONE HCL 10 MG PO TABS: 10.0000 mg | ORAL_TABLET | Freq: Every day | ORAL | 2 refills | Status: DC
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
## 2023-05-01 ENCOUNTER — Ambulatory Visit (INDEPENDENT_AMBULATORY_CARE_PROVIDER_SITE_OTHER): Payer: Medicare HMO | Admitting: Family Medicine

## 2023-05-01 ENCOUNTER — Encounter: Payer: Self-pay | Admitting: Family Medicine

## 2023-05-01 VITALS — BP 125/73 | HR 90 | Ht 68.0 in | Wt 136.0 lb

## 2023-05-01 DIAGNOSIS — N1831 Chronic kidney disease, stage 3a: Secondary | ICD-10-CM

## 2023-05-01 DIAGNOSIS — Z23 Encounter for immunization: Secondary | ICD-10-CM | POA: Diagnosis not present

## 2023-05-01 DIAGNOSIS — E1169 Type 2 diabetes mellitus with other specified complication: Secondary | ICD-10-CM

## 2023-05-01 DIAGNOSIS — E1122 Type 2 diabetes mellitus with diabetic chronic kidney disease: Secondary | ICD-10-CM | POA: Diagnosis not present

## 2023-05-01 DIAGNOSIS — Z7985 Long-term (current) use of injectable non-insulin antidiabetic drugs: Secondary | ICD-10-CM

## 2023-05-01 DIAGNOSIS — E781 Pure hyperglyceridemia: Secondary | ICD-10-CM | POA: Diagnosis not present

## 2023-05-01 DIAGNOSIS — E1159 Type 2 diabetes mellitus with other circulatory complications: Secondary | ICD-10-CM

## 2023-05-01 DIAGNOSIS — Z7984 Long term (current) use of oral hypoglycemic drugs: Secondary | ICD-10-CM | POA: Diagnosis not present

## 2023-05-01 DIAGNOSIS — N183 Chronic kidney disease, stage 3 unspecified: Secondary | ICD-10-CM

## 2023-05-01 DIAGNOSIS — N529 Male erectile dysfunction, unspecified: Secondary | ICD-10-CM

## 2023-05-01 DIAGNOSIS — I152 Hypertension secondary to endocrine disorders: Secondary | ICD-10-CM | POA: Diagnosis not present

## 2023-05-01 MED ORDER — AMLODIPINE BESYLATE 2.5 MG PO TABS: 2.5000 mg | ORAL_TABLET | Freq: Every day | ORAL | 3 refills | Status: DC

## 2023-05-01 MED ORDER — SEMAGLUTIDE(0.25 OR 0.5MG/DOS) 2 MG/3ML ~~LOC~~ SOPN
0.2500 mg | PEN_INJECTOR | SUBCUTANEOUS | 3 refills | Status: DC
Start: 2023-05-01 — End: 2023-08-05

## 2023-05-01 MED ORDER — SILDENAFIL CITRATE 20 MG PO TABS
20.0000 mg | ORAL_TABLET | ORAL | 1 refills | Status: DC | PRN
Start: 2023-05-01 — End: 2023-09-28

## 2023-05-01 MED ORDER — METFORMIN HCL 500 MG PO TABS: 500.0000 mg | ORAL_TABLET | Freq: Two times a day (BID) | ORAL | 3 refills | Status: DC

## 2023-05-01 NOTE — Progress Notes (Addendum)
BP 125/73   Pulse 90   Ht 5\' 8"  (1.727 m)   Wt 136 lb (61.7 kg)   SpO2 98%   BMI 20.68 kg/m    Subjective:   Patient ID: Anthony Price, male    DOB: [birth year], 67 y.o.   MRN: [id number]  HPI: Anthony Price is a 67 y.o. male presenting on 05/01/2023 for Diabetes, Medical Management of Chronic Issues, and Chronic Kidney Disease   HPI Type 2 diabetes mellitus Patient comes in today for recheck of his diabetes. Patient has been currently taking [place] and Januvia and metformin. Patient is currently on an ACE inhibitor/ARB. Patient has not seen an ophthalmologist this year. Patient denies any new issues with their feet. The symptom started onset as an adult hypertension and hyperlipidemia and CKD ARE RELATED TO DM   Hypertension Patient is currently on carvedilol and ramipril and amlodipine, and their blood pressure today is 125/73. Patient denies any lightheadedness or dizziness. Patient denies headaches, blurred vision, chest pains, shortness of breath, or weakness. Denies any side effects from medication and is content with current medication.   Hyperlipidemia Patient is coming in for recheck of his hyperlipidemia. The patient is currently taking atorvastatin and fish oils. They deny any issues with myalgias or history of liver damage from it. They deny any focal numbness or weakness or chest pain.   Patient has erectile dysfunction and has used sildenafil successfully before, he had been separated from his wife.  Needed it for some time but now he has a new relationship and wants to get back.  Relevant past medical, surgical, family and social history reviewed and updated as indicated. Interim medical history since our last visit reviewed. Allergies and medications reviewed and updated.  Review of Systems  Constitutional:  Negative for chills and fever.  Eyes:  Negative for visual disturbance.  Respiratory:  Negative for shortness of breath and wheezing.   Cardiovascular:   Negative for chest pain and leg swelling.  Musculoskeletal:  Negative for back pain and gait problem.  Skin:  Negative for rash.  Neurological:  Negative for dizziness, weakness and light-headedness.  All other systems reviewed and are negative.   Per HPI unless specifically indicated above   Allergies as of 05/01/2023       Reactions   Lactose Intolerance (gi)    bloating   Tramadol Nausea Only        Medication List        Accurate as of May 01, 2023 11:18 AM. If you have any questions, ask your nurse or doctor.          STOP taking these medications    sitaGLIPtin 50 MG tablet Commonly known as: JANUVIA Stopped by: Nils Pyle, MD       TAKE these medications    Accu-Chek Aviva Plus w/Device Kit Test qd. DX E11.9   Accu-Chek Softclix Lancets lancets Test once qd. DX E11.9   acetaminophen 500 MG tablet Commonly known as: TYLENOL Take 1,000 mg by mouth every 6 (six) hours as needed for moderate pain. Pain   ALPRAZolam 1 MG tablet Commonly known as: XANAX TAKE (1) TABLET BY MOUTH THREE TIMES DAILY.   amLODipine 2.5 MG tablet Commonly known as: NORVASC Take 1 tablet (2.5 mg total) by mouth daily.   aspirin EC 81 MG tablet Take 81 mg by mouth daily.   atorvastatin 40 MG tablet Commonly known as: LIPITOR TAKE ONE TABLET BY MOUTH ONCE DAILY.   buPROPion  300 MG 24 hr tablet Commonly known as: WELLBUTRIN XL TAKE 1 TABLET BY MOUTH ONCE EVERY MORNING.   carvedilol 12.5 MG tablet Commonly known as: COREG TAKE (1) TABLET BY MOUTH TWICE DAILY.   dapagliflozin propanediol 10 MG Tabs tablet Commonly known as: Farxiga Take 1 tablet (10 mg total) by mouth daily before breakfast.   esomeprazole 20 MG capsule Commonly known as: NEXIUM TAKE (1) CAPSULE BY MOUTH TWICE DAILYBEFORE A MEAL   glucose blood test strip Commonly known as: Accu-Chek Aviva Plus Test once qd. DX E11.9   magnesium 30 MG tablet Take 30 mg by mouth 2 (two) times daily.    metFORMIN 500 MG tablet Commonly known as: GLUCOPHAGE Take 1 tablet (500 mg total) by mouth 2 (two) times daily with a meal. What changed: See the new instructions. Changed by: Elige Radon George Alcantar, MD   mycophenolate 500 MG tablet Commonly known as: CELLCEPT Take 500-1,000 mg by mouth See admin instructions. Take 500 mg by mouth in the morning and 1000 mg at night   Omega-3 1000 MG Caps Take 1,000 mg by mouth in the morning and at bedtime.   PEN NEEDLES 31GX5/16" 31G X 8 MM Misc 1 each by Does not apply route daily.   PROBIOTIC DAILY PO Take 1 capsule by mouth daily.   ramipril 2.5 MG capsule Commonly known as: ALTACE Take 2.5 mg by mouth daily.   Semaglutide(0.25 or 0.5MG /DOS) 2 MG/3ML Sopn Inject 0.25 mg into the skin once a week. Started by: Nils Pyle, MD   sildenafil 20 MG tablet Commonly known as: REVATIO Take 1-5 tablets (20-100 mg total) by mouth as needed. Started by: Elige Radon Maylani Embree, MD   tacrolimus 0.5 MG capsule Commonly known as: PROGRAF Take 0.5 mg by mouth 2 (two) times daily.   Vilazodone HCl 10 MG Tabs Commonly known as: VIIBRYD Take 1 tablet (10 mg total) by mouth daily.         Objective:   BP 125/73   Pulse 90   Ht 5\' 8"  (1.727 m)   Wt 136 lb (61.7 kg)   SpO2 98%   BMI 20.68 kg/m   Wt Readings from Last 3 Encounters:  05/01/23 136 lb (61.7 kg)  01/29/23 140 lb (63.5 kg)  10/29/22 139 lb (63 kg)    Physical Exam Vitals and nursing note reviewed.  Constitutional:      General: He is not in acute distress.    Appearance: He is well-developed. He is not diaphoretic.  Eyes:     General: No scleral icterus.    Conjunctiva/sclera: Conjunctivae normal.  Neck:     Thyroid: No thyromegaly.  Cardiovascular:     Rate and Rhythm: Normal rate and regular rhythm.     Heart sounds: Normal heart sounds. No murmur heard. Pulmonary:     Effort: Pulmonary effort is normal. No respiratory distress.     Breath sounds: Normal breath  sounds. No wheezing.  Musculoskeletal:        General: No swelling. Normal range of motion.     Cervical back: Neck supple.  Lymphadenopathy:     Cervical: No cervical adenopathy.  Skin:    General: Skin is warm and dry.     Findings: No rash.  Neurological:     Mental Status: He is alert and oriented to person, place, and time.     Coordination: Coordination normal.  Psychiatric:        Behavior: Behavior normal.  Assessment & Plan:   Problem List Items Addressed This Visit       Cardiovascular and Mediastinum   Hypertension associated with diabetes (HCC)   Relevant Medications   metFORMIN (GLUCOPHAGE) 500 MG tablet   amLODipine (NORVASC) 2.5 MG tablet   sildenafil (REVATIO) 20 MG tablet   Semaglutide,0.25 or 0.5MG /DOS, 2 MG/3ML SOPN     Endocrine   Diabetes type 2, controlled (HCC) - Primary   Relevant Medications   metFORMIN (GLUCOPHAGE) 500 MG tablet   Semaglutide,0.25 or 0.5MG /DOS, 2 MG/3ML SOPN     Genitourinary   CKD (chronic kidney disease), stage III (HCC)   Relevant Medications   amLODipine (NORVASC) 2.5 MG tablet     Other   Hypertriglyceridemia   Relevant Medications   amLODipine (NORVASC) 2.5 MG tablet   sildenafil (REVATIO) 20 MG tablet   Other Visit Diagnoses     Erectile dysfunction, unspecified erectile dysfunction type       Relevant Medications   sildenafil (REVATIO) 20 MG tablet       Had A1c a month and a half ago with cardiology.  Recheck at next visit.  Spent time during the visit discussing and managing chronic care illnesses as above. Follow up plan: Return in about 3 months (around 08/01/2023), or if symptoms worsen or fail to improve, for Diabetes recheck.  Counseling provided for all of the vaccine components No orders of the defined types were placed in this encounter.   Arville Care, MD [facility] 05/01/2023, 11:18 AM

## 2023-05-01 NOTE — Addendum Note (Signed)
Addended by: Dorene Sorrow on: 05/01/2023 11:47 AM   Modules accepted: Orders

## 2023-05-09 ENCOUNTER — Other Ambulatory Visit (HOSPITAL_COMMUNITY): Payer: Self-pay

## 2023-05-11 DIAGNOSIS — Z941 Heart transplant status: Secondary | ICD-10-CM | POA: Diagnosis not present

## 2023-05-11 DIAGNOSIS — I129 Hypertensive chronic kidney disease with stage 1 through stage 4 chronic kidney disease, or unspecified chronic kidney disease: Secondary | ICD-10-CM | POA: Diagnosis not present

## 2023-05-11 DIAGNOSIS — N183 Chronic kidney disease, stage 3 unspecified: Secondary | ICD-10-CM | POA: Diagnosis not present

## 2023-05-11 DIAGNOSIS — E1122 Type 2 diabetes mellitus with diabetic chronic kidney disease: Secondary | ICD-10-CM | POA: Diagnosis not present

## 2023-05-11 DIAGNOSIS — E119 Type 2 diabetes mellitus without complications: Secondary | ICD-10-CM | POA: Diagnosis not present

## 2023-05-20 ENCOUNTER — Telehealth: Payer: Self-pay | Admitting: Family Medicine

## 2023-05-20 NOTE — Telephone Encounter (Signed)
Incresaae the metformin to TID. DC the ozempic

## 2023-05-20 NOTE — Telephone Encounter (Signed)
Ok to d/c Ozempic.  Also takes [place] a10 every day and Metformin 500 bid

## 2023-05-20 NOTE — Telephone Encounter (Signed)
Patient aware.

## 2023-06-22 ENCOUNTER — Emergency Department (HOSPITAL_COMMUNITY): Payer: Medicare HMO

## 2023-06-22 ENCOUNTER — Encounter (HOSPITAL_COMMUNITY): Payer: Self-pay

## 2023-06-22 ENCOUNTER — Emergency Department (HOSPITAL_COMMUNITY)
Admission: EM | Admit: 2023-06-22 | Discharge: 2023-06-22 | Disposition: A | Discharge status: Home / Self Care | Payer: Medicare HMO | Attending: Student | Admitting: Student

## 2023-06-22 ENCOUNTER — Other Ambulatory Visit: Payer: Self-pay

## 2023-06-22 DIAGNOSIS — Z794 Long term (current) use of insulin: Secondary | ICD-10-CM | POA: Diagnosis not present

## 2023-06-22 DIAGNOSIS — Z941 Heart transplant status: Secondary | ICD-10-CM | POA: Insufficient documentation

## 2023-06-22 DIAGNOSIS — W25XXXA Contact with sharp glass, initial encounter: Secondary | ICD-10-CM | POA: Diagnosis not present

## 2023-06-22 DIAGNOSIS — I2510 Atherosclerotic heart disease of native coronary artery without angina pectoris: Secondary | ICD-10-CM | POA: Diagnosis not present

## 2023-06-22 DIAGNOSIS — E1122 Type 2 diabetes mellitus with diabetic chronic kidney disease: Secondary | ICD-10-CM | POA: Diagnosis not present

## 2023-06-22 DIAGNOSIS — N183 Chronic kidney disease, stage 3 unspecified: Secondary | ICD-10-CM | POA: Diagnosis not present

## 2023-06-22 DIAGNOSIS — S51811A Laceration without foreign body of right forearm, initial encounter: Secondary | ICD-10-CM | POA: Diagnosis not present

## 2023-06-22 DIAGNOSIS — Z87891 Personal history of nicotine dependence: Secondary | ICD-10-CM | POA: Insufficient documentation

## 2023-06-22 DIAGNOSIS — I509 Heart failure, unspecified: Secondary | ICD-10-CM | POA: Diagnosis not present

## 2023-06-22 DIAGNOSIS — I10 Essential (primary) hypertension: Secondary | ICD-10-CM | POA: Diagnosis not present

## 2023-06-22 DIAGNOSIS — I130 Hypertensive heart and chronic kidney disease with heart failure and stage 1 through stage 4 chronic kidney disease, or unspecified chronic kidney disease: Secondary | ICD-10-CM | POA: Insufficient documentation

## 2023-06-22 DIAGNOSIS — M79603 Pain in arm, unspecified: Secondary | ICD-10-CM | POA: Diagnosis not present

## 2023-06-22 DIAGNOSIS — Z7982 Long term (current) use of aspirin: Secondary | ICD-10-CM | POA: Diagnosis not present

## 2023-06-22 DIAGNOSIS — Z7984 Long term (current) use of oral hypoglycemic drugs: Secondary | ICD-10-CM | POA: Diagnosis not present

## 2023-06-22 DIAGNOSIS — S59911A Unspecified injury of right forearm, initial encounter: Secondary | ICD-10-CM | POA: Diagnosis present

## 2023-06-22 DIAGNOSIS — R000 Tachycardia, unspecified: Secondary | ICD-10-CM | POA: Diagnosis not present

## 2023-06-22 DIAGNOSIS — Z79899 Other long term (current) drug therapy: Secondary | ICD-10-CM | POA: Diagnosis not present

## 2023-06-22 DIAGNOSIS — W19XXXA Unspecified fall, initial encounter: Secondary | ICD-10-CM | POA: Diagnosis not present

## 2023-06-22 MED ORDER — LIDOCAINE HCL (PF) 2 % IJ SOLN
INTRAMUSCULAR | Status: AC
  Filled 2023-06-22: qty 5

## 2023-06-22 MED ORDER — LIDOCAINE-EPINEPHRINE (PF) 2 %-1:200000 IJ SOLN
INTRAMUSCULAR | Status: AC
  Filled 2023-06-22: qty 20

## 2023-06-22 NOTE — ED Triage Notes (Signed)
Pt fell about 3 feet on a ladder and cut his right arm on some window glass.
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
## 2023-06-24 ENCOUNTER — Telehealth (HOSPITAL_COMMUNITY): Payer: Medicare HMO | Admitting: Psychiatry

## 2023-06-24 ENCOUNTER — Encounter (HOSPITAL_COMMUNITY): Payer: Self-pay | Admitting: Psychiatry

## 2023-06-24 DIAGNOSIS — F321 Major depressive disorder, single episode, moderate: Secondary | ICD-10-CM | POA: Diagnosis not present

## 2023-06-24 MED ORDER — BUPROPION HCL ER (XL) 300 MG PO TB24
ORAL_TABLET | ORAL | 2 refills | Status: DC
Start: 2023-06-24 — End: 2023-11-02

## 2023-06-24 MED ORDER — VILAZODONE HCL 10 MG PO TABS: 10.0000 mg | ORAL_TABLET | Freq: Every day | ORAL | 2 refills | Status: DC

## 2023-06-24 MED ORDER — ALPRAZOLAM 1 MG PO TABS: ORAL_TABLET | ORAL | 2 refills | Status: DC
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
## 2023-06-26 ENCOUNTER — Telehealth: Payer: Self-pay

## 2023-06-26 NOTE — Transitions of Care (Post Inpatient/ED Visit) (Signed)
   06/26/2023  Name: CHADD WILLADSEN MRN: [id number] DOB: Feb 26, 1956  Today's TOC FU Call Status: Today's TOC FU Call Status:: Unsuccessful Call (1st Attempt) Unsuccessful Call (1st Attempt) Date: 06/26/23  Red on EMMI-ED Discharge Alert Date & Reason:06/25/23 "Scheduled follow-up appt? No"   Attempted to reach the patient regarding the most recent Inpatient/ED visit.  Follow Up Plan: Additional outreach attempts will be made to reach the patient to complete the Transitions of Care (Post Inpatient/ED visit) call.     Antionette Fairy, RN,BSN,CCM [facility] Care Management Care Management Community Coordinator Direct Phone: [phone] Toll Free: [phone] Fax: [phone]

## 2023-06-27 ENCOUNTER — Emergency Department (HOSPITAL_COMMUNITY)
Admission: EM | Admit: 2023-06-27 | Discharge: 2023-06-27 | Disposition: A | Discharge status: Home / Self Care | Payer: Medicare HMO | Attending: Emergency Medicine | Admitting: Emergency Medicine

## 2023-06-27 ENCOUNTER — Encounter (HOSPITAL_COMMUNITY): Payer: Self-pay

## 2023-06-27 ENCOUNTER — Other Ambulatory Visit: Payer: Self-pay

## 2023-06-27 DIAGNOSIS — Z87891 Personal history of nicotine dependence: Secondary | ICD-10-CM | POA: Insufficient documentation

## 2023-06-27 DIAGNOSIS — Z4801 Encounter for change or removal of surgical wound dressing: Secondary | ICD-10-CM | POA: Diagnosis not present

## 2023-06-27 DIAGNOSIS — Z5189 Encounter for other specified aftercare: Secondary | ICD-10-CM

## 2023-06-27 DIAGNOSIS — E119 Type 2 diabetes mellitus without complications: Secondary | ICD-10-CM | POA: Insufficient documentation

## 2023-06-27 DIAGNOSIS — I110 Hypertensive heart disease with heart failure: Secondary | ICD-10-CM | POA: Diagnosis not present

## 2023-06-27 DIAGNOSIS — I509 Heart failure, unspecified: Secondary | ICD-10-CM | POA: Insufficient documentation

## 2023-06-27 DIAGNOSIS — Z7984 Long term (current) use of oral hypoglycemic drugs: Secondary | ICD-10-CM | POA: Diagnosis not present

## 2023-06-27 DIAGNOSIS — Z7982 Long term (current) use of aspirin: Secondary | ICD-10-CM | POA: Diagnosis not present

## 2023-06-27 DIAGNOSIS — Z79899 Other long term (current) drug therapy: Secondary | ICD-10-CM | POA: Insufficient documentation

## 2023-06-27 MED ORDER — MUPIROCIN 2 % EX OINT: 1.0000 | TOPICAL_OINTMENT | Freq: Two times a day (BID) | CUTANEOUS | 0 refills | Status: DC

## 2023-06-27 MED ORDER — CEFADROXIL 500 MG PO CAPS: 500.0000 mg | ORAL_CAPSULE | Freq: Two times a day (BID) | ORAL | 0 refills | Status: DC

## 2023-06-27 NOTE — ED Triage Notes (Signed)
Lac sutured on Monday Pt stated that he has not cleaned it or put anything on it  Pt stated that he has been changing bandage but was told to keep it dry so he has not washed it.   Pt complains of oozing from wound

## 2023-06-27 NOTE — Discharge Instructions (Addendum)
As discussed, visit to the emergency department today overall reassuring.  Wound looks like it is healing well without signs of infection at this time.  Will place you on antibiotics just in case infection develops.  Recommend washing area gently with warm soapy water as well as daily bandage changes.  Follow-up for suture removal as discussed.  Please do not hesitate to return to emergency department if there are worrisome signs and symptoms we discussed become apparent.

## 2023-06-27 NOTE — ED Provider Notes (Signed)
[facility] EMERGENCY DEPARTMENT AT [facility] Provider Note   CSN: [id number] Arrival date & time: 06/27/23  1240     History  Chief Complaint  Patient presents with   Wound Check    Anthony Price is a 67 y.o. male.   Wound Check   67 year old male presents emergency department with request of wound check.  Patient had laceration repaired on Monday after he was cut with a piece of glass.  States he is noticed some clear yellow drainage from wound.  States that he bought some over-the-counter nonadherent dressing.  Wound seems to be scabbing up and dressing.  Denies any fever, redness, increased pain.  States that he is supposed to have sutures out on Monday but wants to make sure it is healing well.  Past medical history significant for CHF, MI, diabetes mellitus type 2, GERD, hypercholesterolemia, hypertension, C. difficile, heart transplant  Home Medications Prior to Admission medications   Medication Sig Start Date End Date Taking? Authorizing Provider  cefadroxil (DURICEF) 500 MG capsule Take 1 capsule (500 mg total) by mouth 2 (two) times daily. 06/27/23  Yes Sherian Maroon A, PA  mupirocin ointment (BACTROBAN) 2 % Apply 1 Application topically 2 (two) times daily. 06/27/23  Yes Peter Garter, PA  ACCU-CHEK SOFTCLIX LANCETS lancets Test once qd. DX E11.9 06/19/16   Dettinger, Elige Radon, MD  acetaminophen (TYLENOL) 500 MG tablet Take 1,000 mg by mouth every 6 (six) hours as needed for moderate pain. Pain    [provider]  ALPRAZolam (XANAX) 1 MG tablet TAKE (1) TABLET BY MOUTH THREE TIMES DAILY. 06/24/23   Myrlene Broker, MD  amLODipine (NORVASC) 2.5 MG tablet Take 1 tablet (2.5 mg total) by mouth daily. 05/01/23   Dettinger, Elige Radon, MD  aspirin EC 81 MG tablet Take 81 mg by mouth daily.    [provider]  atorvastatin (LIPITOR) 40 MG tablet TAKE ONE TABLET BY MOUTH ONCE DAILY. 02/23/23   Dettinger, Elige Radon, MD  Blood Glucose Monitoring  Suppl (ACCU-CHEK AVIVA PLUS) w/Device KIT Test qd. DX E11.9 07/04/20   Dettinger, Elige Radon, MD  buPROPion (WELLBUTRIN XL) 300 MG 24 hr tablet TAKE 1 TABLET BY MOUTH ONCE EVERY MORNING. 06/24/23   Myrlene Broker, MD  carvedilol (COREG) 12.5 MG tablet TAKE (1) TABLET BY MOUTH TWICE DAILY. 02/23/23   Dettinger, Elige Radon, MD  dapagliflozin propanediol (FARXIGA) 10 MG TABS tablet Take 1 tablet (10 mg total) by mouth daily before breakfast. 01/29/23   Dettinger, Elige Radon, MD  esomeprazole (NEXIUM) 20 MG capsule TAKE (1) CAPSULE BY MOUTH TWICE DAILYBEFORE A MEAL 02/23/23   Dettinger, Elige Radon, MD  glucose blood (ACCU-CHEK AVIVA PLUS) test strip Test once qd. DX E11.9 06/19/16   Dettinger, Elige Radon, MD  Insulin Pen Needle (PEN NEEDLES 31GX5/16") 31G X 8 MM MISC 1 each by Does not apply route daily. 04/16/22   Dettinger, Elige Radon, MD  magnesium 30 MG tablet Take 30 mg by mouth 2 (two) times daily.    [provider]  metFORMIN (GLUCOPHAGE) 500 MG tablet Take 1 tablet (500 mg total) by mouth 2 (two) times daily with a meal. 05/01/23   Dettinger, Elige Radon, MD  mycophenolate (CELLCEPT) 500 MG tablet Take 500-1,000 mg by mouth See admin instructions. Take 500 mg by mouth in the morning and 1000 mg at night    [provider]  Omega-3 1000 MG CAPS Take 1,000 mg by mouth in the morning  and at bedtime.    [provider]  Probiotic Product (PROBIOTIC DAILY PO) Take 1 capsule by mouth daily.     [provider]  ramipril (ALTACE) 2.5 MG capsule Take 2.5 mg by mouth daily.    [provider]  Semaglutide,0.25 or 0.5MG /DOS, 2 MG/3ML SOPN Inject 0.25 mg into the skin once a week. 05/01/23   Dettinger, Elige Radon, MD  sildenafil (REVATIO) 20 MG tablet Take 1-5 tablets (20-100 mg total) by mouth as needed. 05/01/23   Dettinger, Elige Radon, MD  tacrolimus (PROGRAF) 0.5 MG capsule Take 0.5 mg by mouth 2 (two) times daily. 11/30/20   [provider]  Vilazodone HCl (VIIBRYD) 10 MG TABS Take  1 tablet (10 mg total) by mouth daily. 06/24/23   Myrlene Broker, MD      Allergies    Lactose intolerance (gi) and Tramadol    Review of Systems   Review of Systems  All other systems reviewed and are negative.   Physical Exam Updated Vital Signs BP (!) 148/96 (BP Location: Left Arm)   Pulse 95   Temp 98 F (36.7 C) (Oral)   Resp 18   Ht 5\' 7"  (1.702 m)   Wt 63.5 kg   SpO2 100%   BMI 21.93 kg/m  Physical Exam Vitals and nursing note reviewed.  Constitutional:      General: He is not in acute distress.    Appearance: He is well-developed.  HENT:     Head: Normocephalic and atraumatic.  Eyes:     Conjunctiva/sclera: Conjunctivae normal.  Cardiovascular:     Rate and Rhythm: Normal rate and regular rhythm.  Pulmonary:     Effort: Pulmonary effort is normal. No respiratory distress.     Breath sounds: Normal breath sounds. No wheezing, rhonchi or rales.  Abdominal:     Palpations: Abdomen is soft.     Tenderness: There is no abdominal tenderness.  Musculoskeletal:        General: No swelling.     Cervical back: Neck supple.     Right lower leg: No edema.     Left lower leg: No edema.  Skin:    General: Skin is warm and dry.     Capillary Refill: Capillary refill takes less than 2 seconds.     Comments: Patient with repaired laceration noted on the proximal aspect of anterior forearm.  Wound well-approximated with some yellow serous drainage appreciated from medial aspect of wound.  No surrounding erythema, induration or palpable fluctuance.  No expressible purulent drainage.  Area not significantly tender to palpation.  Area not warm to palpation.  No sensory deficit distally.  Patient with full range of motion bilateral wrist, digits, elbow.  Neurological:     Mental Status: He is alert.  Psychiatric:        Mood and Affect: Mood normal.     ED Results / Procedures / Treatments   Labs (all labs ordered are listed, but only abnormal results are displayed) Labs  Reviewed - No data to display  EKG None  Radiology No results found.  Procedures Procedures    Medications Ordered in ED Medications - No data to display  ED Course/ Medical Decision Making/ A&P                                 Medical Decision Making Risk Prescription drug management.   This patient presents to the  ED for concern of wound check, this involves an extensive number of treatment options, and is a complaint that carries with it a high risk of complications and morbidity.  The differential diagnosis includes dehiscence, cellulitis, abscess, erysipelas   Co morbidities that complicate the patient evaluation  See HPI   Additional history obtained:  Additional history obtained from EMR External records from outside source obtained and reviewed including hospital records   Lab Tests:  N/a   Imaging Studies ordered:  N/a   Cardiac Monitoring: / EKG:  The patient was maintained on a cardiac monitor.  I personally viewed and interpreted the cardiac monitored which showed an underlying rhythm of: Sinus rhythm   Consultations Obtained:  N/a   Problem List / ED Course / Critical interventions / Medication management  Wound check Reevaluation of the patient showed that the patient stayed the same I have reviewed the patients home medicines and have made adjustments as needed   Social Determinants of Health:  Former cigarette use.  Denies illicit drug use.   Test / Admission - Considered:  Wound check Vitals signs significant for slight hypertension blood pressure 140/96. Otherwise within normal range and stable throughout visit. 67 year old male presents emergency department with request of wound check.  Patient had sutures placed on Monday after suffering a laceration from a piece of glass.  Wound seems well approximated without surrounding erythema, induration or palpable fluctuance.  Mild serous drainage appreciated from wound.  No clinical  evidence currently for secondary infectious process.  Given patient's multiple comorbidities and patient's concern for possible developing infection, will place patient on empiric antibiotics for prophylactic coverage.  Will again stress proper wound care at home.  Further workup deemed necessary at this time while in the ED.  Treatment plan discussed at length with patient and he acknowledged understanding was agreeable to said plan.  Patient overall well-appearing, afebrile in no acute distress. Worrisome signs and symptoms were discussed with the patient, and the patient acknowledged understanding to return to the ED if noticed. Patient was stable upon discharge.          Final Clinical Impression(s) / ED Diagnoses Final diagnoses:  Visit for wound check    Rx / DC Orders ED Discharge Orders          Ordered    cefadroxil (DURICEF) 500 MG capsule  2 times daily        06/27/23 1334    mupirocin ointment (BACTROBAN) 2 %  2 times daily        06/27/23 1334              Peter Garter, [place] 06/27/23 1434    Bethann Berkshire, MD 06/27/23 1635

## 2023-06-30 ENCOUNTER — Telehealth: Payer: Self-pay

## 2023-06-30 NOTE — Transitions of Care (Post Inpatient/ED Visit) (Signed)
   06/30/2023  Name: Anthony Price MRN: [id number] DOB: [birth year]  Today's TOC FU Call Status: Today's TOC FU Call Status:: Unsuccessful Call (2nd Attempt) Unsuccessful Call (2nd Attempt) Date: 06/30/23  Attempted to reach the patient regarding the most recent Inpatient/ED visit.  Follow Up Plan: Additional outreach attempts will be made to reach the patient to complete the Transitions of Care (Post Inpatient/ED visit) call.    Antionette Fairy, RN,BSN,CCM [facility] Care Management Care Management Community Coordinator Direct Phone: [phone] Toll Free: [phone] Fax: [phone]

## 2023-07-01 ENCOUNTER — Telehealth: Payer: Self-pay

## 2023-07-01 NOTE — Transitions of Care (Post Inpatient/ED Visit) (Signed)
   07/01/2023  Name: Anthony Price MRN: [id number] DOB: 07-04-1956  Today's TOC FU Call Status: Today's TOC FU Call Status:: Unsuccessful Call (3rd Attempt) Unsuccessful Call (3rd Attempt) Date: 07/01/23  Attempted to reach the patient regarding the most recent Inpatient/ED visit.  Follow Up Plan: No further outreach attempts will be made at this time. We have been unable to contact the patient.    Antionette Fairy, RN,BSN,CCM [facility] Care Management Care Management Community Coordinator Direct Phone: [phone] Toll Free: [phone] Fax: [phone]

## 2023-07-03 ENCOUNTER — Emergency Department (HOSPITAL_COMMUNITY)
Admission: EM | Admit: 2023-07-03 | Discharge: 2023-07-03 | Disposition: A | Discharge status: Home / Self Care | Payer: Medicare HMO | Attending: Emergency Medicine | Admitting: Emergency Medicine

## 2023-07-03 ENCOUNTER — Other Ambulatory Visit: Payer: Self-pay

## 2023-07-03 ENCOUNTER — Encounter (HOSPITAL_COMMUNITY): Payer: Self-pay | Admitting: *Deleted

## 2023-07-03 DIAGNOSIS — S51811D Laceration without foreign body of right forearm, subsequent encounter: Secondary | ICD-10-CM | POA: Insufficient documentation

## 2023-07-03 DIAGNOSIS — Z4802 Encounter for removal of sutures: Secondary | ICD-10-CM | POA: Diagnosis not present

## 2023-07-03 DIAGNOSIS — W25XXXD Contact with sharp glass, subsequent encounter: Secondary | ICD-10-CM | POA: Diagnosis not present

## 2023-07-03 DIAGNOSIS — Z7982 Long term (current) use of aspirin: Secondary | ICD-10-CM | POA: Diagnosis not present

## 2023-07-03 DIAGNOSIS — Z4889 Encounter for other specified surgical aftercare: Secondary | ICD-10-CM

## 2023-07-03 DIAGNOSIS — Z4801 Encounter for change or removal of surgical wound dressing: Secondary | ICD-10-CM | POA: Diagnosis not present

## 2023-07-03 NOTE — ED Provider Notes (Signed)
[place] EMERGENCY DEPARTMENT AT [facility] Provider Note   CSN: [id number] Arrival date & time: 07/03/23  1018     History  Chief Complaint  Patient presents with   Suture / Staple Removal    Anthony Price is a 67 y.o. male.  Presents for suture check to right forearm.  Had sutures placed in this ER after a laceration from glass on 8/26.  He returned to the ER on 8/31 due to drainage was given oral and topical antibiotics at that time.  States the drainage has simply improved, there is no redness, no tenderness, only small amount of clear yellow drainage.  He states he was told when the antibiotics were finished she should return for suture removal so he came in today.  Denies fevers or chills or other complaints   Suture / Staple Removal       Home Medications Prior to Admission medications   Medication Sig Start Date End Date Taking? Authorizing Provider  ACCU-CHEK SOFTCLIX LANCETS lancets Test once qd. DX E11.9 06/19/16   Dettinger, Elige Radon, MD  acetaminophen (TYLENOL) 500 MG tablet Take 1,000 mg by mouth every 6 (six) hours as needed for moderate pain. Pain    [provider]  ALPRAZolam (XANAX) 1 MG tablet TAKE (1) TABLET BY MOUTH THREE TIMES DAILY. 06/24/23   Myrlene Broker, MD  amLODipine (NORVASC) 2.5 MG tablet Take 1 tablet (2.5 mg total) by mouth daily. 05/01/23   Dettinger, Elige Radon, MD  aspirin EC 81 MG tablet Take 81 mg by mouth daily.    [provider]  atorvastatin (LIPITOR) 40 MG tablet TAKE ONE TABLET BY MOUTH ONCE DAILY. 02/23/23   Dettinger, Elige Radon, MD  Blood Glucose Monitoring Suppl (ACCU-CHEK AVIVA PLUS) w/Device KIT Test qd. DX E11.9 07/04/20   Dettinger, Elige Radon, MD  buPROPion (WELLBUTRIN XL) 300 MG 24 hr tablet TAKE 1 TABLET BY MOUTH ONCE EVERY MORNING. 06/24/23   Myrlene Broker, MD  carvedilol (COREG) 12.5 MG tablet TAKE (1) TABLET BY MOUTH TWICE DAILY. 02/23/23   Dettinger, Elige Radon, MD  cefadroxil (DURICEF) 500 MG capsule  Take 1 capsule (500 mg total) by mouth 2 (two) times daily. 06/27/23   Peter Garter, PA  dapagliflozin propanediol (FARXIGA) 10 MG TABS tablet Take 1 tablet (10 mg total) by mouth daily before breakfast. 01/29/23   Dettinger, Elige Radon, MD  esomeprazole (NEXIUM) 20 MG capsule TAKE (1) CAPSULE BY MOUTH TWICE DAILYBEFORE A MEAL 02/23/23   Dettinger, Elige Radon, MD  glucose blood (ACCU-CHEK AVIVA PLUS) test strip Test once qd. DX E11.9 06/19/16   Dettinger, Elige Radon, MD  Insulin Pen Needle (PEN NEEDLES 31GX5/16") 31G X 8 MM MISC 1 each by Does not apply route daily. 04/16/22   Dettinger, Elige Radon, MD  magnesium 30 MG tablet Take 30 mg by mouth 2 (two) times daily.    [provider]  metFORMIN (GLUCOPHAGE) 500 MG tablet Take 1 tablet (500 mg total) by mouth 2 (two) times daily with a meal. 05/01/23   Dettinger, Elige Radon, MD  mupirocin ointment (BACTROBAN) 2 % Apply 1 Application topically 2 (two) times daily. 06/27/23   Peter Garter, PA  mycophenolate (CELLCEPT) 500 MG tablet Take 500-1,000 mg by mouth See admin instructions. Take 500 mg by mouth in the morning and 1000 mg at night    [provider]  Omega-3 1000 MG CAPS Take 1,000 mg by mouth in the morning and at bedtime.  [provider]  Probiotic Product (PROBIOTIC DAILY PO) Take 1 capsule by mouth daily.     [provider]  ramipril (ALTACE) 2.5 MG capsule Take 2.5 mg by mouth daily.    [provider]  Semaglutide,0.25 or 0.5MG /DOS, 2 MG/3ML SOPN Inject 0.25 mg into the skin once a week. 05/01/23   Dettinger, Elige Radon, MD  sildenafil (REVATIO) 20 MG tablet Take 1-5 tablets (20-100 mg total) by mouth as needed. 05/01/23   Dettinger, Elige Radon, MD  tacrolimus (PROGRAF) 0.5 MG capsule Take 0.5 mg by mouth 2 (two) times daily. 11/30/20   [provider]  Vilazodone HCl (VIIBRYD) 10 MG TABS Take 1 tablet (10 mg total) by mouth daily. 06/24/23   Myrlene Broker, MD      Allergies    Lactose intolerance  (gi) and Tramadol    Review of Systems   Review of Systems  Physical Exam Updated Vital Signs BP (!) 145/83 (BP Location: Left Arm)   Pulse 92   Temp 97.6 F (36.4 C) (Oral)   Resp 16   Ht 5\' 7"  (1.702 m)   Wt 63.5 kg   SpO2 100%   BMI 21.93 kg/m  Physical Exam Vitals and nursing note reviewed.  Constitutional:      General: He is not in acute distress.    Appearance: He is well-developed.  HENT:     Head: Normocephalic and atraumatic.  Eyes:     Conjunctiva/sclera: Conjunctivae normal.  Cardiovascular:     Rate and Rhythm: Normal rate and regular rhythm.     Heart sounds: No murmur heard. Pulmonary:     Effort: Pulmonary effort is normal. No respiratory distress.     Breath sounds: Normal breath sounds.  Abdominal:     Palpations: Abdomen is soft.     Tenderness: There is no abdominal tenderness.  Musculoskeletal:        General: No swelling or tenderness. Normal range of motion.     Cervical back: Neck supple.  Skin:    General: Skin is warm and dry.     Capillary Refill: Capillary refill takes less than 2 seconds.     Comments: Sutures intact to right forearm, no active drainage, wound edges are not fully approximated  Neurological:     General: No focal deficit present.     Mental Status: He is alert and oriented to person, place, and time.  Psychiatric:        Mood and Affect: Mood normal.     ED Results / Procedures / Treatments   Labs (all labs ordered are listed, but only abnormal results are displayed) Labs Reviewed - No data to display  EKG None  Radiology No results found.  Procedures Procedures    Medications Ordered in ED Medications - No data to display  ED Course/ Medical Decision Making/ A&P                                 Medical Decision Making Ddx: Wound infection, wound dehiscence, healing wound, other ED course: Patient here for wound check, on exam, the wound edges do not appear to be fully healed, discussed we will give  it several more days and recheck for likely removal.  No active signs of infection.  He has no complaints he is agreeable with plan of care and discharge.  Amount and/or Complexity of Data Reviewed External Data Reviewed: notes.  Final Clinical Impression(s) / ED Diagnoses Final diagnoses:  Suture check    Rx / DC Orders ED Discharge Orders     None         Ma Rings, PA-C 07/03/23 1209    Loetta Rough, MD 07/03/23 1422

## 2023-07-03 NOTE — ED Triage Notes (Signed)
Pt here for suture removal on right arm. Pt reports he came in Monday to have it done but since it was draining they gave him ointment and an antibiotic and said to wait. Pt reports there is still drainage, but not nearly as much. Denies fever.

## 2023-07-03 NOTE — Discharge Instructions (Signed)
A pleasure taking care of you today.  We were here to check your sutures on your right arm.  They likely need several more days to heal so were not removed today.  You should go to your primary care, urgent care or come back here in 3 days to have these removed.  If you have new or worsening symptoms such as swelling redness or drainage come back sooner.  Can continue using the topical antibiotic ointment daily but can keep it otherwise dry.  Only use a thin layer of antibiotic ointment

## 2023-07-07 ENCOUNTER — Emergency Department (HOSPITAL_COMMUNITY)
Admission: EM | Admit: 2023-07-07 | Discharge: 2023-07-07 | Disposition: A | Discharge status: Home / Self Care | Payer: Medicare HMO | Attending: Emergency Medicine | Admitting: Emergency Medicine

## 2023-07-07 ENCOUNTER — Other Ambulatory Visit: Payer: Self-pay

## 2023-07-07 ENCOUNTER — Encounter (HOSPITAL_COMMUNITY): Payer: Self-pay | Admitting: Emergency Medicine

## 2023-07-07 DIAGNOSIS — Z4802 Encounter for removal of sutures: Secondary | ICD-10-CM | POA: Diagnosis not present

## 2023-07-07 DIAGNOSIS — E119 Type 2 diabetes mellitus without complications: Secondary | ICD-10-CM | POA: Insufficient documentation

## 2023-07-07 DIAGNOSIS — S51811D Laceration without foreign body of right forearm, subsequent encounter: Secondary | ICD-10-CM | POA: Diagnosis not present

## 2023-07-07 NOTE — ED Triage Notes (Signed)
Pt presents POV for suture removal from rigth arm. Pt states this is the thrid attempt to get sutures out. Suture have been in 15 days and are no longer drainage.

## 2023-07-07 NOTE — Discharge Instructions (Signed)
I removed 9 sutures out of your 10 today.  It is possible that the other physician is counted, or there is a suture left in your skin.  I had a second individual check, and we did not see any sutures visible.  Return to the ER if it becomes red, swollen, or tender to the touch.  There is a suture still left in there, you could get an infection.

## 2023-07-07 NOTE — ED Provider Notes (Signed)
[place] EMERGENCY DEPARTMENT AT [facility] Provider Note   CSN: [id number] Arrival date & time: 07/07/23  1158     History  Chief Complaint  Patient presents with   Suture / Staple Removal    Anthony Price is a 67 y.o. male, history of type 2 diabetes, who presents to the ED secondary to suture removal.  He states he had stitches placed about 15 days ago, has went to the ER twice, to get them removed, and been told it is not ready yet.  Denies any redness, swelling of the area, but states he has had so much growth over the sutures, that it is hard to figure out where they are.     Allergies    Lactose intolerance (gi) and Tramadol    Review of Systems   Review of Systems  Skin:  Positive for wound. Negative for color change.    Physical Exam Updated Vital Signs BP 124/75   Pulse 79   Temp 98.3 F (36.8 C) (Oral)   Resp 16   Ht 5\' 7"  (1.702 m)   Wt 63 kg   SpO2 100%   BMI 21.75 kg/m  Physical Exam Vitals and nursing note reviewed.  Constitutional:      General: He is not in acute distress.    Appearance: He is well-developed.  HENT:     Head: Normocephalic and atraumatic.  Eyes:     Conjunctiva/sclera: Conjunctivae normal.  Cardiovascular:     Rate and Rhythm: Normal rate and regular rhythm.     Heart sounds: No murmur heard. Pulmonary:     Effort: Pulmonary effort is normal. No respiratory distress.     Breath sounds: Normal breath sounds.  Abdominal:     Palpations: Abdomen is soft.     Tenderness: There is no abdominal tenderness.  Musculoskeletal:        General: No swelling.     Cervical back: Neck supple.  Skin:    General: Skin is warm and dry.     Capillary Refill: Capillary refill takes less than 2 seconds.     Comments: Healing wound to left forearm, with 9 stitches apparent slight overgrowth/healing over wound/stitches  Neurological:     Mental Status: He is alert.  Psychiatric:        Mood and Affect: Mood normal.      ED Results / Procedures / Treatments   Labs (all labs ordered are listed, but only abnormal results are displayed) Labs Reviewed - No data to display  EKG None  Radiology No results found.  Procedures .Suture Removal  Date/Time: 07/07/2023 1:21 PM  Performed by: Pete Pelt, PA Authorized by: Pete Pelt, PA   Consent:    Consent obtained:  Verbal   Consent given by:  Patient   Risks discussed:  Bleeding, pain and wound separation   Alternatives discussed:  No treatment Procedure details:    Wound appearance:  No signs of infection, nonpurulent, good wound healing, nontender, clean, moist and pink   Number of sutures removed:  9 Post-procedure details:    Post-removal:  No dressing applied   Procedure completion:  Tolerated     Medications Ordered in ED Medications - No data to display  ED Course/ Medical Decision Making/ A&P                                 Medical Decision Making  Patient is a 67 year old male, here for suture check, on the original, that the 10 sutures were placed, only found 9 sutures present, even after debriding the wound little bit.  I had a second individual check, the other PA at the ER (Tammy) and she did not see any evidence of any kind of suture present after I removed the initial 9.  It is possible that the patient's initial doctor, miscounted or that the skin over grew the suture.  I discussed with the patient, risk associated with foreign body retention, and he voiced understanding.  He will follow-up with PCP, and return to the ER if he develops any redness, swelling or pain to the area.    Final Clinical Impression(s) / ED Diagnoses Final diagnoses:  Visit for suture removal    Rx / DC Orders ED Discharge Orders     None         Riyan Haile, Harley [place], PA 07/07/23 1338    Gloris Manchester, MD 07/07/23 [phone]

## 2023-07-10 DIAGNOSIS — Z48298 Encounter for aftercare following other organ transplant: Secondary | ICD-10-CM | POA: Diagnosis not present

## 2023-07-10 DIAGNOSIS — Z79899 Other long term (current) drug therapy: Secondary | ICD-10-CM | POA: Diagnosis not present

## 2023-07-10 DIAGNOSIS — R7989 Other specified abnormal findings of blood chemistry: Secondary | ICD-10-CM | POA: Diagnosis not present

## 2023-07-10 DIAGNOSIS — Z941 Heart transplant status: Secondary | ICD-10-CM | POA: Diagnosis not present

## 2023-07-22 DIAGNOSIS — Z79899 Other long term (current) drug therapy: Secondary | ICD-10-CM | POA: Diagnosis not present

## 2023-07-22 DIAGNOSIS — R7989 Other specified abnormal findings of blood chemistry: Secondary | ICD-10-CM | POA: Diagnosis not present

## 2023-07-22 DIAGNOSIS — Z941 Heart transplant status: Secondary | ICD-10-CM | POA: Diagnosis not present

## 2023-07-22 DIAGNOSIS — Z48298 Encounter for aftercare following other organ transplant: Secondary | ICD-10-CM | POA: Diagnosis not present

## 2023-08-05 ENCOUNTER — Ambulatory Visit: Payer: Medicare HMO | Admitting: Family Medicine

## 2023-08-05 ENCOUNTER — Telehealth: Payer: Self-pay

## 2023-08-05 ENCOUNTER — Encounter: Payer: Self-pay | Admitting: Family Medicine

## 2023-08-05 VITALS — BP 125/80 | HR 84 | Ht 67.0 in | Wt 131.0 lb

## 2023-08-05 DIAGNOSIS — E1159 Type 2 diabetes mellitus with other circulatory complications: Secondary | ICD-10-CM

## 2023-08-05 DIAGNOSIS — Z23 Encounter for immunization: Secondary | ICD-10-CM

## 2023-08-05 DIAGNOSIS — N1831 Chronic kidney disease, stage 3a: Secondary | ICD-10-CM

## 2023-08-05 DIAGNOSIS — Z7984 Long term (current) use of oral hypoglycemic drugs: Secondary | ICD-10-CM | POA: Diagnosis not present

## 2023-08-05 DIAGNOSIS — N1832 Chronic kidney disease, stage 3b: Secondary | ICD-10-CM

## 2023-08-05 DIAGNOSIS — N183 Chronic kidney disease, stage 3 unspecified: Secondary | ICD-10-CM | POA: Diagnosis not present

## 2023-08-05 DIAGNOSIS — E1122 Type 2 diabetes mellitus with diabetic chronic kidney disease: Secondary | ICD-10-CM

## 2023-08-05 DIAGNOSIS — I152 Hypertension secondary to endocrine disorders: Secondary | ICD-10-CM

## 2023-08-05 DIAGNOSIS — E781 Pure hyperglyceridemia: Secondary | ICD-10-CM | POA: Diagnosis not present

## 2023-08-05 LAB — BAYER DCA HB A1C WAIVED: HB A1C (BAYER DCA - WAIVED): 6.8 % — ABNORMAL HIGH (ref 4.8–5.6)

## 2023-08-05 MED ORDER — SITAGLIPTIN PHOSPHATE 50 MG PO TABS
50.0000 mg | ORAL_TABLET | Freq: Every day | ORAL | 3 refills | Status: DC
Start: 2023-08-05 — End: 2024-08-23

## 2023-08-05 NOTE — Telephone Encounter (Signed)
Transition Care Management Unsuccessful Follow-up Telephone Call  Date of [facility] and from [facility]  Attempts:  1st Attempt  Reason for unsuccessful TCM follow-up call:  No answer/busy   Lenard Forth [place]  [facility], [facility] Guide, Phone: [phone] Website: [email or web address]

## 2023-08-05 NOTE — Progress Notes (Signed)
BP 125/80   Pulse 84   Ht 5\' 7"  (1.702 m)   Wt 131 lb (59.4 kg)   SpO2 100%   BMI 20.52 kg/m    Subjective:   Patient ID: Anthony Price, male    DOB: 1956-05-01, 67 y.o.   MRN: [id number]  HPI: Anthony Price is a 67 y.o. male presenting on 08/05/2023 for Medical Management of Chronic Issues, Diabetes, and Chronic Kidney Disease   HPI Type 2 diabetes mellitus Patient comes in today for recheck of his diabetes. Patient has been currently taking metformin and Januvia although he ran out of it 2 weeks ago but has been more active and back in work and also takes [place].. Patient is currently on an ACE inhibitor/ARB. Patient has not seen an ophthalmologist this year. Patient denies any new issues with their feet. The symptom started onset as an adult CKD and hypertriglyceridemia and history of heart transplant and hypertension ARE RELATED TO DM   Hypertension Patient is currently on amlodipine and carvedilol and Farxiga and ramipril, and their blood pressure today is 125/80. Patient denies any lightheadedness or dizziness. Patient denies headaches, blurred vision, chest pains, shortness of breath, or weakness. Denies any side effects from medication and is content with current medication.   Hyperlipidemia and hypertriglyceridemia Patient is coming in for recheck of his hyperlipidemia. The patient is currently taking atorvastatin. They deny any issues with myalgias or history of liver damage from it. They deny any focal numbness or weakness or chest pain.   Relevant past medical, surgical, family and social history reviewed and updated as indicated. Interim medical history since our last visit reviewed. Allergies and medications reviewed and updated.  Review of Systems  Constitutional:  Negative for chills and fever.  Eyes:  Negative for visual disturbance.  Respiratory:  Negative for shortness of breath and wheezing.   Cardiovascular:  Negative for chest pain and leg swelling.   Musculoskeletal:  Negative for back pain and gait problem.  Skin:  Negative for rash.  Neurological:  Negative for dizziness, weakness and light-headedness.  All other systems reviewed and are negative.   Per HPI unless specifically indicated above   Allergies as of 08/05/2023       Reactions   Lactose Intolerance (gi)    bloating   Tramadol Nausea Only        Medication List        Accurate as of August 05, 2023 11:02 AM. If you have any questions, ask your nurse or doctor.          STOP taking these medications    Semaglutide(0.25 or 0.5MG /DOS) 2 MG/3ML Sopn Stopped by: Elige Radon Foy Vanduyne       TAKE these medications    Accu-Chek Aviva Plus w/Device Kit Test qd. DX E11.9   Accu-Chek Softclix Lancets lancets Test once qd. DX E11.9   acetaminophen 500 MG tablet Commonly known as: TYLENOL Take 1,000 mg by mouth every 6 (six) hours as needed for moderate pain. Pain   ALPRAZolam 1 MG tablet Commonly known as: XANAX TAKE (1) TABLET BY MOUTH THREE TIMES DAILY.   amLODipine 2.5 MG tablet Commonly known as: NORVASC Take 1 tablet (2.5 mg total) by mouth daily.   aspirin EC 81 MG tablet Take 81 mg by mouth daily.   atorvastatin 40 MG tablet Commonly known as: LIPITOR TAKE ONE TABLET BY MOUTH ONCE DAILY.   buPROPion 300 MG 24 hr tablet Commonly known as: WELLBUTRIN XL TAKE 1 TABLET  BY MOUTH ONCE EVERY MORNING.   carvedilol 12.5 MG tablet Commonly known as: COREG TAKE (1) TABLET BY MOUTH TWICE DAILY.   cefadroxil 500 MG capsule Commonly known as: DURICEF Take 1 capsule (500 mg total) by mouth 2 (two) times daily.   dapagliflozin propanediol 10 MG Tabs tablet Commonly known as: Farxiga Take 1 tablet (10 mg total) by mouth daily before breakfast.   esomeprazole 20 MG capsule Commonly known as: NEXIUM TAKE (1) CAPSULE BY MOUTH TWICE DAILYBEFORE A MEAL   glucose blood test strip Commonly known as: Accu-Chek Aviva Plus Test once qd. DX E11.9    magnesium 30 MG tablet Take 30 mg by mouth 2 (two) times daily.   metFORMIN 500 MG tablet Commonly known as: GLUCOPHAGE Take 1 tablet (500 mg total) by mouth 2 (two) times daily with a meal.   mupirocin ointment 2 % Commonly known as: BACTROBAN Apply 1 Application topically 2 (two) times daily.   mycophenolate 500 MG tablet Commonly known as: CELLCEPT Take 500-1,000 mg by mouth See admin instructions. Take 500 mg by mouth in the morning and 1000 mg at night   Omega-3 1000 MG Caps Take 1,000 mg by mouth in the morning and at bedtime.   PEN NEEDLES 31GX5/16" 31G X 8 MM Misc 1 each by Does not apply route daily.   PROBIOTIC DAILY PO Take 1 capsule by mouth daily.   ramipril 2.5 MG capsule Commonly known as: ALTACE Take 2.5 mg by mouth daily.   sildenafil 20 MG tablet Commonly known as: REVATIO Take 1-5 tablets (20-100 mg total) by mouth as needed.   sitaGLIPtin 50 MG tablet Commonly known as: Januvia Take 1 tablet (50 mg total) by mouth daily. Started by: Elige Radon Daley Mooradian   tacrolimus 0.5 MG capsule Commonly known as: PROGRAF Take 0.5 mg by mouth 2 (two) times daily.   Vilazodone HCl 10 MG Tabs Commonly known as: VIIBRYD Take 1 tablet (10 mg total) by mouth daily.         Objective:   BP 125/80   Pulse 84   Ht 5\' 7"  (1.702 m)   Wt 131 lb (59.4 kg)   SpO2 100%   BMI 20.52 kg/m   Wt Readings from Last 3 Encounters:  08/05/23 131 lb (59.4 kg)  07/07/23 138 lb 14.2 oz (63 kg)  07/03/23 140 lb (63.5 kg)    Physical Exam Vitals and nursing note reviewed.  Constitutional:      General: He is not in acute distress.    Appearance: He is well-developed. He is not diaphoretic.  Eyes:     General: No scleral icterus.    Conjunctiva/sclera: Conjunctivae normal.  Neck:     Thyroid: No thyromegaly.  Cardiovascular:     Rate and Rhythm: Normal rate and regular rhythm.     Heart sounds: Normal heart sounds. No murmur heard. Pulmonary:     Effort:  Pulmonary effort is normal. No respiratory distress.     Breath sounds: Normal breath sounds. No wheezing.  Musculoskeletal:        General: No swelling. Normal range of motion.     Cervical back: Neck supple.  Lymphadenopathy:     Cervical: No cervical adenopathy.  Skin:    General: Skin is warm and dry.     Findings: No rash.  Neurological:     Mental Status: He is alert and oriented to person, place, and time.     Coordination: Coordination normal.  Psychiatric:  Behavior: Behavior normal.       Assessment & Plan:   Problem List Items Addressed This Visit       Cardiovascular and Mediastinum   Hypertension associated with diabetes (HCC)   Relevant Medications   sitaGLIPtin (JANUVIA) 50 MG tablet     Endocrine   Diabetes type 2, controlled (HCC)   Relevant Medications   sitaGLIPtin (JANUVIA) 50 MG tablet     Genitourinary   CKD (chronic kidney disease), stage III (HCC)     Other   Hypertriglyceridemia   Other Visit Diagnoses     Type 2 diabetes mellitus with stage 3b chronic kidney disease, without long-term current use of insulin (HCC)    -  Primary   Relevant Medications   sitaGLIPtin (JANUVIA) 50 MG tablet   Other Relevant Orders   CBC with Differential/Platelet   CMP14+EGFR   Lipid panel   Bayer DCA Hb A1c Waived   Encounter for immunization       Relevant Orders   Flu Vaccine Trivalent High Dose (Fluad) (Completed)       Will restart Januvia, he seems to be doing better on it especially now that he is back and working back active.  A1c was 6.8 today which was good. Follow up plan: Return in about 3 months (around 11/05/2023), or if symptoms worsen or fail to improve, for Diabetes recheck.  Counseling provided for all of the vaccine components Orders Placed This Encounter  Procedures   Flu Vaccine Trivalent High Dose (Fluad)   CBC with Differential/Platelet   CMP14+EGFR   Lipid panel   Bayer DCA Hb A1c Waived    Arville Care,  MD [facility] 08/05/2023, 11:02 AM

## 2023-08-06 ENCOUNTER — Telehealth: Payer: Self-pay

## 2023-08-06 LAB — CMP14+EGFR
ALT: 10 IU/L (ref 0–44)
AST: 15 IU/L (ref 0–40)
Albumin: 4.9 g/dL (ref 3.9–4.9)
Alkaline Phosphatase: 51 IU/L (ref 44–121)
BUN/Creatinine Ratio: 14 (ref 10–24)
BUN: 23 mg/dL (ref 8–27)
Bilirubin Total: 0.4 mg/dL (ref 0.0–1.2)
CO2: 20 mmol/L (ref 20–29)
Calcium: 9.3 mg/dL (ref 8.6–10.2)
Chloride: 101 mmol/L (ref 96–106)
Creatinine, Ser: 1.65 mg/dL — ABNORMAL HIGH (ref 0.76–1.27)
Globulin, Total: 2.3 g/dL (ref 1.5–4.5)
Glucose: 255 mg/dL — ABNORMAL HIGH (ref 70–99)
Potassium: 4.5 mmol/L (ref 3.5–5.2)
Sodium: 137 mmol/L (ref 134–144)
Total Protein: 7.2 g/dL (ref 6.0–8.5)
eGFR: 46 mL/min/{1.73_m2} — ABNORMAL LOW (ref >59)

## 2023-08-06 LAB — LIPID PANEL
Cholesterol, Total: 106 mg/dL (ref 100–199)
HDL: 22 mg/dL — ABNORMAL LOW (ref >39)
LDL CALC COMMENT:: 4.8 ratio (ref 0.0–5.0)
LDL Chol Calc (NIH): 43 mg/dL (ref 0–99)
Triglycerides: 259 mg/dL — ABNORMAL HIGH (ref 0–149)
VLDL Cholesterol Cal: 41 mg/dL — ABNORMAL HIGH (ref 5–40)

## 2023-08-06 LAB — CBC WITH DIFFERENTIAL/PLATELET
Basophils Absolute: 0 10*3/uL (ref 0.0–0.2)
Basos: 1 %
EOS (ABSOLUTE): 0.1 10*3/uL (ref 0.0–0.4)
Eos: 1 %
Hematocrit: 40.9 % (ref 37.5–51.0)
Hemoglobin: 11.8 g/dL — ABNORMAL LOW (ref 13.0–17.7)
Immature Grans (Abs): 0 10*3/uL (ref 0.0–0.1)
Immature Granulocytes: 0 %
Lymphocytes Absolute: 1.9 10*3/uL (ref 0.7–3.1)
Lymphs: 37 %
MCH: 22.9 pg — ABNORMAL LOW (ref 26.6–33.0)
MCHC: 28.9 g/dL — ABNORMAL LOW (ref 31.5–35.7)
MCV: 79 fL (ref 79–97)
Monocytes Absolute: 0.5 10*3/uL (ref 0.1–0.9)
Monocytes: 9 %
Neutrophils Absolute: 2.6 10*3/uL (ref 1.4–7.0)
Neutrophils: 52 %
Platelets: 159 10*3/uL (ref 150–450)
RBC: 5.15 x10E6/uL (ref 4.14–5.80)
RDW: 15 % (ref 11.6–15.4)
WBC: 5.1 10*3/uL (ref 3.4–10.8)

## 2023-08-06 NOTE — Telephone Encounter (Signed)
Transition Care Management Unsuccessful Follow-up Telephone Call  Date of [facility] and from [facility]  Attempts:  2nd Attempt  Reason for unsuccessful TCM follow-up call:  No answer/busy   Lenard Forth [place]  [facility], [facility] Guide, Phone: [phone] Website: [email or web address]

## 2023-08-25 ENCOUNTER — Other Ambulatory Visit: Payer: Self-pay | Admitting: *Deleted

## 2023-08-25 DIAGNOSIS — K219 Gastro-esophageal reflux disease without esophagitis: Secondary | ICD-10-CM

## 2023-08-25 DIAGNOSIS — I10 Essential (primary) hypertension: Secondary | ICD-10-CM

## 2023-08-25 DIAGNOSIS — I152 Hypertension secondary to endocrine disorders: Secondary | ICD-10-CM

## 2023-08-25 DIAGNOSIS — E781 Pure hyperglyceridemia: Secondary | ICD-10-CM

## 2023-08-25 MED ORDER — ATORVASTATIN CALCIUM 40 MG PO TABS: 40.0000 mg | ORAL_TABLET | Freq: Every day | ORAL | 1 refills | Status: DC

## 2023-08-25 MED ORDER — ESOMEPRAZOLE MAGNESIUM 20 MG PO CPDR: DELAYED_RELEASE_CAPSULE | ORAL | 0 refills | Status: DC

## 2023-08-25 MED ORDER — CARVEDILOL 12.5 MG PO TABS
ORAL_TABLET | ORAL | 1 refills | Status: DC
Start: 2023-08-25 — End: 2024-02-11

## 2023-09-13 ENCOUNTER — Other Ambulatory Visit (HOSPITAL_COMMUNITY): Payer: Self-pay | Admitting: Psychiatry

## 2023-09-16 DIAGNOSIS — Z79899 Other long term (current) drug therapy: Secondary | ICD-10-CM | POA: Diagnosis not present

## 2023-09-16 DIAGNOSIS — F32A Depression, unspecified: Secondary | ICD-10-CM | POA: Diagnosis not present

## 2023-09-16 DIAGNOSIS — I2584 Coronary atherosclerosis due to calcified coronary lesion: Secondary | ICD-10-CM | POA: Diagnosis not present

## 2023-09-16 DIAGNOSIS — K219 Gastro-esophageal reflux disease without esophagitis: Secondary | ICD-10-CM | POA: Diagnosis not present

## 2023-09-16 DIAGNOSIS — N189 Chronic kidney disease, unspecified: Secondary | ICD-10-CM | POA: Diagnosis not present

## 2023-09-16 DIAGNOSIS — Z941 Heart transplant status: Secondary | ICD-10-CM | POA: Diagnosis not present

## 2023-09-16 DIAGNOSIS — I2510 Atherosclerotic heart disease of native coronary artery without angina pectoris: Secondary | ICD-10-CM | POA: Diagnosis not present

## 2023-09-16 DIAGNOSIS — I129 Hypertensive chronic kidney disease with stage 1 through stage 4 chronic kidney disease, or unspecified chronic kidney disease: Secondary | ICD-10-CM | POA: Diagnosis not present

## 2023-09-16 DIAGNOSIS — Z23 Encounter for immunization: Secondary | ICD-10-CM | POA: Diagnosis not present

## 2023-09-16 DIAGNOSIS — E785 Hyperlipidemia, unspecified: Secondary | ICD-10-CM | POA: Diagnosis not present

## 2023-09-16 DIAGNOSIS — Z8774 Personal history of (corrected) congenital malformations of heart and circulatory system: Secondary | ICD-10-CM | POA: Diagnosis not present

## 2023-09-16 DIAGNOSIS — E1122 Type 2 diabetes mellitus with diabetic chronic kidney disease: Secondary | ICD-10-CM | POA: Diagnosis not present

## 2023-09-16 DIAGNOSIS — F419 Anxiety disorder, unspecified: Secondary | ICD-10-CM | POA: Diagnosis not present

## 2023-09-16 DIAGNOSIS — R9431 Abnormal electrocardiogram [ECG] [EKG]: Secondary | ICD-10-CM | POA: Diagnosis not present

## 2023-09-17 DIAGNOSIS — Z48298 Encounter for aftercare following other organ transplant: Secondary | ICD-10-CM | POA: Diagnosis not present

## 2023-09-17 DIAGNOSIS — Z79899 Other long term (current) drug therapy: Secondary | ICD-10-CM | POA: Diagnosis not present

## 2023-09-17 DIAGNOSIS — H524 Presbyopia: Secondary | ICD-10-CM | POA: Diagnosis not present

## 2023-09-17 DIAGNOSIS — H5213 Myopia, bilateral: Secondary | ICD-10-CM | POA: Diagnosis not present

## 2023-09-17 DIAGNOSIS — E785 Hyperlipidemia, unspecified: Secondary | ICD-10-CM | POA: Diagnosis not present

## 2023-09-17 DIAGNOSIS — E559 Vitamin D deficiency, unspecified: Secondary | ICD-10-CM | POA: Diagnosis not present

## 2023-09-17 DIAGNOSIS — Z941 Heart transplant status: Secondary | ICD-10-CM | POA: Diagnosis not present

## 2023-09-17 DIAGNOSIS — R7989 Other specified abnormal findings of blood chemistry: Secondary | ICD-10-CM | POA: Diagnosis not present

## 2023-09-17 LAB — HM DIABETES EYE EXAM

## 2023-09-25 ENCOUNTER — Other Ambulatory Visit: Payer: Self-pay | Admitting: Family Medicine

## 2023-09-25 DIAGNOSIS — N529 Male erectile dysfunction, unspecified: Secondary | ICD-10-CM

## 2023-09-30 ENCOUNTER — Ambulatory Visit (INDEPENDENT_AMBULATORY_CARE_PROVIDER_SITE_OTHER): Payer: Medicare HMO | Admitting: Family Medicine

## 2023-09-30 ENCOUNTER — Encounter: Payer: Self-pay | Admitting: Family Medicine

## 2023-09-30 VITALS — BP 132/79 | HR 88 | Ht 67.0 in | Wt 137.0 lb

## 2023-09-30 DIAGNOSIS — Z0000 Encounter for general adult medical examination without abnormal findings: Secondary | ICD-10-CM
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
## 2023-11-02 ENCOUNTER — Encounter (HOSPITAL_COMMUNITY): Payer: Self-pay | Admitting: Psychiatry

## 2023-11-02 ENCOUNTER — Telehealth (INDEPENDENT_AMBULATORY_CARE_PROVIDER_SITE_OTHER): Payer: 59 | Admitting: Psychiatry

## 2023-11-02 DIAGNOSIS — F321 Major depressive disorder, single episode, moderate: Secondary | ICD-10-CM | POA: Diagnosis not present

## 2023-11-02 MED ORDER — VILAZODONE HCL 10 MG PO TABS: 10.0000 mg | ORAL_TABLET | Freq: Every day | ORAL | 2 refills | Status: DC

## 2023-11-02 MED ORDER — ALPRAZOLAM 1 MG PO TABS: ORAL_TABLET | ORAL | 2 refills | Status: DC

## 2023-11-02 MED ORDER — BUPROPION HCL ER (XL) 300 MG PO TB24
ORAL_TABLET | ORAL | 2 refills | Status: DC
Start: 2023-11-02 — End: 2024-03-01
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
## 2023-11-05 ENCOUNTER — Ambulatory Visit: Payer: 59 | Admitting: Family Medicine

## 2023-11-05 ENCOUNTER — Encounter: Payer: Self-pay | Admitting: Family Medicine

## 2023-11-05 VITALS — BP 108/55 | HR 91 | Temp 97.4°F | Ht 67.0 in | Wt 136.0 lb

## 2023-11-05 DIAGNOSIS — K219 Gastro-esophageal reflux disease without esophagitis: Secondary | ICD-10-CM | POA: Diagnosis not present

## 2023-11-05 DIAGNOSIS — E781 Pure hyperglyceridemia: Secondary | ICD-10-CM | POA: Diagnosis not present

## 2023-11-05 DIAGNOSIS — I152 Hypertension secondary to endocrine disorders: Secondary | ICD-10-CM

## 2023-11-05 DIAGNOSIS — N1831 Chronic kidney disease, stage 3a: Secondary | ICD-10-CM

## 2023-11-05 DIAGNOSIS — N1832 Chronic kidney disease, stage 3b: Secondary | ICD-10-CM | POA: Diagnosis not present

## 2023-11-05 DIAGNOSIS — Z7984 Long term (current) use of oral hypoglycemic drugs: Secondary | ICD-10-CM | POA: Diagnosis not present

## 2023-11-05 DIAGNOSIS — E1122 Type 2 diabetes mellitus with diabetic chronic kidney disease: Secondary | ICD-10-CM | POA: Diagnosis not present

## 2023-11-05 DIAGNOSIS — N183 Chronic kidney disease, stage 3 unspecified: Secondary | ICD-10-CM

## 2023-11-05 DIAGNOSIS — E1159 Type 2 diabetes mellitus with other circulatory complications: Secondary | ICD-10-CM

## 2023-11-05 LAB — BAYER DCA HB A1C WAIVED: HB A1C (BAYER DCA - WAIVED): 7.1 % — ABNORMAL HIGH (ref 4.8–5.6)

## 2023-11-05 MED ORDER — DAPAGLIFLOZIN PROPANEDIOL 10 MG PO TABS
10.0000 mg | ORAL_TABLET | Freq: Every day | ORAL | 3 refills | Status: AC
Start: 2023-11-05 — End: ?

## 2023-11-05 MED ORDER — ESOMEPRAZOLE MAGNESIUM 20 MG PO CPDR: DELAYED_RELEASE_CAPSULE | ORAL | 3 refills | Status: DC

## 2023-11-05 NOTE — Progress Notes (Addendum)
 BP (!) 108/55   Pulse 91   Temp (!) 97.4 F (36.3 C)   Ht 5' 7 (1.702 m)   Wt 136 lb (61.7 kg)   SpO2 100%   BMI 21.30 kg/m    Subjective:   Patient ID: Anthony Price, male    DOB: June 03, 1956, 68 y.o.   MRN: [id number]  HPI: Anthony Price is a 68 y.o. male presenting on 11/05/2023 for Medical Management of Chronic Issues, Chronic Kidney Disease, and Diabetes  Type 2 diabetes mellitus Patient comes in today for recheck of his diabetes. Patient is currently taking metformin , Farxiga , and sitagliptin . Patient has seen an ophthalmologist this year. Patient denies any new issues with his feet. His diabetes is complicated by hypertension, hyperlipidemia, and CKD.   Hypertension Patient is currently taking amlodipine , carvedilol , and ramipril . His blood pressure today is 108/55. He does occasionally experience lightheadedness upon standing, but he is able to lie down before falling. He denies dizziness, headaches, vision changes, chest pain, or shortness of breath.    Hyperlipidemia and hypertriglyceridemia Patient is currently taking atorvastatin  and fish oil. He denies myalgias or weakness. He does not have a history of liver damage from it.   Anemia Patient is currently taking iron supplements. He initially struggled with constipation from the iron, but this has since resolved since taking the iron every other day.  Relevant past medical, surgical, family and social history reviewed and updated as indicated. Interim medical history since our last visit reviewed. Allergies and medications reviewed and updated.  Review of Systems  Constitutional:  Negative for chills and fever.  HENT:  Negative for congestion and sore throat.   Eyes:  Negative for visual disturbance.  Respiratory:  Negative for cough, chest tightness and shortness of breath.   Cardiovascular:  Negative for chest pain and leg swelling.  Gastrointestinal:  Negative for abdominal pain, constipation and diarrhea.   Endocrine: Negative for polyuria.  Genitourinary:  Negative for difficulty urinating, dysuria and hematuria.  Musculoskeletal:  Negative for myalgias.  Neurological:  Positive for light-headedness. Negative for dizziness, syncope, weakness and headaches.  Psychiatric/Behavioral:  Positive for dysphoric mood. The patient is nervous/anxious (follows with behavioral health).    Per HPI unless specifically indicated above  Allergies as of 11/05/2023       Reactions   Lactose Intolerance (gi)    bloating   Tramadol Nausea Only        Medication List        Accurate as of November 05, 2023 11:06 AM. If you have any questions, ask your nurse or doctor.          Accu-Chek Aviva Plus w/Device Kit Test qd. DX E11.9   Accu-Chek Softclix Lancets lancets Test once qd. DX E11.9   acetaminophen  500 MG tablet Commonly known as: TYLENOL  Take 1,000 mg by mouth every 6 (six) hours as needed for moderate pain. Pain   ALPRAZolam  1 MG tablet Commonly known as: XANAX  TAKE (1) TABLET BY MOUTH THREE TIMES DAILY.   amLODipine  2.5 MG tablet Commonly known as: NORVASC  Take 1 tablet (2.5 mg total) by mouth daily.   aspirin EC 81 MG tablet Take 81 mg by mouth daily.   atorvastatin  40 MG tablet Commonly known as: LIPITOR Take 1 tablet (40 mg total) by mouth daily.   buPROPion  300 MG 24 hr tablet Commonly known as: WELLBUTRIN  XL TAKE 1 TABLET BY MOUTH ONCE EVERY MORNING.   carvedilol  12.5 MG tablet Commonly known as: COREG  TAKE (  1) TABLET BY MOUTH TWICE DAILY.   cefadroxil  500 MG capsule Commonly known as: DURICEF Take 1 capsule (500 mg total) by mouth 2 (two) times daily.   dapagliflozin  propanediol 10 MG Tabs tablet Commonly known as: Farxiga  Take 1 tablet (10 mg total) by mouth daily before breakfast.   esomeprazole  20 MG capsule Commonly known as: NEXIUM  TAKE (1) CAPSULE BY MOUTH TWICE DAILYBEFORE A MEAL   glucose blood test strip Commonly known as: Accu-Chek Aviva  Plus Test once qd. DX E11.9   magnesium  30 MG tablet Take 30 mg by mouth 2 (two) times daily.   metFORMIN  500 MG tablet Commonly known as: GLUCOPHAGE  Take 1 tablet (500 mg total) by mouth 2 (two) times daily with a meal.   mupirocin  ointment 2 % Commonly known as: BACTROBAN  Apply 1 Application topically 2 (two) times daily.   mycophenolate  500 MG tablet Commonly known as: CELLCEPT  Take 500-1,000 mg by mouth See admin instructions. Take 500 mg by mouth in the morning and 1000 mg at night   Omega-3 1000 MG Caps Take 1,000 mg by mouth in the morning and at bedtime.   PEN NEEDLES 31GX5/16 31G X 8 MM Misc 1 each by Does not apply route daily.   PROBIOTIC DAILY PO Take 1 capsule by mouth daily.   ramipril  2.5 MG capsule Commonly known as: ALTACE  Take 2.5 mg by mouth daily.   sildenafil  20 MG tablet Commonly known as: REVATIO  TAKE 1-5 TABLETS BY MOUTH AS NEEDED   sitaGLIPtin  50 MG tablet Commonly known as: Januvia  Take 1 tablet (50 mg total) by mouth daily.   tacrolimus  0.5 MG capsule Commonly known as: PROGRAF  Take 0.5 mg by mouth 2 (two) times daily.   Vilazodone  HCl 10 MG Tabs Commonly known as: VIIBRYD  Take 1 tablet (10 mg total) by mouth daily.         Objective:   BP (!) 108/55   Pulse 91   Temp (!) 97.4 F (36.3 C)   Ht 5' 7 (1.702 m)   Wt 136 lb (61.7 kg)   SpO2 100%   BMI 21.30 kg/m   Wt Readings from Last 3 Encounters:  11/05/23 136 lb (61.7 kg)  09/30/23 137 lb (62.1 kg)  08/05/23 131 lb (59.4 kg)    Physical Exam Vitals and nursing note reviewed.  Constitutional:      General: He is not in acute distress.    Appearance: Normal appearance. He is normal weight.  HENT:     Head: Normocephalic and atraumatic.     Right Ear: External ear normal.     Left Ear: External ear normal.  Eyes:     Conjunctiva/sclera: Conjunctivae normal.  Cardiovascular:     Rate and Rhythm: Normal rate and regular rhythm.     Heart sounds:     Gallop  present.  Pulmonary:     Effort: Pulmonary effort is normal.     Breath sounds: Normal breath sounds. No wheezing or rales.  Abdominal:     General: Abdomen is flat. There is no distension.     Palpations: Abdomen is soft.     Tenderness: There is no abdominal tenderness. There is no right CVA tenderness or left CVA tenderness.  Musculoskeletal:     Cervical back: Normal range of motion and neck supple. No tenderness.     Right lower leg: No edema.     Left lower leg: No edema.  Skin:    General: Skin is warm and dry.  Neurological:     Mental Status: He is alert and oriented to person, place, and time.  Psychiatric:        Attention and Perception: Attention and perception normal.        Mood and Affect: Affect normal. Mood is anxious.        Speech: Speech normal.        Behavior: Behavior normal. Behavior is cooperative.        Thought Content: Thought content normal.        Judgment: Judgment normal.    Assessment & Plan:   Problem List Items Addressed This Visit       Cardiovascular and Mediastinum   Hypertension associated with diabetes (HCC)   Relevant Medications   dapagliflozin  propanediol (FARXIGA ) 10 MG TABS tablet     Digestive   GERD (gastroesophageal reflux disease)   Relevant Medications   esomeprazole  (NEXIUM ) 20 MG capsule     Endocrine   Diabetes type 2, controlled (HCC)   Relevant Medications   dapagliflozin  propanediol (FARXIGA ) 10 MG TABS tablet     Genitourinary   CKD (chronic kidney disease), stage III (HCC)     Other   Hypertriglyceridemia   Other Visit Diagnoses       Type 2 diabetes mellitus with stage 3b chronic kidney disease, without long-term current use of insulin  (HCC)    -  Primary   Relevant Medications   dapagliflozin  propanediol (FARXIGA ) 10 MG TABS tablet   Other Relevant Orders   CBC with Differential/Platelet (Completed)   CMP14+EGFR (Completed)   Lipid panel (Completed)   Bayer DCA Hb A1c Waived (Completed)    Microalbumin / creatinine urine ratio       Patient is doing well with no major health concerns. Blood pressure is well-controlled at 108/55. Encouraged patient to notify us  if episodes of lightheadedness become more frequent or if his blood pressure numbers are low with home monitor. Patient is tolerating atorvastatin  well. Will recheck lipid panel today. HgbA1c 7.1%. Recommended patient continue with diet and exercise modifications. Will recheck CBC today for anemia.  Follow up plan: Return in about 3 months (around 02/03/2024), or if symptoms worsen or fail to improve, for dma and htn.  Counseling provided for all of the vaccine components Orders Placed This Encounter  Procedures   CBC with Differential/Platelet   CMP14+EGFR   Lipid panel   Bayer DCA Hb A1c Waived   Microalbumin / creatinine urine ratio    Anthony Price, Medical Student [facility] 11/05/2023, 11:06 AM  Patient seen and examined with Anthony Price, medical student, agree with assessment and plan above.  A1c looks decent.  Continue to focus on diet.  Patient has stage IIIb kidney disease Anthony Levins, MD [facility] 11/06/2023, 8:12 AM

## 2023-11-06 LAB — CMP14+EGFR
ALT: 8 [IU]/L (ref 0–44)
AST: 12 [IU]/L (ref 0–40)
Albumin: 4.6 g/dL (ref 3.9–4.9)
Alkaline Phosphatase: 62 [IU]/L (ref 44–121)
BUN/Creatinine Ratio: 13 (ref 10–24)
BUN: 23 mg/dL (ref 8–27)
Bilirubin Total: 0.4 mg/dL (ref 0.0–1.2)
CO2: 19 mmol/L — ABNORMAL LOW (ref 20–29)
Calcium: 9 mg/dL (ref 8.6–10.2)
Chloride: 103 mmol/L (ref 96–106)
Creatinine, Ser: 1.83 mg/dL — ABNORMAL HIGH (ref 0.76–1.27)
Globulin, Total: 2.5 g/dL (ref 1.5–4.5)
Glucose: 256 mg/dL — ABNORMAL HIGH (ref 70–99)
Potassium: 4.8 mmol/L (ref 3.5–5.2)
Sodium: 140 mmol/L (ref 134–144)
Total Protein: 7.1 g/dL (ref 6.0–8.5)
eGFR: 40 mL/min/{1.73_m2} — ABNORMAL LOW (ref >59)

## 2023-11-06 LAB — CBC WITH DIFFERENTIAL/PLATELET
Basophils Absolute: 0.1 10*3/uL (ref 0.0–0.2)
Basos: 1 %
EOS (ABSOLUTE): 0.1 10*3/uL (ref 0.0–0.4)
Eos: 1 %
Hematocrit: 39.3 % (ref 37.5–51.0)
Hemoglobin: 11.5 g/dL — ABNORMAL LOW (ref 13.0–17.7)
Immature Grans (Abs): 0 10*3/uL (ref 0.0–0.1)
Immature Granulocytes: 0 %
Lymphocytes Absolute: 2 10*3/uL (ref 0.7–3.1)
Lymphs: 30 %
MCH: 22.4 pg — ABNORMAL LOW (ref 26.6–33.0)
MCHC: 29.3 g/dL — ABNORMAL LOW (ref 31.5–35.7)
MCV: 77 fL — ABNORMAL LOW (ref 79–97)
Monocytes Absolute: 0.6 10*3/uL (ref 0.1–0.9)
Monocytes: 9 %
Neutrophils Absolute: 3.9 10*3/uL (ref 1.4–7.0)
Neutrophils: 59 %
Platelets: 157 10*3/uL (ref 150–450)
RBC: 5.13 x10E6/uL (ref 4.14–5.80)
RDW: 14.6 % (ref 11.6–15.4)
WBC: 6.6 10*3/uL (ref 3.4–10.8)

## 2023-11-06 LAB — LIPID PANEL
Chol/HDL Ratio: 4.9 {ratio} (ref 0.0–5.0)
Cholesterol, Total: 122 mg/dL (ref 100–199)
HDL: 25 mg/dL — ABNORMAL LOW (ref >39)
LDL Chol Calc (NIH): 46 mg/dL (ref 0–99)
Triglycerides: 333 mg/dL — ABNORMAL HIGH (ref 0–149)
VLDL Cholesterol Cal: 51 mg/dL — ABNORMAL HIGH (ref 5–40)

## 2023-11-17 ENCOUNTER — Other Ambulatory Visit: Payer: Self-pay

## 2023-11-17 MED ORDER — FENOFIBRATE 48 MG PO TABS: 48.0000 mg | ORAL_TABLET | Freq: Every day | ORAL | 1 refills | Status: DC

## 2023-12-17 DIAGNOSIS — Z941 Heart transplant status: Secondary | ICD-10-CM | POA: Diagnosis not present

## 2023-12-17 DIAGNOSIS — R7989 Other specified abnormal findings of blood chemistry: Secondary | ICD-10-CM | POA: Diagnosis not present

## 2023-12-17 DIAGNOSIS — Z79899 Other long term (current) drug therapy: Secondary | ICD-10-CM | POA: Diagnosis not present

## 2023-12-17 DIAGNOSIS — Z48298 Encounter for aftercare following other organ transplant: Secondary | ICD-10-CM | POA: Diagnosis not present

## 2024-01-05 ENCOUNTER — Telehealth: Payer: Self-pay | Admitting: Family Medicine

## 2024-01-05 DIAGNOSIS — N1832 Chronic kidney disease, stage 3b: Secondary | ICD-10-CM

## 2024-01-05 NOTE — Telephone Encounter (Unsigned)
 Copied from CRM [id number]. Topic: Clinical - Prescription Issue >> Jan 05, 2024 11:19 AM [name] wrote: Reason for CRM: The patient is requesting that his metFORMIN (GLUCOPHAGE) 500 MG tablet be changed to a 90 day supply.

## 2024-01-06 MED ORDER — METFORMIN HCL 500 MG PO TABS
500.0000 mg | ORAL_TABLET | Freq: Two times a day (BID) | ORAL | 3 refills | Status: AC
Start: 2024-01-06 — End: ?

## 2024-01-06 NOTE — Telephone Encounter (Signed)
 90 day supply with 1 refill sent to [facility]

## 2024-01-30 ENCOUNTER — Emergency Department (HOSPITAL_COMMUNITY)

## 2024-01-30 ENCOUNTER — Other Ambulatory Visit: Payer: Self-pay

## 2024-01-30 ENCOUNTER — Emergency Department (HOSPITAL_COMMUNITY)
Admission: EM | Admit: 2024-01-30 | Discharge: 2024-01-31 | Disposition: A | Discharge status: Home / Self Care | Attending: Emergency Medicine | Admitting: Emergency Medicine

## 2024-01-30 ENCOUNTER — Encounter (HOSPITAL_COMMUNITY): Payer: Self-pay | Admitting: Emergency Medicine

## 2024-01-30 DIAGNOSIS — Z79899 Other long term (current) drug therapy: Secondary | ICD-10-CM | POA: Insufficient documentation

## 2024-01-30 DIAGNOSIS — E1165 Type 2 diabetes mellitus with hyperglycemia: Secondary | ICD-10-CM | POA: Diagnosis not present

## 2024-01-30 DIAGNOSIS — Z794 Long term (current) use of insulin: Secondary | ICD-10-CM | POA: Diagnosis not present

## 2024-01-30 DIAGNOSIS — R1030 Lower abdominal pain, unspecified: Secondary | ICD-10-CM | POA: Diagnosis not present

## 2024-01-30 DIAGNOSIS — R109 Unspecified abdominal pain: Secondary | ICD-10-CM | POA: Diagnosis not present

## 2024-01-30 DIAGNOSIS — I129 Hypertensive chronic kidney disease with stage 1 through stage 4 chronic kidney disease, or unspecified chronic kidney disease: Secondary | ICD-10-CM | POA: Diagnosis not present

## 2024-01-30 DIAGNOSIS — N179 Acute kidney failure, unspecified: Secondary | ICD-10-CM | POA: Insufficient documentation

## 2024-01-30 DIAGNOSIS — I10 Essential (primary) hypertension: Secondary | ICD-10-CM

## 2024-01-30 DIAGNOSIS — Z7984 Long term (current) use of oral hypoglycemic drugs: Secondary | ICD-10-CM | POA: Diagnosis not present

## 2024-01-30 DIAGNOSIS — N1831 Chronic kidney disease, stage 3a: Secondary | ICD-10-CM | POA: Insufficient documentation

## 2024-01-30 DIAGNOSIS — N189 Chronic kidney disease, unspecified: Secondary | ICD-10-CM | POA: Diagnosis not present

## 2024-01-30 DIAGNOSIS — R1909 Other intra-abdominal and pelvic swelling, mass and lump: Secondary | ICD-10-CM | POA: Insufficient documentation

## 2024-01-30 DIAGNOSIS — R030 Elevated blood-pressure reading, without diagnosis of hypertension: Secondary | ICD-10-CM

## 2024-01-30 DIAGNOSIS — Z7982 Long term (current) use of aspirin: Secondary | ICD-10-CM | POA: Insufficient documentation

## 2024-01-30 DIAGNOSIS — M7989 Other specified soft tissue disorders: Secondary | ICD-10-CM

## 2024-01-30 DIAGNOSIS — E860 Dehydration: Secondary | ICD-10-CM | POA: Insufficient documentation

## 2024-01-30 DIAGNOSIS — R079 Chest pain, unspecified: Secondary | ICD-10-CM | POA: Diagnosis not present

## 2024-01-30 DIAGNOSIS — R739 Hyperglycemia, unspecified: Secondary | ICD-10-CM

## 2024-01-30 LAB — CBC
HCT: 38.4 % — ABNORMAL LOW (ref 39.0–52.0)
Hemoglobin: 11.4 g/dL — ABNORMAL LOW (ref 13.0–17.0)
MCH: 22.4 pg — ABNORMAL LOW (ref 26.0–34.0)
MCHC: 29.7 g/dL — ABNORMAL LOW (ref 30.0–36.0)
MCV: 75.3 fL — ABNORMAL LOW (ref 80.0–100.0)
Platelets: 166 10*3/uL (ref 150–400)
RBC: 5.1 MIL/uL (ref 4.22–5.81)
RDW: 16.1 % — ABNORMAL HIGH (ref 11.5–15.5)
WBC: 10.6 10*3/uL — ABNORMAL HIGH (ref 4.0–10.5)
nRBC: 0 % (ref 0.0–0.2)

## 2024-01-30 LAB — I-STAT CHEM 8, ED
BUN: 24 mg/dL — ABNORMAL HIGH (ref 8–23)
Calcium, Ion: 1.19 mmol/L (ref 1.15–1.40)
Chloride: 104 mmol/L (ref 98–111)
Creatinine, Ser: 2.2 mg/dL — ABNORMAL HIGH (ref 0.61–1.24)
Glucose, Bld: 335 mg/dL — ABNORMAL HIGH (ref 70–99)
HCT: 40 % (ref 39.0–52.0)
Hemoglobin: 13.6 g/dL (ref 13.0–17.0)
Potassium: 4.3 mmol/L (ref 3.5–5.1)
Sodium: 136 mmol/L (ref 135–145)
TCO2: 21 mmol/L — ABNORMAL LOW (ref 22–32)

## 2024-01-30 LAB — URINALYSIS, ROUTINE W REFLEX MICROSCOPIC
Bacteria, UA: NONE SEEN
Bilirubin Urine: NEGATIVE
Glucose, UA: >=500 mg/dL — AB
Hgb urine dipstick: NEGATIVE
Ketones, ur: NEGATIVE mg/dL
Leukocytes,Ua: NEGATIVE
Nitrite: NEGATIVE
Protein, ur: 30 mg/dL — AB
Specific Gravity, Urine: 1.02 (ref 1.005–1.030)
pH: 6 (ref 5.0–8.0)

## 2024-01-30 LAB — BASIC METABOLIC PANEL WITH GFR
Anion gap: 16 — ABNORMAL HIGH (ref 5–15)
BUN: 22 mg/dL (ref 8–23)
CO2: 17 mmol/L — ABNORMAL LOW (ref 22–32)
Calcium: 9.8 mg/dL (ref 8.9–10.3)
Chloride: 100 mmol/L (ref 98–111)
Creatinine, Ser: 2.04 mg/dL — ABNORMAL HIGH (ref 0.61–1.24)
GFR, Estimated: 36 mL/min — ABNORMAL LOW (ref >60)
Glucose, Bld: 343 mg/dL — ABNORMAL HIGH (ref 70–99)
Potassium: 3.9 mmol/L (ref 3.5–5.1)
Sodium: 133 mmol/L — ABNORMAL LOW (ref 135–145)

## 2024-01-30 LAB — CBG MONITORING, ED: Glucose-Capillary: 238 mg/dL — ABNORMAL HIGH (ref 70–99)

## 2024-01-30 LAB — TROPONIN I (HIGH SENSITIVITY)
Troponin I (High Sensitivity): 5 ng/L (ref <18)
Troponin I (High Sensitivity): 5 ng/L (ref <18)

## 2024-01-30 LAB — HEPATIC FUNCTION PANEL
ALT: 11 U/L (ref 0–44)
AST: 17 U/L (ref 15–41)
Albumin: 4.3 g/dL (ref 3.5–5.0)
Alkaline Phosphatase: 42 U/L (ref 38–126)
Bilirubin, Direct: 0.1 mg/dL (ref 0.0–0.2)
Indirect Bilirubin: 0.8 mg/dL (ref 0.3–0.9)
Total Bilirubin: 0.9 mg/dL (ref 0.0–1.2)
Total Protein: 7.6 g/dL (ref 6.5–8.1)

## 2024-01-30 LAB — LIPASE, BLOOD: Lipase: 67 U/L — ABNORMAL HIGH (ref 11–51)

## 2024-01-30 MED ORDER — POTASSIUM CHLORIDE 10 MEQ/100ML IV SOLN
10.0000 meq | INTRAVENOUS | Status: DC
  Filled 2024-01-30: qty 100

## 2024-01-30 MED ORDER — INSULIN REGULAR(HUMAN) IN NACL 100-0.9 UT/100ML-% IV SOLN: INTRAVENOUS | Status: DC

## 2024-01-30 MED ORDER — LACTATED RINGERS IV SOLN
INTRAVENOUS | Status: DC
Start: 2024-01-30 — End: 2024-01-31

## 2024-01-30 MED ORDER — MORPHINE SULFATE (PF) 2 MG/ML IV SOLN
2.0000 mg | Freq: Once | INTRAVENOUS | Status: AC
  Administered 2024-01-30: 2 mg via INTRAVENOUS
  Filled 2024-01-30: qty 1

## 2024-01-30 MED ORDER — DIPHENHYDRAMINE HCL 50 MG/ML IJ SOLN: 25.0000 mg | Freq: Once | INTRAMUSCULAR | Status: DC

## 2024-01-30 MED ORDER — INSULIN ASPART 100 UNIT/ML IJ SOLN
8.0000 [IU] | Freq: Once | INTRAMUSCULAR | Status: AC
  Administered 2024-01-30: 8 [IU] via SUBCUTANEOUS
  Filled 2024-01-30: qty 1

## 2024-01-30 MED ORDER — DEXTROSE 50 % IV SOLN: 0.0000 mL | INTRAVENOUS | Status: DC | PRN

## 2024-01-30 MED ORDER — INSULIN ASPART 100 UNIT/ML IJ SOLN: 8.0000 [IU] | Freq: Once | INTRAMUSCULAR | Status: DC

## 2024-01-30 MED ORDER — ONDANSETRON HCL 4 MG/2ML IJ SOLN
4.0000 mg | Freq: Once | INTRAMUSCULAR | Status: AC
  Administered 2024-01-30: 4 mg via INTRAVENOUS
  Filled 2024-01-30: qty 2

## 2024-01-30 MED ORDER — LACTATED RINGERS IV BOLUS
1000.0000 mL | Freq: Once | INTRAVENOUS | Status: AC
  Administered 2024-01-30: 1000 mL via INTRAVENOUS

## 2024-01-30 MED ORDER — DEXTROSE IN LACTATED RINGERS 5 % IV SOLN
INTRAVENOUS | Status: DC
Start: 2024-01-30 — End: 2024-01-31

## 2024-01-30 NOTE — ED Triage Notes (Signed)
 Pt woke up this morning with bilateral lower back pain. Noticed this afternoon, he started having left lower abdominal pain, now generalized pain. Denies hematuria, n/v/d or constipation. Pt reports hx of heart transplant. Increased pressure in his chest.

## 2024-01-30 NOTE — ED Provider Notes (Addendum)
 [facility] AT [facility] Provider Note   CSN: [id number] Arrival date & time: 01/30/24  2050     History  Chief Complaint  Patient presents with   Abdominal Pain    Anthony Price is a 68 y.o. male.  Patient with c/o lower abdominal/flank pain since this AM. Pain dull, constant, non radiating, waxing and waning, without specific exacerbating or alleviating factors. No hx same pain. No scrotal or testicular pain. No dysuria or hematuria. Having normal bms. No abd distension. No vomiting. No fever or chills. Denies chest pain or discomfort. No sob. No cough or uri symptoms.   The history is provided by the patient and medical records.  Abdominal Pain Associated symptoms: no chest pain, no chills, no cough, no diarrhea, no dysuria, no fever, no hematuria, no shortness of breath, no sore throat and no vomiting        Home Medications Prior to Admission medications   Medication Sig Start Date End Date Taking? Authorizing Provider  ACCU-CHEK SOFTCLIX LANCETS lancets Test once qd. DX E11.9 06/19/16   Dettinger, Elige Radon, MD  acetaminophen (TYLENOL) 500 MG tablet Take 1,000 mg by mouth every 6 (six) hours as needed for moderate pain. Pain    [provider]  ALPRAZolam (XANAX) 1 MG tablet TAKE (1) TABLET BY MOUTH THREE TIMES DAILY. 11/02/23   Myrlene Broker, MD  amLODipine (NORVASC) 2.5 MG tablet Take 1 tablet (2.5 mg total) by mouth daily. 05/01/23   Dettinger, Elige Radon, MD  aspirin EC 81 MG tablet Take 81 mg by mouth daily.    [provider]  atorvastatin (LIPITOR) 40 MG tablet Take 1 tablet (40 mg total) by mouth daily. 08/25/23   Dettinger, Elige Radon, MD  Blood Glucose Monitoring Suppl (ACCU-CHEK AVIVA PLUS) w/Device KIT Test qd. DX E11.9 07/04/20   Dettinger, Elige Radon, MD  buPROPion (WELLBUTRIN XL) 300 MG 24 hr tablet TAKE 1 TABLET BY MOUTH ONCE EVERY MORNING. 11/02/23   Myrlene Broker, MD  carvedilol (COREG) 12.5 MG tablet TAKE (1)  TABLET BY MOUTH TWICE DAILY. 08/25/23   Dettinger, Elige Radon, MD  cefadroxil (DURICEF) 500 MG capsule Take 1 capsule (500 mg total) by mouth 2 (two) times daily. 06/27/23   Peter Garter, PA  dapagliflozin propanediol (FARXIGA) 10 MG TABS tablet Take 1 tablet (10 mg total) by mouth daily before breakfast. 11/05/23   Dettinger, Elige Radon, MD  esomeprazole (NEXIUM) 20 MG capsule TAKE (1) CAPSULE BY MOUTH TWICE DAILYBEFORE A MEAL 11/05/23   Dettinger, Elige Radon, MD  fenofibrate (TRICOR) 48 MG tablet Take 1 tablet (48 mg total) by mouth daily. 11/17/23   Dettinger, Elige Radon, MD  glucose blood (ACCU-CHEK AVIVA PLUS) test strip Test once qd. DX E11.9 06/19/16   Dettinger, Elige Radon, MD  Insulin Pen Needle (PEN NEEDLES 31GX5/16") 31G X 8 MM MISC 1 each by Does not apply route daily. 04/16/22   Dettinger, Elige Radon, MD  magnesium 30 MG tablet Take 30 mg by mouth 2 (two) times daily.    [provider]  metFORMIN (GLUCOPHAGE) 500 MG tablet Take 1 tablet (500 mg total) by mouth 2 (two) times daily with a meal. 01/06/24   Dettinger, Elige Radon, MD  mupirocin ointment (BACTROBAN) 2 % Apply 1 Application topically 2 (two) times daily. 06/27/23   Peter Garter, PA  mycophenolate (CELLCEPT) 500 MG tablet Take 500-1,000 mg by mouth See admin instructions. Take 500 mg by mouth in the  morning and 1000 mg at night    [provider]  Omega-3 1000 MG CAPS Take 1,000 mg by mouth in the morning and at bedtime.    [provider]  Probiotic Product (PROBIOTIC DAILY PO) Take 1 capsule by mouth daily.     [provider]  ramipril (ALTACE) 2.5 MG capsule Take 2.5 mg by mouth daily.    [provider]  sildenafil (REVATIO) 20 MG tablet TAKE 1-5 TABLETS BY MOUTH AS NEEDED 09/28/23   Dettinger, Elige Radon, MD  sitaGLIPtin (JANUVIA) 50 MG tablet Take 1 tablet (50 mg total) by mouth daily. 08/05/23   Dettinger, Elige Radon, MD  tacrolimus (PROGRAF) 0.5 MG capsule Take 0.5 mg by mouth 2 (two) times  daily. 11/30/20   [provider]  Vilazodone HCl (VIIBRYD) 10 MG TABS Take 1 tablet (10 mg total) by mouth daily. 11/02/23   Myrlene Broker, MD      Allergies    Lactose intolerance (gi) and Tramadol    Review of Systems   Review of Systems  Constitutional:  Negative for chills and fever.  HENT:  Negative for sore throat.   Respiratory:  Negative for cough and shortness of breath.   Cardiovascular:  Negative for chest pain.  Gastrointestinal:  Positive for abdominal pain. Negative for diarrhea and vomiting.  Genitourinary:  Negative for dysuria, flank pain, hematuria and testicular pain.  Musculoskeletal:  Negative for neck pain.  Skin:  Negative for rash.  Neurological:  Negative for headaches.    Physical Exam Updated Vital Signs BP (!) 161/95   Pulse (!) 105   Temp 98.8 F (37.1 C) (Oral)   Resp 20   SpO2 100%  Physical Exam Vitals and nursing note reviewed.  Constitutional:      Appearance: Normal appearance. He is well-developed.  HENT:     Head: Atraumatic.     Nose: Nose normal.     Mouth/Throat:     Mouth: Mucous membranes are moist.     Pharynx: Oropharynx is clear.  Eyes:     General: No scleral icterus.    Conjunctiva/sclera: Conjunctivae normal.  Neck:     Trachea: No tracheal deviation.     Comments: Trachea midline. Thyroid not grossly enlarged or tender.  Cardiovascular:     Rate and Rhythm: Regular rhythm. Tachycardia present.     Pulses: Normal pulses.     Heart sounds: Normal heart sounds. No murmur heard.    No friction rub. No gallop.  Pulmonary:     Effort: Pulmonary effort is normal. No accessory muscle usage or respiratory distress.     Breath sounds: Normal breath sounds.  Abdominal:     General: Bowel sounds are normal. There is no distension.     Palpations: Abdomen is soft. There is no mass.     Tenderness: There is abdominal tenderness. There is no guarding or rebound.     Hernia: No hernia is present.     Comments: Mid to  lower abd tenderness. No peritoneal signs. No incarcerated hernia.   Genitourinary:    Comments: No cva tenderness. Normal external gu exam, no scrotal or testicular pain, swelling or tenderness.  Musculoskeletal:        General: No swelling.     Cervical back: Normal range of motion and neck supple. No rigidity.  Skin:    General: Skin is warm and dry.     Findings: No rash.  Neurological:     Mental Status: He  is alert.     Comments: Alert, speech clear.   Psychiatric:        Mood and Affect: Mood normal.     ED Results / Procedures / Treatments   Labs (all labs ordered are listed, but only abnormal results are displayed) Results for orders placed or performed during the hospital encounter of 01/30/24  CBC   Collection Time: 01/30/24  9:13 PM  Result Value Ref Range   WBC 10.6 (H) 4.0 - 10.5 K/uL   RBC 5.10 4.22 - 5.81 MIL/uL   Hemoglobin 11.4 (L) 13.0 - 17.0 g/dL   HCT 57.8 (L) 46.9 - 62.9 %   MCV 75.3 (L) 80.0 - 100.0 fL   MCH 22.4 (L) 26.0 - 34.0 pg   MCHC 29.7 (L) 30.0 - 36.0 g/dL   RDW 52.8 (H) 41.3 - 24.4 %   Platelets 166 150 - 400 K/uL   nRBC 0.0 0.0 - 0.2 %  Lipase, blood   Collection Time: 01/30/24  9:13 PM  Result Value Ref Range   Lipase 67 (H) 11 - 51 U/L  Hepatic function panel   Collection Time: 01/30/24  9:13 PM  Result Value Ref Range   Total Protein 7.6 6.5 - 8.1 g/dL   Albumin 4.3 3.5 - 5.0 g/dL   AST 17 15 - 41 U/L   ALT 11 0 - 44 U/L   Alkaline Phosphatase 42 38 - 126 U/L   Total Bilirubin 0.9 0.0 - 1.2 mg/dL   Bilirubin, Direct 0.1 0.0 - 0.2 mg/dL   Indirect Bilirubin 0.8 0.3 - 0.9 mg/dL  Basic metabolic panel with GFR   Collection Time: 01/30/24  9:13 PM  Result Value Ref Range   Sodium 133 (L) 135 - 145 mmol/L   Potassium 3.9 3.5 - 5.1 mmol/L   Chloride 100 98 - 111 mmol/L   CO2 17 (L) 22 - 32 mmol/L   Glucose, Bld 343 (H) 70 - 99 mg/dL   BUN 22 8 - 23 mg/dL   Creatinine, Ser 0.10 (H) 0.61 - 1.24 mg/dL   Calcium 9.8 8.9 - 27.2 mg/dL    GFR, Estimated 36 (L) >60 mL/min   Anion gap 16 (H) 5 - 15  Troponin I (High Sensitivity)   Collection Time: 01/30/24  9:13 PM  Result Value Ref Range   Troponin I (High Sensitivity) 5 <18 ng/L  I-stat chem 8, ED   Collection Time: 01/30/24  9:21 PM  Result Value Ref Range   Sodium 136 135 - 145 mmol/L   Potassium 4.3 3.5 - 5.1 mmol/L   Chloride 104 98 - 111 mmol/L   BUN 24 (H) 8 - 23 mg/dL   Creatinine, Ser 5.36 (H) 0.61 - 1.24 mg/dL   Glucose, Bld 644 (H) 70 - 99 mg/dL   Calcium, Ion 0.34 7.42 - 1.40 mmol/L   TCO2 21 (L) 22 - 32 mmol/L   Hemoglobin 13.6 13.0 - 17.0 g/dL   HCT 59.5 63.8 - 75.6 %  Troponin I (High Sensitivity)   Collection Time: 01/30/24 10:38 PM  Result Value Ref Range   Troponin I (High Sensitivity) 5 <18 ng/L      EKG EKG Interpretation Date/Time:  Saturday January 30 2024 21:03:23 EDT Ventricular Rate:  115 PR Interval:  148 QRS Duration:  128 QT Interval:  343 QTC Calculation: 475 R Axis:   61  Text Interpretation: Sinus tachycardia Right bundle branch block Non-specific ST-t changes Confirmed by Cathren Laine ([id number]) on 01/30/2024 9:08:44 PM  Radiology CT ABDOMEN PELVIS WO CONTRAST Result Date: 01/30/2024 CLINICAL DATA:  Acute nonlocalized abdominal pain, history of cardiac transplantation EXAM: CT ABDOMEN AND PELVIS WITHOUT CONTRAST TECHNIQUE: Multidetector CT imaging of the abdomen and pelvis was performed following the standard protocol without IV contrast. RADIATION DOSE REDUCTION: This exam was performed according to the departmental dose-optimization program which includes automated exposure control, adjustment of the mA and/or kV according to patient size and/or use of iterative reconstruction technique. COMPARISON:  None Available. FINDINGS: Lower chest: Surgical changes of cardiac transplantation noted. No acute abnormality. Hepatobiliary: No focal liver abnormality is seen. No gallstones, gallbladder wall thickening, or biliary dilatation.  Pancreas: Unremarkable Spleen: Unremarkable Adrenals/Urinary Tract: Adrenal glands are unremarkable. Kidneys are normal, without renal calculi, focal lesion, or hydronephrosis. Bladder is unremarkable. Stomach/Bowel: Stomach is within normal limits. Appendix appears normal. No evidence of bowel wall thickening, distention, or inflammatory changes. Vascular/Lymphatic: A rounded soft tissue mass is seen within the left hemipelvis confluent with the adjacent iliac vasculature. While this is favored to represent a pathologically enlarged lymph node, a vascular aneurysm is not definitively excluded on this noncontrast examination. This measures 3.3 x 2.9 cm at axial image # 64/2. Extensive aortoiliac atherosclerotic calcification. No additional pathologic adenopathy identified within the abdomen and pelvis. Reproductive: Prostate is unremarkable. Other: No abdominal wall hernia or abnormality. No abdominopelvic ascites. Musculoskeletal: No acute or significant osseous findings. IMPRESSION: 1. No acute intra-abdominal pathology identified. 2. 3.3 cm rounded soft tissue mass within the left hemipelvis confluent with the adjacent iliac vasculature. While this is favored to represent a pathologically enlarged lymph node, a vascular aneurysm is not definitively excluded on this noncontrast examination. This could be confirmed with contrast enhanced CT examination or dedicated Doppler sonography. 3. Extensive aortoiliac atherosclerotic calcification. Aortic Atherosclerosis (ICD10-I70.0). Electronically Signed   By: Helyn Numbers M.D.   On: 01/30/2024 22:16   DG Chest 2 View Result Date: 01/30/2024 CLINICAL DATA:  Generalized pain. EXAM: CHEST - 2 VIEW COMPARISON:  None Available. FINDINGS: Multiple sternal wires are noted. The heart size and mediastinal contours are within normal limits. Radiopaque surgical clips are seen along the superior mediastinum. There is marked severity calcification of the aortic arch. Both lungs  are clear. Multilevel degenerative changes are seen throughout the thoracic spine. IMPRESSION: No active cardiopulmonary disease. Electronically Signed   By: Aram Candela M.D.   On: 01/30/2024 21:49    Procedures Procedures    Medications Ordered in ED Medications  diphenhydrAMINE (BENADRYL) injection 25 mg (0 mg Intravenous Hold 01/30/24 2259)  insulin aspart (novoLOG) injection 8 Units (has no administration in time range)  lactated ringers bolus 1,000 mL (1,000 mLs Intravenous New Bag/Given 01/30/24 2241)  morphine (PF) 2 MG/ML injection 2 mg (2 mg Intravenous Given 01/30/24 2241)  ondansetron (ZOFRAN) injection 4 mg (4 mg Intravenous Given 01/30/24 2241)    ED Course/ Medical Decision Making/ A&P                                 Medical Decision Making Problems Addressed: AKI (acute kidney injury) [facility]): acute illness or injury Dehydration: acute illness or injury with systemic symptoms that poses a threat to life or bodily functions Elevated blood pressure reading: acute illness or injury Essential hypertension: chronic illness or injury that poses a threat to life or bodily functions Hyperglycemia: acute illness or injury Lower abdominal pain: acute illness or injury Mass of soft tissue of pelvis:  undiagnosed new problem with uncertain prognosis Stage 3a chronic kidney disease (HCC): chronic illness or injury  Amount and/or Complexity of Data Reviewed Independent Historian: spouse    Details: hx External Data Reviewed: notes. Labs: ordered. Decision-making details documented in ED Course. Radiology: ordered and independent interpretation performed. Decision-making details documented in ED Course.  Risk Prescription drug management. Parenteral controlled substances. Decision regarding hospitalization.   Iv ns. Continuous pulse ox and cardiac monitoring. Labs ordered/sent. Imaging ordered.   Differential diagnosis includes appendicitis, diverticulitis, ureteral stone,  etc. Dispo decision including potential need for admission considered - will get labs and imaging and reassess.   Reviewed nursing notes and prior charts for additional history. External reports reviewed. Additional history from: spouse.   Cardiac monitor: sinus rhythm, rate 115.  Labs reviewed/interpreted by me - wbc 10, hgb 11. Glucose 335, hx hyperglycemia/diabetes. Hco3 on istat ok.  IVF bolus. Po fluids. Lfts normal. Hx ckd, cr sl increased this evening. Ivf. Trop normal.   CT reviewed/interpreted by me - no sbo. No diverticulitis or acute infection. 3 cm soft tissue mass on imaging shared w pt and need for pcp f/u/outpatient imaging.   2300, additional labs, ivf, etc, pending.  2315, additional labs reviewed/interpreted by me - glucose is high, hco3 is low 17, ag 16, mildly elevated, ?mild dka vs ckd/dehydration.   Insulin gtt per hyperglycemic crisis orders. Ivf.   Hospitalists consulted for admission. Patient indicates does not want to stay for admission.  Discussed labs, vitals, etc, and reasons for admission - pt indicates does not want to be admitted.  Will give ivf, subcut insulin, check serum ketones, recheck bmet.   Signed out to oncoming EDP to check post fluids/insulin and checking pending labs.         Final Clinical Impression(s) / ED Diagnoses Final diagnoses:  Lower abdominal pain  Hyperglycemia  Dehydration  Stage 3a chronic kidney disease (HCC)  AKI (acute kidney injury) (HCC)  Elevated blood pressure reading  Essential hypertension  Mass of soft tissue of pelvis    Rx / DC Orders ED Discharge Orders     None            Cathren Laine, MD 01/30/24 2348

## 2024-01-30 NOTE — Discharge Instructions (Addendum)
 It was our pleasure to provide your ER care today - we hope that you feel better.  Your CT scan was read as showing: 1. No acute intra-abdominal pathology identified. 2. 3.3 cm rounded soft tissue mass within the left hemipelvis confluent with the adjacent iliac vasculature. While this is favored to represent a pathologically enlarged lymph node, a vascular aneurysm is not definitively excluded on this noncontrast examination. This could be confirmed with contrast enhanced CT examination or dedicated Doppler sonography. 3. Extensive aortoiliac atherosclerotic calcification.   Drink plenty of fluids/stay well hydrated.   Your blood sugar is high. Follow diabetes eating plan, continue diabetic meds, stay hydrated/drink adequate water, monitor sugars frequently and record values, and follow up with your doctor in the coming week.   Also follow up closely with your doctor in the coming week regarding your blood pressure that is high tonight. At follow up with your doctor, discuss above CT findings and have  them arrange further outpatient imaging to further evaluate the findings noted above.   Return to ER right away if worse, new symptoms, fevers, new or worsening or severe abdominal pain, persistent vomiting, weak/fainting, chest pain, trouble breathing, or other concern.

## 2024-01-31 DIAGNOSIS — N179 Acute kidney failure, unspecified: Secondary | ICD-10-CM | POA: Diagnosis not present

## 2024-01-31 LAB — BASIC METABOLIC PANEL WITH GFR
Anion gap: 12 (ref 5–15)
BUN: 19 mg/dL (ref 8–23)
CO2: 20 mmol/L — ABNORMAL LOW (ref 22–32)
Calcium: 9.5 mg/dL (ref 8.9–10.3)
Chloride: 101 mmol/L (ref 98–111)
Creatinine, Ser: 1.81 mg/dL — ABNORMAL HIGH (ref 0.61–1.24)
GFR, Estimated: 41 mL/min — ABNORMAL LOW (ref >60)
Glucose, Bld: 203 mg/dL — ABNORMAL HIGH (ref 70–99)
Potassium: 4.1 mmol/L (ref 3.5–5.1)
Sodium: 133 mmol/L — ABNORMAL LOW (ref 135–145)

## 2024-01-31 LAB — BETA-HYDROXYBUTYRIC ACID: Beta-Hydroxybutyric Acid: 0.89 mmol/L — ABNORMAL HIGH (ref 0.05–0.27)

## 2024-01-31 NOTE — ED Provider Notes (Signed)
  Provider Note MRN:  [id number]  Arrival date & time: 01/31/24    ED Course and Medical Decision Making  Assumed care of patient at sign-out or upon transfer.  Abdominal pain, hyperglycemia, CT abdomen overall reassuring.  Question of DKA, admission considered but patient does not want to stay, awaiting follow-up labs.  1:30 AM update: BMP upon repeat demonstrating a CO2 of 20, and improved blood glucose, and the anion gap is no longer elevated.  This would not suggest DKA.  Patient's beta hydroxybutyrate is elevated however I favor this elevation to be due to ketosis/recent starvation rather than DKA.  Patient is very well-appearing with normal vitals, I see no clear indication or benefit for admission at this time.  Appropriate for discharge with follow-up.  Procedures  Final Clinical Impressions(s) / ED Diagnoses     ICD-10-CM   1. Lower abdominal pain  R10.30     2. Hyperglycemia  R73.9     3. Dehydration  E86.0     4. Stage 3a chronic kidney disease (HCC)  N18.31     5. AKI (acute kidney injury) (HCC)  N17.9     6. Elevated blood pressure reading  R03.0     7. Essential hypertension  I10     8. Mass of soft tissue of pelvis  M79.89       ED Discharge Orders     None         Discharge Instructions      It was our pleasure to provide your ER care today - we hope that you feel better.  Your CT scan was read as showing: 1. No acute intra-abdominal pathology identified. 2. 3.3 cm rounded soft tissue mass within the left hemipelvis confluent with the adjacent iliac vasculature. While this is favored to represent a pathologically enlarged lymph node, a vascular aneurysm is not definitively excluded on this noncontrast examination. This could be confirmed with contrast enhanced CT examination or dedicated Doppler sonography. 3. Extensive aortoiliac atherosclerotic calcification.   Drink plenty of fluids/stay well hydrated.   Your blood sugar is high. Follow diabetes eating  plan, continue diabetic meds, stay hydrated/drink adequate water, monitor sugars frequently and record values, and follow up with your doctor in the coming week.   Also follow up closely with your doctor in the coming week regarding your blood pressure that is high tonight. At follow up with your doctor, discuss above CT findings and have  them arrange further outpatient imaging to further evaluate the findings noted above.   Return to ER right away if worse, new symptoms, fevers, new or worsening or severe abdominal pain, persistent vomiting, weak/fainting, chest pain, trouble breathing, or other concern.     Elmer Sow. Pilar Plate, MD [facility] [employer] [facility] [email or web address]    Sabas Sous, MD 01/31/24 [phone]

## 2024-02-03 ENCOUNTER — Ambulatory Visit: Payer: 59 | Admitting: Family Medicine

## 2024-02-03 ENCOUNTER — Encounter: Payer: Self-pay | Admitting: Family Medicine

## 2024-02-03 VITALS — BP 138/79 | HR 91 | Ht 67.0 in | Wt 135.0 lb

## 2024-02-03 DIAGNOSIS — E781 Pure hyperglyceridemia: Secondary | ICD-10-CM | POA: Diagnosis not present

## 2024-02-03 DIAGNOSIS — E1122 Type 2 diabetes mellitus with diabetic chronic kidney disease: Secondary | ICD-10-CM

## 2024-02-03 DIAGNOSIS — N1832 Chronic kidney disease, stage 3b: Secondary | ICD-10-CM | POA: Diagnosis not present

## 2024-02-03 DIAGNOSIS — N183 Chronic kidney disease, stage 3 unspecified: Secondary | ICD-10-CM

## 2024-02-03 DIAGNOSIS — I152 Hypertension secondary to endocrine disorders: Secondary | ICD-10-CM

## 2024-02-03 DIAGNOSIS — N1831 Chronic kidney disease, stage 3a: Secondary | ICD-10-CM

## 2024-02-03 DIAGNOSIS — E1159 Type 2 diabetes mellitus with other circulatory complications: Secondary | ICD-10-CM | POA: Diagnosis not present

## 2024-02-03 LAB — BAYER DCA HB A1C WAIVED: HB A1C (BAYER DCA - WAIVED): 8.1 % — ABNORMAL HIGH (ref 4.8–5.6)

## 2024-02-03 MED ORDER — TRESIBA FLEXTOUCH 100 UNIT/ML ~~LOC~~ SOPN: 7.0000 [IU] | PEN_INJECTOR | Freq: Every day | SUBCUTANEOUS | 3 refills | Status: DC

## 2024-02-03 NOTE — Progress Notes (Signed)
 BP 138/79   Pulse 91   Ht 5\' 7"  (1.702 m)   Wt 135 lb (61.2 kg)   SpO2 100%   BMI 21.14 kg/m    Subjective:   Patient ID: Anthony Price, male    DOB: 27-Jun-1956, 68 y.o.   MRN: [id number]  HPI: Anthony Price is a 68 y.o. male presenting on 02/03/2024 for Medical Management of Chronic Issues, Chronic Kidney Disease, and Diabetes   HPI Type 2 diabetes mellitus Patient comes in today for recheck of his diabetes. Patient has been currently taking [place] and metformin and Januvia. Patient is currently on an ACE inhibitor/ARB. Patient has not seen an ophthalmologist this year. Patient denies any new issues with their feet. The symptom started onset as an adult hypertension and hypertriglyceridemia ARE RELATED TO DM   Hypertension Patient is currently on amlodipine and carvedilol, and their blood pressure today is 138/79. Patient denies any lightheadedness or dizziness. Patient denies headaches, blurred vision, chest pains, shortness of breath, or weakness. Denies any side effects from medication and is content with current medication.   Hypertriglyceridemia and cholesterol recheck Patient is coming in for recheck of his hyperlipidemia. The patient is currently taking fenofibrate and atorvastatin. They deny any issues with myalgias or history of liver damage from it. They deny any focal numbness or weakness or chest pain.   Relevant past medical, surgical, family and social history reviewed and updated as indicated. Interim medical history since our last visit reviewed. Allergies and medications reviewed and updated.  Review of Systems  Constitutional:  Negative for chills and fever.  Eyes:  Negative for visual disturbance.  Respiratory:  Negative for shortness of breath and wheezing.   Cardiovascular:  Negative for chest pain and leg swelling.  Musculoskeletal:  Negative for back pain and gait problem.  Skin:  Negative for rash.  Neurological:  Negative for dizziness and  light-headedness.  All other systems reviewed and are negative.   Per HPI unless specifically indicated above   Allergies as of 02/03/2024       Reactions   Lactose Intolerance (gi)    bloating   Tramadol Nausea Only        Medication List        Accurate as of February 03, 2024 11:22 AM. If you have any questions, ask your nurse or doctor.          Accu-Chek Aviva Plus w/Device Kit Test qd. DX E11.9   Accu-Chek Softclix Lancets lancets Test once qd. DX E11.9   acetaminophen 500 MG tablet Commonly known as: TYLENOL Take 1,000 mg by mouth every 6 (six) hours as needed for moderate pain. Pain   ALPRAZolam 1 MG tablet Commonly known as: XANAX TAKE (1) TABLET BY MOUTH THREE TIMES DAILY.   amLODipine 2.5 MG tablet Commonly known as: NORVASC Take 1 tablet (2.5 mg total) by mouth daily.   aspirin EC 81 MG tablet Take 81 mg by mouth daily.   atorvastatin 40 MG tablet Commonly known as: LIPITOR Take 1 tablet (40 mg total) by mouth daily.   buPROPion 300 MG 24 hr tablet Commonly known as: WELLBUTRIN XL TAKE 1 TABLET BY MOUTH ONCE EVERY MORNING.   carvedilol 12.5 MG tablet Commonly known as: COREG TAKE (1) TABLET BY MOUTH TWICE DAILY.   cefadroxil 500 MG capsule Commonly known as: DURICEF Take 1 capsule (500 mg total) by mouth 2 (two) times daily.   dapagliflozin propanediol 10 MG Tabs tablet Commonly known as: [place] Take  1 tablet (10 mg total) by mouth daily before breakfast.   esomeprazole 20 MG capsule Commonly known as: NEXIUM TAKE (1) CAPSULE BY MOUTH TWICE DAILYBEFORE A MEAL   fenofibrate 48 MG tablet Commonly known as: Tricor Take 1 tablet (48 mg total) by mouth daily.   glucose blood test strip Commonly known as: Accu-Chek Aviva Plus Test once qd. DX E11.9   magnesium 30 MG tablet Take 30 mg by mouth 2 (two) times daily.   metFORMIN 500 MG tablet Commonly known as: GLUCOPHAGE Take 1 tablet (500 mg total) by mouth 2 (two) times daily with a  meal.   mupirocin ointment 2 % Commonly known as: BACTROBAN Apply 1 Application topically 2 (two) times daily.   mycophenolate 500 MG tablet Commonly known as: CELLCEPT Take 500-1,000 mg by mouth See admin instructions. Take 500 mg by mouth in the morning and 1000 mg at night   Omega-3 1000 MG Caps Take 1,000 mg by mouth in the morning and at bedtime.   PEN NEEDLES 31GX5/16" 31G X 8 MM Misc 1 each by Does not apply route daily.   PROBIOTIC DAILY PO Take 1 capsule by mouth daily.   ramipril 2.5 MG capsule Commonly known as: ALTACE Take 2.5 mg by mouth daily.   sildenafil 20 MG tablet Commonly known as: REVATIO TAKE 1-5 TABLETS BY MOUTH AS NEEDED   sitaGLIPtin 50 MG tablet Commonly known as: Januvia Take 1 tablet (50 mg total) by mouth daily.   tacrolimus 0.5 MG capsule Commonly known as: PROGRAF Take 0.5 mg by mouth 2 (two) times daily.   Evaristo Bury FlexTouch 100 UNIT/ML FlexTouch Pen Generic drug: insulin degludec Inject 7 Units into the skin daily. Started by: Elige Radon Ova Meegan   Vilazodone HCl 10 MG Tabs Commonly known as: VIIBRYD Take 1 tablet (10 mg total) by mouth daily.         Objective:   BP 138/79   Pulse 91   Ht 5\' 7"  (1.702 m)   Wt 135 lb (61.2 kg)   SpO2 100%   BMI 21.14 kg/m   Wt Readings from Last 3 Encounters:  02/03/24 135 lb (61.2 kg)  11/05/23 136 lb (61.7 kg)  09/30/23 137 lb (62.1 kg)    Physical Exam Vitals and nursing note reviewed.  Constitutional:      General: He is not in acute distress.    Appearance: He is well-developed. He is not diaphoretic.  Eyes:     General: No scleral icterus.    Conjunctiva/sclera: Conjunctivae normal.  Neck:     Thyroid: No thyromegaly.  Cardiovascular:     Rate and Rhythm: Normal rate and regular rhythm.     Heart sounds: Normal heart sounds. No murmur heard. Pulmonary:     Effort: Pulmonary effort is normal. No respiratory distress.     Breath sounds: Normal breath sounds. No wheezing.   Musculoskeletal:        General: No swelling. Normal range of motion.     Cervical back: Neck supple.  Lymphadenopathy:     Cervical: No cervical adenopathy.  Skin:    General: Skin is warm and dry.     Findings: No rash.  Neurological:     Mental Status: He is alert and oriented to person, place, and time.     Coordination: Coordination normal.  Psychiatric:        Behavior: Behavior normal.       Assessment & Plan:   Problem List Items Addressed This Visit  Cardiovascular and Mediastinum   Hypertension associated with diabetes (HCC)   Relevant Medications   insulin degludec (TRESIBA FLEXTOUCH) 100 UNIT/ML FlexTouch Pen     Endocrine   Diabetes type 2, controlled (HCC)   Relevant Medications   insulin degludec (TRESIBA FLEXTOUCH) 100 UNIT/ML FlexTouch Pen     Genitourinary   CKD (chronic kidney disease), stage III (HCC)     Other   Hypertriglyceridemia   Other Visit Diagnoses       Type 2 diabetes mellitus with stage 3b chronic kidney disease, without long-term current use of insulin (HCC)    -  Primary   Relevant Medications   insulin degludec (TRESIBA FLEXTOUCH) 100 UNIT/ML FlexTouch Pen   Other Relevant Orders   Bayer DCA Hb A1c Waived   Microalbumin/Creatinine Ratio, Urine     Blood pressure looks good today at 138 systolic.  A1c is up at 8.1.  Will start him on long-acting insulin.  Will start him at 7 units once a day.  Follow up plan: Return in about 3 months (around 05/04/2024), or if symptoms worsen or fail to improve, for Diabetes recheck.  Counseling provided for all of the vaccine components Orders Placed This Encounter  Procedures   Bayer DCA Hb A1c Waived   Microalbumin/Creatinine Ratio, Urine    Arville Care, MD [facility] 02/03/2024, 11:22 AM

## 2024-02-04 ENCOUNTER — Other Ambulatory Visit: Payer: Self-pay | Admitting: Family Medicine

## 2024-02-04 DIAGNOSIS — E1122 Type 2 diabetes mellitus with diabetic chronic kidney disease: Secondary | ICD-10-CM

## 2024-02-04 MED ORDER — PEN NEEDLES 5/16" 31G X 8 MM MISC: 1.0000 | Freq: Every day | 3 refills | Status: DC

## 2024-02-04 NOTE — Telephone Encounter (Signed)
 Copied from CRM [id number]. Topic: Clinical - Medication Refill >> Feb 04, 2024  8:40 AM Desma Mcgregor wrote: Most Recent Primary Care Visit:   Medication: INSULIN PEN NEEDLES FOR THE TRESIBA FLEXTOUCH that was received yesterday. Pt not sure how he is supposed to use the device without needles.  Has the patient contacted their pharmacy? Yes, and there is no rx on file  Is this the correct pharmacy for this prescription? Yes If no, delete pharmacy and type the correct one.  This is the patient's preferred pharmacy:  [facility] - [place], [place] - [address] [place] [address] Phone: [phone] Fax: [phone]   Has the prescription been filled recently? No  Is the patient out of the medication? Yes  Has the patient been seen for an appointment in the last year OR does the patient have an upcoming appointment? Yes  Can we respond through MyChart? Yes  Agent: Please be advised that Rx refills may take up to 3 business days. We ask that you follow-up with your pharmacy.

## 2024-02-05 DIAGNOSIS — R7989 Other specified abnormal findings of blood chemistry: Secondary | ICD-10-CM | POA: Diagnosis not present

## 2024-02-05 DIAGNOSIS — Z79899 Other long term (current) drug therapy: Secondary | ICD-10-CM | POA: Diagnosis not present

## 2024-02-05 DIAGNOSIS — E119 Type 2 diabetes mellitus without complications: Secondary | ICD-10-CM | POA: Diagnosis not present

## 2024-02-05 DIAGNOSIS — E559 Vitamin D deficiency, unspecified: Secondary | ICD-10-CM | POA: Diagnosis not present

## 2024-02-11 ENCOUNTER — Other Ambulatory Visit: Payer: Self-pay | Admitting: Family Medicine

## 2024-02-11 DIAGNOSIS — I10 Essential (primary) hypertension: Secondary | ICD-10-CM

## 2024-02-11 DIAGNOSIS — E1159 Type 2 diabetes mellitus with other circulatory complications: Secondary | ICD-10-CM

## 2024-02-11 DIAGNOSIS — E781 Pure hyperglyceridemia: Secondary | ICD-10-CM

## 2024-02-22 ENCOUNTER — Telehealth: Payer: Self-pay | Admitting: Family Medicine

## 2024-02-22 MED ORDER — ACCU-CHEK AVIVA PLUS VI STRP: ORAL_STRIP | 5 refills | Status: DC

## 2024-02-22 NOTE — Telephone Encounter (Signed)
 Last Fill: Today  Pt aware, script sent to pharmacy. Pt verbalized understanding.

## 2024-02-22 NOTE — Telephone Encounter (Signed)
 Yes that is fine, go up by 2 units every day

## 2024-02-22 NOTE — Telephone Encounter (Signed)
 Copied from CRM [id number]. Topic: Clinical - Medication Refill >> Feb 22, 2024  9:58 AM Leory Rands wrote: Most Recent Primary Care Visit:   Medication: ACCU-CHEK Guide SOFTCLIX test strips  Has the patient contacted their pharmacy? Yes (Agent: If no, request that the patient contact the pharmacy for the refill. If patient does not wish to contact the pharmacy document the reason why and proceed with request.) (Agent: If yes, when and what did the pharmacy advise?)  Is this the correct pharmacy for this prescription? Yes If no, delete pharmacy and type the correct one.  This is the patient's preferred pharmacy:  [facility] - [place], [place] - [address] [place] [address] Phone: [phone] Fax: [phone]   Has the prescription been filled recently? Yes  Is the patient out of the medication? Yes  Has the patient been seen for an appointment in the last year OR does the patient have an upcoming appointment? Yes  Can we respond through MyChart? Yes  Agent: Please be advised that Rx refills may take up to 3 business days. We ask that you follow-up with your pharmacy.

## 2024-02-22 NOTE — Telephone Encounter (Signed)
 Pt made aware and understood. States that he also needs his accu chek test strips sent in to [facility]. Refills sent.

## 2024-02-22 NOTE — Telephone Encounter (Signed)
 Copied from CRM [id number]. Topic: Clinical - Medication Question >> Feb 22, 2024 10:00 AM Leory Rands wrote: Reason for CRM: Patient is calling to report that his blood sugar has been ranging between 130-170. Should he increase insulin  degludec (TRESIBA  FLEXTOUCH) 100 UNIT/ML FlexTouch Pen [[id number]]?

## 2024-03-01 ENCOUNTER — Encounter (HOSPITAL_COMMUNITY): Payer: Self-pay | Admitting: Psychiatry

## 2024-03-01 ENCOUNTER — Telehealth (HOSPITAL_COMMUNITY): Payer: 59 | Admitting: Psychiatry

## 2024-03-01 DIAGNOSIS — F321 Major depressive disorder, single episode, moderate: Secondary | ICD-10-CM | POA: Diagnosis not present

## 2024-03-01 DIAGNOSIS — F418 Other specified anxiety disorders: Secondary | ICD-10-CM

## 2024-03-01 MED ORDER — VILAZODONE HCL 20 MG PO TABS: 20.0000 mg | ORAL_TABLET | Freq: Every day | ORAL | 2 refills | Status: DC

## 2024-03-01 MED ORDER — ALPRAZOLAM 1 MG PO TABS: ORAL_TABLET | ORAL | 2 refills | Status: DC

## 2024-03-01 MED ORDER — BUPROPION HCL ER (XL) 300 MG PO TB24: ORAL_TABLET | ORAL | 2 refills | Status: DC
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
## 2024-03-13 ENCOUNTER — Other Ambulatory Visit (HOSPITAL_COMMUNITY): Payer: Self-pay | Admitting: Psychiatry

## 2024-03-22 DIAGNOSIS — Z4821 Encounter for aftercare following heart transplant: Secondary | ICD-10-CM | POA: Diagnosis not present

## 2024-03-22 DIAGNOSIS — E7800 Pure hypercholesterolemia, unspecified: Secondary | ICD-10-CM | POA: Diagnosis not present

## 2024-03-22 DIAGNOSIS — I2510 Atherosclerotic heart disease of native coronary artery without angina pectoris: Secondary | ICD-10-CM | POA: Diagnosis not present

## 2024-03-22 DIAGNOSIS — M545 Low back pain, unspecified: Secondary | ICD-10-CM | POA: Diagnosis not present

## 2024-03-22 DIAGNOSIS — I340 Nonrheumatic mitral (valve) insufficiency: Secondary | ICD-10-CM | POA: Diagnosis not present

## 2024-03-22 DIAGNOSIS — R1909 Other intra-abdominal and pelvic swelling, mass and lump: Secondary | ICD-10-CM | POA: Diagnosis not present

## 2024-03-22 DIAGNOSIS — G8929 Other chronic pain: Secondary | ICD-10-CM | POA: Diagnosis not present

## 2024-03-22 DIAGNOSIS — E119 Type 2 diabetes mellitus without complications: Secondary | ICD-10-CM | POA: Diagnosis not present

## 2024-03-22 DIAGNOSIS — K219 Gastro-esophageal reflux disease without esophagitis: Secondary | ICD-10-CM | POA: Diagnosis not present

## 2024-03-22 DIAGNOSIS — E1122 Type 2 diabetes mellitus with diabetic chronic kidney disease: Secondary | ICD-10-CM | POA: Diagnosis not present

## 2024-03-22 DIAGNOSIS — I10 Essential (primary) hypertension: Secondary | ICD-10-CM | POA: Diagnosis not present

## 2024-03-22 DIAGNOSIS — E785 Hyperlipidemia, unspecified: Secondary | ICD-10-CM | POA: Diagnosis not present

## 2024-03-22 DIAGNOSIS — Z79899 Other long term (current) drug therapy: Secondary | ICD-10-CM | POA: Diagnosis not present

## 2024-03-22 DIAGNOSIS — Z7984 Long term (current) use of oral hypoglycemic drugs: Secondary | ICD-10-CM | POA: Diagnosis not present

## 2024-03-22 DIAGNOSIS — N183 Chronic kidney disease, stage 3 unspecified: Secondary | ICD-10-CM | POA: Diagnosis not present

## 2024-03-22 DIAGNOSIS — Z941 Heart transplant status: Secondary | ICD-10-CM | POA: Diagnosis not present

## 2024-03-22 DIAGNOSIS — Z794 Long term (current) use of insulin: Secondary | ICD-10-CM | POA: Diagnosis not present

## 2024-03-22 DIAGNOSIS — N189 Chronic kidney disease, unspecified: Secondary | ICD-10-CM | POA: Diagnosis not present

## 2024-03-22 DIAGNOSIS — I129 Hypertensive chronic kidney disease with stage 1 through stage 4 chronic kidney disease, or unspecified chronic kidney disease: Secondary | ICD-10-CM | POA: Diagnosis not present

## 2024-03-23 DIAGNOSIS — Z941 Heart transplant status: Secondary | ICD-10-CM | POA: Diagnosis not present

## 2024-05-05 ENCOUNTER — Other Ambulatory Visit: Payer: Self-pay | Admitting: Family Medicine

## 2024-05-09 ENCOUNTER — Encounter: Payer: Self-pay | Admitting: Family Medicine

## 2024-05-09 ENCOUNTER — Ambulatory Visit: Admitting: Family Medicine

## 2024-05-09 VITALS — BP 123/67 | HR 90 | Ht 67.0 in | Wt 137.0 lb

## 2024-05-09 DIAGNOSIS — N1832 Chronic kidney disease, stage 3b: Secondary | ICD-10-CM

## 2024-05-09 DIAGNOSIS — I152 Hypertension secondary to endocrine disorders: Secondary | ICD-10-CM | POA: Diagnosis not present

## 2024-05-09 DIAGNOSIS — E781 Pure hyperglyceridemia: Secondary | ICD-10-CM

## 2024-05-09 DIAGNOSIS — E1122 Type 2 diabetes mellitus with diabetic chronic kidney disease: Secondary | ICD-10-CM | POA: Diagnosis not present

## 2024-05-09 DIAGNOSIS — I10 Essential (primary) hypertension: Secondary | ICD-10-CM | POA: Diagnosis not present

## 2024-05-09 DIAGNOSIS — Z7984 Long term (current) use of oral hypoglycemic drugs: Secondary | ICD-10-CM | POA: Diagnosis not present

## 2024-05-09 DIAGNOSIS — E1159 Type 2 diabetes mellitus with other circulatory complications: Secondary | ICD-10-CM

## 2024-05-09 DIAGNOSIS — N1831 Chronic kidney disease, stage 3a: Secondary | ICD-10-CM

## 2024-05-09 DIAGNOSIS — N529 Male erectile dysfunction, unspecified: Secondary | ICD-10-CM

## 2024-05-09 LAB — BAYER DCA HB A1C WAIVED: HB A1C (BAYER DCA - WAIVED): 7.4 % — ABNORMAL HIGH (ref 4.8–5.6)

## 2024-05-09 LAB — LIPID PANEL

## 2024-05-09 MED ORDER — SILDENAFIL CITRATE 20 MG PO TABS: 20.0000 mg | ORAL_TABLET | Freq: Every day | ORAL | 2 refills | Status: DC | PRN

## 2024-05-09 MED ORDER — CARVEDILOL 12.5 MG PO TABS: ORAL_TABLET | ORAL | 3 refills | Status: AC

## 2024-05-09 MED ORDER — AMLODIPINE BESYLATE 2.5 MG PO TABS: 2.5000 mg | ORAL_TABLET | Freq: Every day | ORAL | 3 refills | Status: AC

## 2024-05-09 MED ORDER — TRESIBA FLEXTOUCH 100 UNIT/ML ~~LOC~~ SOPN: 9.0000 [IU] | PEN_INJECTOR | Freq: Every day | SUBCUTANEOUS | 3 refills | Status: AC

## 2024-05-09 MED ORDER — ATORVASTATIN CALCIUM 40 MG PO TABS
40.0000 mg | ORAL_TABLET | Freq: Every day | ORAL | 3 refills | Status: AC
Start: 2024-05-09 — End: ?

## 2024-05-09 MED ORDER — FENOFIBRATE 48 MG PO TABS: 48.0000 mg | ORAL_TABLET | Freq: Every day | ORAL | 3 refills | Status: AC

## 2024-05-09 NOTE — Progress Notes (Signed)
 BP 123/67   Pulse 90   Ht 5' 7 (1.702 m)   Wt 137 lb (62.1 kg)   SpO2 99%   BMI 21.46 kg/m    Subjective:   Patient ID: Anthony Price, male    DOB: 18-Mar-1956, 68 y.o.   MRN: [id number]  HPI: Anthony Price is a 68 y.o. male presenting on 05/09/2024 for Medical Management of Chronic Issues, Diabetes, and Chronic Kidney Disease   HPI Type 2 diabetes mellitus Patient comes in today for recheck of his diabetes. Patient has been currently taking Farxiga  and metformin  and Januvia . Patient is currently on an ACE inhibitor/ARB. Patient has not seen an ophthalmologist this year. Patient denies any new issues with their feet. The symptom started onset as an adult hypertension and hypertriglyceridemia and CKD and history of heart transplant ARE RELATED TO DM   Hypertension Patient is currently on amlodipine  and carvedilol  and ramipril, and their blood pressure today is 123/67. Patient denies any lightheadedness or dizziness. Patient denies headaches, blurred vision, chest pains, shortness of breath, or weakness. Denies any side effects from medication and is content with current medication.   Hypertriglyceridemia Patient is coming in for recheck of his hyperlipidemia. The patient is currently taking fenofibrate  and fish oils. They deny any issues with myalgias or history of liver damage from it. They deny any focal numbness or weakness or chest pain.   Relevant past medical, surgical, family and social history reviewed and updated as indicated. Interim medical history since our last visit reviewed. Allergies and medications reviewed and updated.  Review of Systems  Constitutional:  Negative for chills and fever.  Eyes:  Negative for visual disturbance.  Respiratory:  Negative for shortness of breath and wheezing.   Cardiovascular:  Negative for chest pain and leg swelling.  Musculoskeletal:  Negative for back pain and gait problem.  Skin:  Negative for rash.  Neurological:   Negative for dizziness and light-headedness.  All other systems reviewed and are negative.   Per HPI unless specifically indicated above   Allergies as of 05/09/2024       Reactions   Lactose Intolerance (gi)    bloating   Tramadol Nausea Only        Medication List        Accurate as of May 09, 2024 11:59 PM. If you have any questions, ask your nurse or doctor.          Accu-Chek Aviva Plus test strip Generic drug: glucose blood Test once qd. DX E11.9   Accu-Chek Aviva Plus w/Device Kit Test qd. DX E11.9   Accu-Chek Softclix Lancets lancets Test once qd. DX E11.9   acetaminophen 500 MG tablet Commonly known as: TYLENOL Take 1,000 mg by mouth every 6 (six) hours as needed for moderate pain. Pain   ALPRAZolam  1 MG tablet Commonly known as: XANAX  TAKE (1) TABLET BY MOUTH THREE TIMES DAILY.   amLODipine  2.5 MG tablet Commonly known as: NORVASC  Take 1 tablet (2.5 mg total) by mouth daily.   aspirin EC 81 MG tablet Take 81 mg by mouth daily.   atorvastatin  40 MG tablet Commonly known as: LIPITOR Take 1 tablet (40 mg total) by mouth daily.   buPROPion  300 MG 24 hr tablet Commonly known as: WELLBUTRIN  XL TAKE 1 TABLET BY MOUTH ONCE EVERY MORNING.   carvedilol  12.5 MG tablet Commonly known as: COREG  TAKE (1) TABLET BY MOUTH TWICE DAILY.   cefadroxil  500 MG capsule Commonly known as: DURICEF Take 1  capsule (500 mg total) by mouth 2 (two) times daily.   dapagliflozin  propanediol 10 MG Tabs tablet Commonly known as: Farxiga  Take 1 tablet (10 mg total) by mouth daily before breakfast.   esomeprazole  20 MG capsule Commonly known as: NEXIUM  TAKE (1) CAPSULE BY MOUTH TWICE DAILYBEFORE A MEAL   fenofibrate  48 MG tablet Commonly known as: TRICOR  Take 1 tablet (48 mg total) by mouth daily.   magnesium  30 MG tablet Take 30 mg by mouth 2 (two) times daily.   metFORMIN  500 MG tablet Commonly known as: GLUCOPHAGE  Take 1 tablet (500 mg total) by mouth 2  (two) times daily with a meal.   mupirocin  ointment 2 % Commonly known as: BACTROBAN  Apply 1 Application topically 2 (two) times daily.   mycophenolate 500 MG tablet Commonly known as: CELLCEPT Take 500-1,000 mg by mouth See admin instructions. Take 500 mg by mouth in the morning and 1000 mg at night   Omega-3 1000 MG Caps Take 1,000 mg by mouth in the morning and at bedtime.   PEN NEEDLES 31GX5/16 31G X 8 MM Misc 1 each by Does not apply route daily.   PROBIOTIC DAILY PO Take 1 capsule by mouth daily.   ramipril 2.5 MG capsule Commonly known as: ALTACE Take 2.5 mg by mouth daily.   sildenafil  20 MG tablet Commonly known as: REVATIO  Take 1-5 tablets (20-100 mg total) by mouth daily as needed. What changed: See the new instructions. Changed by: Fonda LABOR Jersi Mcmaster   sitaGLIPtin  50 MG tablet Commonly known as: Januvia  Take 1 tablet (50 mg total) by mouth daily.   tacrolimus  0.5 MG capsule Commonly known as: PROGRAF  Take 0.5 mg by mouth 2 (two) times daily.   Tresiba  FlexTouch 100 UNIT/ML FlexTouch Pen Generic drug: insulin  degludec Inject 9 Units into the skin daily. Inject 9 Units into the skin daily. What changed:  how much to take additional instructions Changed by: Fonda LABOR Kule Gascoigne   Vilazodone  HCl 10 MG Tabs Commonly known as: VIIBRYD  Take 1 tablet (10 mg total) by mouth daily.   Vilazodone  HCl 20 MG Tabs Commonly known as: Viibryd  Take 1 tablet (20 mg total) by mouth daily.         Objective:   BP 123/67   Pulse 90   Ht 5' 7 (1.702 m)   Wt 137 lb (62.1 kg)   SpO2 99%   BMI 21.46 kg/m   Wt Readings from Last 3 Encounters:  05/09/24 137 lb (62.1 kg)  02/03/24 135 lb (61.2 kg)  11/05/23 136 lb (61.7 kg)    Physical Exam Vitals and nursing note reviewed.  Constitutional:      General: He is not in acute distress.    Appearance: He is well-developed. He is not diaphoretic.  Eyes:     General: No scleral icterus.    Conjunctiva/sclera:  Conjunctivae normal.  Neck:     Thyroid : No thyromegaly.  Cardiovascular:     Rate and Rhythm: Normal rate and regular rhythm.     Heart sounds: Normal heart sounds. No murmur heard. Pulmonary:     Effort: Pulmonary effort is normal. No respiratory distress.     Breath sounds: Normal breath sounds. No wheezing.  Musculoskeletal:        General: No swelling. Normal range of motion.     Cervical back: Neck supple.  Lymphadenopathy:     Cervical: No cervical adenopathy.  Skin:    General: Skin is warm and dry.     Findings:  No rash.  Neurological:     Mental Status: He is alert and oriented to person, place, and time.     Coordination: Coordination normal.  Psychiatric:        Behavior: Behavior normal.       Assessment & Plan:   Problem List Items Addressed This Visit       Cardiovascular and Mediastinum   Hypertension associated with diabetes (HCC)   Relevant Medications   amLODipine  (NORVASC ) 2.5 MG tablet   atorvastatin  (LIPITOR) 40 MG tablet   carvedilol  (COREG ) 12.5 MG tablet   fenofibrate  (TRICOR ) 48 MG tablet   insulin  degludec (TRESIBA  FLEXTOUCH) 100 UNIT/ML FlexTouch Pen   sildenafil  (REVATIO ) 20 MG tablet     Endocrine   Diabetes type 2, controlled (HCC)   Relevant Medications   atorvastatin  (LIPITOR) 40 MG tablet   insulin  degludec (TRESIBA  FLEXTOUCH) 100 UNIT/ML FlexTouch Pen     Genitourinary   CKD (chronic kidney disease), stage III (HCC)   Relevant Medications   amLODipine  (NORVASC ) 2.5 MG tablet     Other   Hypertriglyceridemia   Relevant Medications   amLODipine  (NORVASC ) 2.5 MG tablet   atorvastatin  (LIPITOR) 40 MG tablet   carvedilol  (COREG ) 12.5 MG tablet   fenofibrate  (TRICOR ) 48 MG tablet   sildenafil  (REVATIO ) 20 MG tablet   Other Visit Diagnoses       Type 2 diabetes mellitus with stage 3b chronic kidney disease, without long-term current use of insulin  (HCC)    -  Primary   Relevant Medications   atorvastatin  (LIPITOR) 40 MG  tablet   insulin  degludec (TRESIBA  FLEXTOUCH) 100 UNIT/ML FlexTouch Pen   Other Relevant Orders   Bayer DCA Hb A1c Waived (Completed)   CBC with Differential/Platelet (Completed)   CMP14+EGFR (Completed)   Lipid panel (Completed)   Microalbumin/Creatinine Ratio, Urine (Completed)     Essential hypertension       Relevant Medications   amLODipine  (NORVASC ) 2.5 MG tablet   atorvastatin  (LIPITOR) 40 MG tablet   carvedilol  (COREG ) 12.5 MG tablet   fenofibrate  (TRICOR ) 48 MG tablet   sildenafil  (REVATIO ) 20 MG tablet     Erectile dysfunction, unspecified erectile dysfunction type       Relevant Medications   sildenafil  (REVATIO ) 20 MG tablet     Patient needed refill of CV medicine, seems to be doing well on  Blood pressure looks great today. A1c 7.4, better than last time so continue to watch diet and focus on it. Follow up plan: Return in about 3 months (around 08/09/2024), or if symptoms worsen or fail to improve, for Diabetes.  Counseling provided for all of the vaccine components Orders Placed This Encounter  Procedures   Bayer DCA Hb A1c Waived   CBC with Differential/Platelet   CMP14+EGFR   Lipid panel   Microalbumin/Creatinine Ratio, Urine    Fonda Levins, MD [facility] 05/13/2024, 3:24 PM

## 2024-05-10 LAB — CMP14+EGFR
ALT: 6 IU/L (ref 0–44)
AST: 11 IU/L (ref 0–40)
Albumin: 4.4 g/dL (ref 3.9–4.9)
Alkaline Phosphatase: 49 IU/L (ref 44–121)
BUN/Creatinine Ratio: 13 (ref 10–24)
BUN: 26 mg/dL (ref 8–27)
Bilirubin Total: 0.3 mg/dL (ref 0.0–1.2)
CO2: 20 mmol/L (ref 20–29)
Calcium: 9.6 mg/dL (ref 8.6–10.2)
Chloride: 103 mmol/L (ref 96–106)
Creatinine, Ser: 1.97 mg/dL — AB (ref 0.76–1.27)
Globulin, Total: 2.5 g/dL (ref 1.5–4.5)
Glucose: 267 mg/dL — AB (ref 70–99)
Potassium: 5.1 mmol/L (ref 3.5–5.2)
Sodium: 137 mmol/L (ref 134–144)
Total Protein: 6.9 g/dL (ref 6.0–8.5)
eGFR: 37 mL/min/1.73 — AB (ref >59)

## 2024-05-10 LAB — LIPID PANEL
Cholesterol, Total: 96 mg/dL — AB (ref 100–199)
HDL: 24 mg/dL — AB (ref >39)
LDL CALC COMMENT:: 4 ratio (ref 0.0–5.0)
LDL Chol Calc (NIH): 47 mg/dL (ref 0–99)
Triglycerides: 143 mg/dL (ref 0–149)
VLDL Cholesterol Cal: 25 mg/dL (ref 5–40)

## 2024-05-10 LAB — CBC WITH DIFFERENTIAL/PLATELET
Basophils Absolute: 0.1 x10E3/uL (ref 0.0–0.2)
Basos: 1 %
EOS (ABSOLUTE): 0.1 x10E3/uL (ref 0.0–0.4)
Eos: 2 %
Hematocrit: 38.3 % (ref 37.5–51.0)
Hemoglobin: 10.7 g/dL — ABNORMAL LOW (ref 13.0–17.7)
Immature Grans (Abs): 0 x10E3/uL (ref 0.0–0.1)
Immature Granulocytes: 0 %
Lymphocytes Absolute: 2.4 x10E3/uL (ref 0.7–3.1)
Lymphs: 38 %
MCH: 21.1 pg — ABNORMAL LOW (ref 26.6–33.0)
MCHC: 27.9 g/dL — ABNORMAL LOW (ref 31.5–35.7)
MCV: 75 fL — ABNORMAL LOW (ref 79–97)
Monocytes Absolute: 0.7 x10E3/uL (ref 0.1–0.9)
Monocytes: 10 %
Neutrophils Absolute: 3.1 x10E3/uL (ref 1.4–7.0)
Neutrophils: 49 %
Platelets: 205 x10E3/uL (ref 150–450)
RBC: 5.08 x10E6/uL (ref 4.14–5.80)
RDW: 16.3 % — ABNORMAL HIGH (ref 11.6–15.4)
WBC: 6.3 x10E3/uL (ref 3.4–10.8)

## 2024-05-10 LAB — MICROALBUMIN / CREATININE URINE RATIO
Creatinine, Urine: 97.1 mg/dL
Microalb/Creat Ratio: 36 mg/g{creat} — ABNORMAL HIGH (ref 0–29)
Microalbumin, Urine: 35.1 ug/mL

## 2024-05-16 ENCOUNTER — Ambulatory Visit: Payer: Self-pay | Admitting: Family Medicine

## 2024-05-16 DIAGNOSIS — N183 Chronic kidney disease, stage 3 unspecified: Secondary | ICD-10-CM | POA: Diagnosis not present

## 2024-05-16 DIAGNOSIS — I129 Hypertensive chronic kidney disease with stage 1 through stage 4 chronic kidney disease, or unspecified chronic kidney disease: Secondary | ICD-10-CM | POA: Diagnosis not present

## 2024-05-16 DIAGNOSIS — Z941 Heart transplant status: Secondary | ICD-10-CM | POA: Diagnosis not present

## 2024-05-16 DIAGNOSIS — E1122 Type 2 diabetes mellitus with diabetic chronic kidney disease: Secondary | ICD-10-CM | POA: Diagnosis not present

## 2024-05-17 DIAGNOSIS — Z48298 Encounter for aftercare following other organ transplant: Secondary | ICD-10-CM | POA: Diagnosis not present

## 2024-05-17 DIAGNOSIS — Z79899 Other long term (current) drug therapy: Secondary | ICD-10-CM | POA: Diagnosis not present

## 2024-05-17 DIAGNOSIS — R7989 Other specified abnormal findings of blood chemistry: Secondary | ICD-10-CM | POA: Diagnosis not present

## 2024-05-17 DIAGNOSIS — Z941 Heart transplant status: Secondary | ICD-10-CM | POA: Diagnosis not present

## 2024-06-01 ENCOUNTER — Encounter (HOSPITAL_COMMUNITY): Payer: Self-pay | Admitting: Psychiatry

## 2024-06-01 ENCOUNTER — Telehealth (INDEPENDENT_AMBULATORY_CARE_PROVIDER_SITE_OTHER): Admitting: Psychiatry

## 2024-06-01 DIAGNOSIS — F321 Major depressive disorder, single episode, moderate: Secondary | ICD-10-CM | POA: Diagnosis not present

## 2024-06-01 MED ORDER — ALPRAZOLAM 1 MG PO TABS: ORAL_TABLET | ORAL | 2 refills | Status: DC

## 2024-06-01 MED ORDER — BUPROPION HCL ER (XL) 300 MG PO TB24: ORAL_TABLET | ORAL | 2 refills | Status: DC

## 2024-06-01 MED ORDER — VILAZODONE HCL 20 MG PO TABS: 20.0000 mg | ORAL_TABLET | Freq: Every day | ORAL | 2 refills | Status: DC

## 2024-06-01 MED ORDER — VILAZODONE HCL 10 MG PO TABS: 10.0000 mg | ORAL_TABLET | Freq: Every day | ORAL | 2 refills | Status: DC
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
## 2024-06-16 DIAGNOSIS — G8929 Other chronic pain: Secondary | ICD-10-CM | POA: Diagnosis not present

## 2024-06-16 DIAGNOSIS — Z885 Allergy status to narcotic agent status: Secondary | ICD-10-CM | POA: Diagnosis not present

## 2024-06-16 DIAGNOSIS — M549 Dorsalgia, unspecified: Secondary | ICD-10-CM | POA: Diagnosis not present

## 2024-06-16 DIAGNOSIS — I255 Ischemic cardiomyopathy: Secondary | ICD-10-CM | POA: Diagnosis not present

## 2024-06-16 DIAGNOSIS — Z87891 Personal history of nicotine dependence: Secondary | ICD-10-CM | POA: Diagnosis not present

## 2024-06-16 DIAGNOSIS — Z7982 Long term (current) use of aspirin: Secondary | ICD-10-CM | POA: Diagnosis not present

## 2024-06-16 DIAGNOSIS — I2510 Atherosclerotic heart disease of native coronary artery without angina pectoris: Secondary | ICD-10-CM | POA: Diagnosis not present

## 2024-06-16 DIAGNOSIS — Z79899 Other long term (current) drug therapy: Secondary | ICD-10-CM | POA: Diagnosis not present

## 2024-06-16 DIAGNOSIS — D649 Anemia, unspecified: Secondary | ICD-10-CM | POA: Diagnosis not present

## 2024-06-16 DIAGNOSIS — R161 Splenomegaly, not elsewhere classified: Secondary | ICD-10-CM | POA: Diagnosis not present

## 2024-06-16 DIAGNOSIS — R5381 Other malaise: Secondary | ICD-10-CM | POA: Diagnosis not present

## 2024-06-16 DIAGNOSIS — R519 Headache, unspecified: Secondary | ICD-10-CM | POA: Diagnosis not present

## 2024-06-16 DIAGNOSIS — E1122 Type 2 diabetes mellitus with diabetic chronic kidney disease: Secondary | ICD-10-CM | POA: Diagnosis not present

## 2024-06-16 DIAGNOSIS — N189 Chronic kidney disease, unspecified: Secondary | ICD-10-CM | POA: Diagnosis not present

## 2024-06-16 DIAGNOSIS — R937 Abnormal findings on diagnostic imaging of other parts of musculoskeletal system: Secondary | ICD-10-CM | POA: Diagnosis not present

## 2024-06-16 DIAGNOSIS — Z941 Heart transplant status: Secondary | ICD-10-CM | POA: Diagnosis not present

## 2024-06-16 DIAGNOSIS — R591 Generalized enlarged lymph nodes: Secondary | ICD-10-CM | POA: Diagnosis not present

## 2024-06-16 DIAGNOSIS — R5382 Chronic fatigue, unspecified: Secondary | ICD-10-CM | POA: Diagnosis not present

## 2024-06-16 DIAGNOSIS — E785 Hyperlipidemia, unspecified: Secondary | ICD-10-CM | POA: Diagnosis not present

## 2024-06-16 DIAGNOSIS — R509 Fever, unspecified: Secondary | ICD-10-CM | POA: Diagnosis not present

## 2024-06-16 DIAGNOSIS — R935 Abnormal findings on diagnostic imaging of other abdominal regions, including retroperitoneum: Secondary | ICD-10-CM | POA: Diagnosis not present

## 2024-06-16 DIAGNOSIS — Z7984 Long term (current) use of oral hypoglycemic drugs: Secondary | ICD-10-CM | POA: Diagnosis not present

## 2024-06-16 DIAGNOSIS — I129 Hypertensive chronic kidney disease with stage 1 through stage 4 chronic kidney disease, or unspecified chronic kidney disease: Secondary | ICD-10-CM | POA: Diagnosis not present

## 2024-06-29 DIAGNOSIS — R591 Generalized enlarged lymph nodes: Secondary | ICD-10-CM | POA: Diagnosis not present

## 2024-07-11 DIAGNOSIS — Z7984 Long term (current) use of oral hypoglycemic drugs: Secondary | ICD-10-CM | POA: Diagnosis not present

## 2024-07-11 DIAGNOSIS — R590 Localized enlarged lymph nodes: Secondary | ICD-10-CM | POA: Diagnosis not present

## 2024-07-11 DIAGNOSIS — Z7982 Long term (current) use of aspirin: Secondary | ICD-10-CM | POA: Diagnosis not present

## 2024-07-11 DIAGNOSIS — Z794 Long term (current) use of insulin: Secondary | ICD-10-CM | POA: Diagnosis not present

## 2024-07-11 DIAGNOSIS — Z79621 Long term (current) use of calcineurin inhibitor: Secondary | ICD-10-CM | POA: Diagnosis not present

## 2024-07-11 DIAGNOSIS — Z79899 Other long term (current) drug therapy: Secondary | ICD-10-CM | POA: Diagnosis not present

## 2024-07-11 DIAGNOSIS — I252 Old myocardial infarction: Secondary | ICD-10-CM | POA: Diagnosis not present

## 2024-07-11 DIAGNOSIS — N189 Chronic kidney disease, unspecified: Secondary | ICD-10-CM | POA: Diagnosis not present

## 2024-07-11 DIAGNOSIS — M1990 Unspecified osteoarthritis, unspecified site: Secondary | ICD-10-CM | POA: Diagnosis not present

## 2024-07-11 DIAGNOSIS — I129 Hypertensive chronic kidney disease with stage 1 through stage 4 chronic kidney disease, or unspecified chronic kidney disease: Secondary | ICD-10-CM | POA: Diagnosis not present

## 2024-07-11 DIAGNOSIS — I2510 Atherosclerotic heart disease of native coronary artery without angina pectoris: Secondary | ICD-10-CM | POA: Diagnosis not present

## 2024-07-11 DIAGNOSIS — E1122 Type 2 diabetes mellitus with diabetic chronic kidney disease: Secondary | ICD-10-CM | POA: Diagnosis not present

## 2024-07-11 DIAGNOSIS — K219 Gastro-esophageal reflux disease without esophagitis: Secondary | ICD-10-CM | POA: Diagnosis not present

## 2024-07-11 DIAGNOSIS — R591 Generalized enlarged lymph nodes: Secondary | ICD-10-CM | POA: Diagnosis not present

## 2024-07-11 DIAGNOSIS — E785 Hyperlipidemia, unspecified: Secondary | ICD-10-CM | POA: Diagnosis not present

## 2024-07-11 DIAGNOSIS — R2681 Unsteadiness on feet: Secondary | ICD-10-CM | POA: Diagnosis not present

## 2024-07-11 DIAGNOSIS — Z941 Heart transplant status: Secondary | ICD-10-CM | POA: Diagnosis not present

## 2024-07-11 DIAGNOSIS — R599 Enlarged lymph nodes, unspecified: Secondary | ICD-10-CM | POA: Diagnosis not present

## 2024-07-11 DIAGNOSIS — Z87891 Personal history of nicotine dependence: Secondary | ICD-10-CM | POA: Diagnosis not present

## 2024-07-11 NOTE — Unmapped External Note (Signed)
 Brief Operative Note (CSN: [id number])   Date of Surgery: 07/11/2024  Pre-op Diagnosis: Adenopathy  Post-op Diagnosis: same  Procedure(s): BX/EXC LYMPH NODE; OPEN, DEEP AXILRY NODE: 61474 (CPT)  Note: Revisions to procedures should be made in chart - see Procedures activity.  Performing Service: Surgical Oncology Surgeons and Role:    * Madelynn Inge CROME, DO - Primary    * Dietrich Alyce DASEN, MD - Resident - Assisting  Assistant: None  Findings: L axillary lymph node cluster sent.   Anesthesia: General  Estimated Blood Loss: 10 mL  Complications: None  Specimens:  ID Type Source Tests Collected by Time Destination  1 : left axillary lymph node- fresh for lymphoma work out Tissue Lymph Node SURGICAL PATHOLOGY EXAM Madelynn Inge CROME, DO 07/11/2024 1250   2 : additional left axillary lymph node- fresh for lymphoma work out Tissue Lymph Node SURGICAL PATHOLOGY ERASMO Madelynn Inge CROME, DO 07/11/2024 1314     Implants: * No implants in log *   Alyce DASEN Dietrich, MD  Date: 07/11/2024  Time: 1:31 PM

## 2024-07-11 NOTE — Unmapped External Note (Signed)
 BX/EXC LYMPH NODE; OPEN, DEEP AXILRY NODE  Operative Note (CSN: [id number])  Service  Date of Surgery: 07/11/2024 Admit Date: 07/11/2024 Performing Service: Surgical Oncology Surgeons and Role:    * Madelynn Anthony CROME, DO - Primary    * Dietrich Alyce DASEN, MD - Resident - Assisting   Operative Note  Pre-op Diagnosis: Adenopathy  Post-op Diagnosis: Same  Note: Revisions to procedures should be made in chart - see Procedures tab. Left - BX/EXC LYMPH NODE; OPEN, DEEP AXILRY NODE  Procedure: L axillary LN biopsy  Findings:  -No clinically enlarged LN in left axilla -Multiple LN's sent to pathology for fresh analysis  Anesthesia: General  EBL: 10 mL  IVF:  Specimens:  ID Type Source Tests Collected by Time Destination  1 : left axillary lymph node- fresh for lymphoma work out Tissue Lymph Node SURGICAL PATHOLOGY EXAM Madelynn Anthony CROME, DO 07/11/2024 1250   2 : additional left axillary lymph node- fresh for lymphoma work out Tissue Lymph Node SURGICAL PATHOLOGY EXAM Madelynn Anthony CROME, DO 07/11/2024 1314     Indications for Surgery:  Anthony Price is a 68 year old male with a 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  He was recently diagnosed with Epstein-Barr virus and recent PET scan imaging demonstrated diffuse adenopathy concerning for a lymphoproliferative disorder.  He presents to the operating room today for the above procedure to rule out underlying malignancy.  Description of Procedure:   After informed consent was obtained, Anthony Price was taken to the OR suite and placed supine on the OR table.  General anesthesia was induced and SCDs were placed on both lower extremities.  Preoperative antibiotics were administered.  His left axilla was prepped and draped in a sterile fashion an operative timeout was performed.  We began by creating a small incision at the inferior aspect of the hairbearing area.  The area of the proposed incision was  anesthetized with quarter percent Marcaine with epinephrine .  Electrocautery was used to dissect down to and through the clavipectoral fascia.  Palpation demonstrated there to be no clinically enlarged lymph nodes within the left axillary space.  We identified multiple clustered nodes which were excised in 2 separate specimens and sent to pathology for permanent analysis.  The first specimen was sent as left axillary lymph node contents.  We sent a second bundle of 3-4 lymph nodes labeled as additional left axillary lymph nodes.  These were sent to pathology for fresh analysis.  The wound was irrigated.  Hemostasis was ensured.  We then closed the wound by reapproximating the clavipectoral fascia with interrupted Vicryl.  The skin was closed with 3-0 Vicryl deep dermal followed by running 4-0 Monocryl subcuticular and topical Dermabond.  Anthony Price was awoken, extubated and transported to PACU in stable condition.  All final sponge and needle counts were correct.  He tolerated the procedure well with no immediate complications.  I was present for and participated in the entire procedure.  Anthony Price Madelynn, DO  Date: 07/11/2024  Time: 1:26 PM

## 2024-07-20 DIAGNOSIS — L7633 Postprocedural seroma of skin and subcutaneous tissue following a dermatologic procedure: Secondary | ICD-10-CM | POA: Diagnosis not present

## 2024-07-20 DIAGNOSIS — R591 Generalized enlarged lymph nodes: Secondary | ICD-10-CM | POA: Diagnosis not present
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
## 2024-07-22 NOTE — Telephone Encounter (Signed)
 Copied from CRM #[id number]. Topic: Care Management - Discuss Care Plan >> Jul 22, 2024  8:47 AM Almeda NOVAK wrote: [facility] Other Call ONC Phone Room Smart Lists: Results -   Hi,   Fallon contacted the Communication Center requesting results of the following:   Procedure: Biopsy Completed On: 9/15  Please contact Darina Reggy Buff at [phone] for proper follow up.  Thank you,  Almeda GORMAN [facility] Communication Center  [phone]  [facility]

## 2024-07-22 NOTE — Telephone Encounter (Signed)
 Pt states the seroma that was drained on Wed has returned and may be larger. Denies fever/chills, fatigue at this time. Informed Pt cytogenics results are still pending at this time. Msg fwd to care team.  Katelynn E Kallam, RN

## 2024-07-26 DIAGNOSIS — I252 Old myocardial infarction: Secondary | ICD-10-CM | POA: Diagnosis not present

## 2024-07-26 DIAGNOSIS — I255 Ischemic cardiomyopathy: Secondary | ICD-10-CM | POA: Diagnosis not present

## 2024-07-26 DIAGNOSIS — Z794 Long term (current) use of insulin: Secondary | ICD-10-CM | POA: Diagnosis not present

## 2024-07-26 DIAGNOSIS — Z79899 Other long term (current) drug therapy: Secondary | ICD-10-CM | POA: Diagnosis not present

## 2024-07-26 DIAGNOSIS — E1122 Type 2 diabetes mellitus with diabetic chronic kidney disease: Secondary | ICD-10-CM | POA: Diagnosis not present

## 2024-07-26 DIAGNOSIS — R296 Repeated falls: Secondary | ICD-10-CM | POA: Diagnosis not present

## 2024-07-26 DIAGNOSIS — R599 Enlarged lymph nodes, unspecified: Secondary | ICD-10-CM | POA: Diagnosis not present

## 2024-07-26 DIAGNOSIS — R161 Splenomegaly, not elsewhere classified: Secondary | ICD-10-CM | POA: Diagnosis not present

## 2024-07-26 DIAGNOSIS — N189 Chronic kidney disease, unspecified: Secondary | ICD-10-CM | POA: Diagnosis not present

## 2024-07-26 DIAGNOSIS — R5383 Other fatigue: Secondary | ICD-10-CM | POA: Diagnosis not present

## 2024-07-26 DIAGNOSIS — L7633 Postprocedural seroma of skin and subcutaneous tissue following a dermatologic procedure: Secondary | ICD-10-CM | POA: Diagnosis not present

## 2024-07-26 DIAGNOSIS — Z87891 Personal history of nicotine dependence: Secondary | ICD-10-CM | POA: Diagnosis not present

## 2024-07-26 DIAGNOSIS — E785 Hyperlipidemia, unspecified: Secondary | ICD-10-CM | POA: Diagnosis not present

## 2024-07-26 DIAGNOSIS — I2510 Atherosclerotic heart disease of native coronary artery without angina pectoris: Secondary | ICD-10-CM | POA: Diagnosis not present

## 2024-07-26 DIAGNOSIS — J029 Acute pharyngitis, unspecified: Secondary | ICD-10-CM | POA: Diagnosis not present

## 2024-07-26 DIAGNOSIS — Z941 Heart transplant status: Secondary | ICD-10-CM | POA: Diagnosis not present

## 2024-07-26 DIAGNOSIS — Z885 Allergy status to narcotic agent status: Secondary | ICD-10-CM | POA: Diagnosis not present

## 2024-07-26 DIAGNOSIS — I129 Hypertensive chronic kidney disease with stage 1 through stage 4 chronic kidney disease, or unspecified chronic kidney disease: Secondary | ICD-10-CM | POA: Diagnosis not present

## 2024-07-27 NOTE — Telephone Encounter (Signed)
 Patient called the answering service to report that his JP drain in his arm was dislodged today and is no longer holding suction.  He reports that in the past day the output has been very minimal since the seroma was evacuated in clinic.  Patient states that he wishes to just remove the tube as it is not doing anything.  I notified the patient that he should report to the emergency department if the seroma returns but otherwise since he has a 2-week follow-up scheduled already he can call the clinic in the morning to have his follow-up moved up so that he can be reevaluated sooner.  Patient states he is going to call the clinic first thing in the morning to reschedule his follow-up.

## 2024-07-28 ENCOUNTER — Encounter (HOSPITAL_COMMUNITY): Payer: Self-pay

## 2024-07-28 ENCOUNTER — Emergency Department (HOSPITAL_COMMUNITY)
Admission: EM | Admit: 2024-07-28 | Discharge: 2024-07-28 | Disposition: A | Discharge status: Home / Self Care | Source: Ambulatory Visit | Attending: Emergency Medicine | Admitting: Emergency Medicine

## 2024-07-28 ENCOUNTER — Other Ambulatory Visit: Payer: Self-pay

## 2024-07-28 DIAGNOSIS — E119 Type 2 diabetes mellitus without complications: Secondary | ICD-10-CM | POA: Insufficient documentation

## 2024-07-28 DIAGNOSIS — Z79899 Other long term (current) drug therapy: Secondary | ICD-10-CM | POA: Insufficient documentation

## 2024-07-28 DIAGNOSIS — Z794 Long term (current) use of insulin: Secondary | ICD-10-CM | POA: Insufficient documentation

## 2024-07-28 DIAGNOSIS — Z4802 Encounter for removal of sutures: Secondary | ICD-10-CM | POA: Insufficient documentation

## 2024-07-28 DIAGNOSIS — I509 Heart failure, unspecified: Secondary | ICD-10-CM | POA: Diagnosis not present

## 2024-07-28 DIAGNOSIS — Z7982 Long term (current) use of aspirin: Secondary | ICD-10-CM | POA: Insufficient documentation

## 2024-07-28 DIAGNOSIS — Z7984 Long term (current) use of oral hypoglycemic drugs: Secondary | ICD-10-CM | POA: Diagnosis not present
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
## 2024-07-28 NOTE — Telephone Encounter (Signed)
 Copied from CRM [id number]. Topic: Care Management - Discuss Care Plan >> Jul 28, 2024  8:05 AM Reyes RAMAN wrote:   Erskin Darina Buff contacted the Communication Center requesting to speak with the care team of [facility] to discuss:  JP drain came out from lymph node removal site under arm.  Please contact Mr. Beadnell  at [phone].  Thank you,  Reyes DELENA [facility] Cancer Communication Center  [phone]

## 2024-07-28 NOTE — Discharge Instructions (Signed)
 You were evaluated in the emergency room for suture removal.  These were removed without complication.  There is no evidence of active infection.  Please follow-up with your surgeon for further evaluation.

## 2024-07-28 NOTE — Telephone Encounter (Addendum)
 [facility] Triage Note  Reason for call: Return call  Time for incident: 0809 - 1000  Last seen in clinic on 07/26/24 Next clinic visit scheduled for 08/09/24 Surgery performed on 07/11/24   Phone Assessment: Spoke with pt regarding call request.  JP drain became dislodged around 1800 last night, and then he pulled it the rest of the way out.  Output was around 8 mls in 24 hrs prior to it becoming dislodged.  At this time, the area is flat with no swelling.  Small amount of clear drainage - pt covered with clean gauze.  No warmth, increased tenderness, redness at site.  Pt will continue to monitor site, and will call back if he notices area swelling back up.  Let him know we will reach out ahead of follow up visit if provider would like to see him sooner.   Triage Recommendations: Msg forwarded to care team for recommendations.   Caller's Response: Thank you so much.   Outstanding tasks: Care team notified, no further action needed.  Provider Response: No - glad to hear no recurrence of fluid collection as of yet.  Happy to see him at any point. If no recurrence of seroma, or other concerns can cancel the 08/09/24 appointment.  Should follow up with medical oncology as planned.  Called pt back to relay provider response.  He will monitor.  Still has stitch where drain was.  He will see if nurse at local doctor can removed stitch since his eyesight in not good enough to do it.

## 2024-07-28 NOTE — ED Triage Notes (Signed)
 Pt arrived via POV c/o suture removal where Pt reports he recently pulled a JP Drain out. Pt reports he placed guaze over the insertion site and has had minimal drainage from the site.

## 2024-08-10 ENCOUNTER — Ambulatory Visit (INDEPENDENT_AMBULATORY_CARE_PROVIDER_SITE_OTHER): Admitting: Family Medicine

## 2024-08-10 ENCOUNTER — Encounter: Payer: Self-pay | Admitting: Family Medicine

## 2024-08-10 VITALS — BP 124/67 | HR 85 | Ht 67.0 in | Wt 129.0 lb

## 2024-08-10 DIAGNOSIS — Z7984 Long term (current) use of oral hypoglycemic drugs: Secondary | ICD-10-CM | POA: Diagnosis not present

## 2024-08-10 DIAGNOSIS — E119 Type 2 diabetes mellitus without complications: Secondary | ICD-10-CM

## 2024-08-10 DIAGNOSIS — E781 Pure hyperglyceridemia: Secondary | ICD-10-CM

## 2024-08-10 DIAGNOSIS — I152 Hypertension secondary to endocrine disorders: Secondary | ICD-10-CM

## 2024-08-10 DIAGNOSIS — Z794 Long term (current) use of insulin: Secondary | ICD-10-CM | POA: Diagnosis not present

## 2024-08-10 DIAGNOSIS — I10 Essential (primary) hypertension: Secondary | ICD-10-CM

## 2024-08-10 DIAGNOSIS — N1832 Chronic kidney disease, stage 3b: Secondary | ICD-10-CM | POA: Diagnosis not present

## 2024-08-10 DIAGNOSIS — N1831 Chronic kidney disease, stage 3a: Secondary | ICD-10-CM | POA: Diagnosis not present

## 2024-08-10 DIAGNOSIS — E1122 Type 2 diabetes mellitus with diabetic chronic kidney disease: Secondary | ICD-10-CM

## 2024-08-10 DIAGNOSIS — E1159 Type 2 diabetes mellitus with other circulatory complications: Secondary | ICD-10-CM

## 2024-08-10 LAB — CBC WITH DIFFERENTIAL/PLATELET
Basophils Absolute: 0 x10E3/uL (ref 0.0–0.2)
Basos: 1 %
EOS (ABSOLUTE): 0.1 x10E3/uL (ref 0.0–0.4)
Eos: 2 %
Hematocrit: 33.5 % — ABNORMAL LOW (ref 37.5–51.0)
Hemoglobin: 9.9 g/dL — ABNORMAL LOW (ref 13.0–17.7)
Immature Grans (Abs): 0 x10E3/uL (ref 0.0–0.1)
Immature Granulocytes: 0 %
Lymphocytes Absolute: 1.8 x10E3/uL (ref 0.7–3.1)
Lymphs: 42 %
MCH: 23.1 pg — ABNORMAL LOW (ref 26.6–33.0)
MCHC: 29.6 g/dL — ABNORMAL LOW (ref 31.5–35.7)
MCV: 78 fL — ABNORMAL LOW (ref 79–97)
Monocytes Absolute: 0.4 x10E3/uL (ref 0.1–0.9)
Monocytes: 10 %
Neutrophils Absolute: 2 x10E3/uL (ref 1.4–7.0)
Neutrophils: 45 %
Platelets: 136 x10E3/uL — ABNORMAL LOW (ref 150–450)
RBC: 4.28 x10E6/uL (ref 4.14–5.80)
RDW: 19.5 % — ABNORMAL HIGH (ref 11.6–15.4)
WBC: 4.3 x10E3/uL (ref 3.4–10.8)

## 2024-08-10 LAB — CMP14+EGFR
ALT: 7 IU/L (ref 0–44)
AST: 14 IU/L (ref 0–40)
Albumin: 4 g/dL (ref 3.9–4.9)
Alkaline Phosphatase: 67 IU/L (ref 47–123)
BUN/Creatinine Ratio: 16 (ref 10–24)
BUN: 29 mg/dL — ABNORMAL HIGH (ref 8–27)
Bilirubin Total: 0.3 mg/dL (ref 0.0–1.2)
CO2: 20 mmol/L (ref 20–29)
Calcium: 9.3 mg/dL (ref 8.6–10.2)
Chloride: 99 mmol/L (ref 96–106)
Creatinine, Ser: 1.8 mg/dL — ABNORMAL HIGH (ref 0.76–1.27)
Globulin, Total: 3.3 g/dL (ref 1.5–4.5)
Glucose: 219 mg/dL — ABNORMAL HIGH (ref 70–99)
Potassium: 4.8 mmol/L (ref 3.5–5.2)
Sodium: 131 mmol/L — ABNORMAL LOW (ref 134–144)
Total Protein: 7.3 g/dL (ref 6.0–8.5)
eGFR: 41 mL/min/1.73 — ABNORMAL LOW (ref >59)

## 2024-08-10 LAB — LIPID PANEL
Chol/HDL Ratio: 4.6 ratio (ref 0.0–5.0)
Cholesterol, Total: 115 mg/dL (ref 100–199)
HDL: 25 mg/dL — ABNORMAL LOW (ref >39)
LDL Chol Calc (NIH): 61 mg/dL (ref 0–99)
Triglycerides: 167 mg/dL — ABNORMAL HIGH (ref 0–149)
VLDL Cholesterol Cal: 29 mg/dL (ref 5–40)

## 2024-08-10 LAB — BAYER DCA HB A1C WAIVED: HB A1C (BAYER DCA - WAIVED): 6 % — ABNORMAL HIGH (ref 4.8–5.6)

## 2024-08-10 NOTE — Progress Notes (Addendum)
 BP 124/67   Pulse 85   Ht 5' 7 (1.702 m)   Wt 129 lb (58.5 kg)   SpO2 95%   BMI 20.20 kg/m    Subjective:   Patient ID: Anthony Price, male    DOB: [birth year], 67 y.o.   MRN: [id number]  HPI: Anthony Price is a 68 y.o. male presenting on 08/10/2024 for Medical Management of Chronic Issues, Diabetes, and Chronic Kidney Disease   Discussed the use of AI scribe software for clinical note transcription with the patient, who gave verbal consent to proceed.  History of Present Illness   Anthony Price is a 67 year old male with diabetes and a recent diagnosis of lymphoma who presents for a recheck and discussion of his condition.  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 following a CT scan during a hospital stay - Informed of abnormal findings by transplant coordinator and cardiologist approximately four months after initial scan - Underwent biopsy of lymph nodes under the arm; biopsy results took over a month to return - Received diagnosis of lymphoma last week - Scheduled for another CT scan  Constitutional symptoms - Decline in condition over the past six months - Loss of appetite - Difficulty with daily activities - Attributes some changes to side effects of long-term medication use  Diabetes mellitus - Managed with Farxiga , metformin , and Januvia  - No issues reported with current regimen - Most recent A1c is 6.0  Post-heart transplant status and immunosuppression - History of heart transplant - On CellCept for rejection prevention for 25 years - Recent reduction in CellCept dosage          Relevant past medical, surgical, family and social history reviewed and updated as indicated. Interim medical history since our last visit reviewed. Allergies and medications reviewed and updated.  Review of Systems  Constitutional:  Negative for chills and fever.  Eyes:  Negative for visual disturbance.   Respiratory:  Negative for shortness of breath and wheezing.   Cardiovascular:  Negative for chest pain and leg swelling.  Musculoskeletal:  Negative for back pain and gait problem.  Skin:  Negative for rash.  Neurological:  Negative for dizziness, weakness and light-headedness.  All other systems reviewed and are negative.   Per HPI unless specifically indicated above   Allergies as of 08/10/2024       Reactions   Lactose Intolerance (gi)    bloating   Tramadol Nausea Only        Medication List        Accurate as of August 10, 2024 11:12 AM. If you have any questions, ask your nurse or doctor.          Accu-Chek Aviva Plus test strip Generic drug: glucose blood Test once qd. DX E11.9   Accu-Chek Aviva Plus w/Device Kit Test qd. DX E11.9   Accu-Chek Softclix Lancets lancets Test once qd. DX E11.9   acetaminophen 500 MG tablet Commonly known as: TYLENOL Take 1,000 mg by mouth every 6 (six) hours as needed for moderate pain. Pain   allopurinol 300 MG tablet Commonly known as: ZYLOPRIM Take 300 mg by mouth daily.   ALPRAZolam  1 MG tablet Commonly known as: XANAX  TAKE (1) TABLET BY MOUTH THREE TIMES DAILY.   amLODipine  2.5 MG tablet Commonly known as: NORVASC  Take 1 tablet (2.5 mg total) by mouth daily.   aspirin EC 81 MG tablet Take 81 mg by mouth daily.   atorvastatin  40  MG tablet Commonly known as: LIPITOR Take 1 tablet (40 mg total) by mouth daily.   buPROPion  300 MG 24 hr tablet Commonly known as: WELLBUTRIN  XL TAKE 1 TABLET BY MOUTH ONCE EVERY MORNING.   carvedilol  12.5 MG tablet Commonly known as: COREG  TAKE (1) TABLET BY MOUTH TWICE DAILY.   cefadroxil  500 MG capsule Commonly known as: DURICEF Take 1 capsule (500 mg total) by mouth 2 (two) times daily.   dapagliflozin  propanediol 10 MG Tabs tablet Commonly known as: Farxiga  Take 1 tablet (10 mg total) by mouth daily before breakfast.   esomeprazole  20 MG capsule Commonly known  as: NEXIUM  TAKE (1) CAPSULE BY MOUTH TWICE DAILYBEFORE A MEAL   fenofibrate  48 MG tablet Commonly known as: TRICOR  Take 1 tablet (48 mg total) by mouth daily.   magnesium  30 MG tablet Take 30 mg by mouth 2 (two) times daily.   metFORMIN  500 MG tablet Commonly known as: GLUCOPHAGE  Take 1 tablet (500 mg total) by mouth 2 (two) times daily with a meal.   mupirocin  ointment 2 % Commonly known as: BACTROBAN  Apply 1 Application topically 2 (two) times daily.   mycophenolate 500 MG tablet Commonly known as: CELLCEPT Take 500-1,000 mg by mouth See admin instructions. Take 500 mg by mouth in the morning and 1000 mg at night   Omega-3 1000 MG Caps Take 1,000 mg by mouth in the morning and at bedtime.   PEN NEEDLES 31GX5/16 31G X 8 MM Misc 1 each by Does not apply route daily.   PROBIOTIC DAILY PO Take 1 capsule by mouth daily.   ramipril 2.5 MG capsule Commonly known as: ALTACE Take 2.5 mg by mouth daily.   sildenafil  20 MG tablet Commonly known as: REVATIO  Take 1-5 tablets (20-100 mg total) by mouth daily as needed.   sitaGLIPtin  50 MG tablet Commonly known as: Januvia  Take 1 tablet (50 mg total) by mouth daily.   tacrolimus  0.5 MG capsule Commonly known as: PROGRAF  Take 0.5 mg by mouth 2 (two) times daily.   Tresiba  FlexTouch 100 UNIT/ML FlexTouch Pen Generic drug: insulin  degludec Inject 9 Units into the skin daily. Inject 9 Units into the skin daily.   Vilazodone  HCl 10 MG Tabs Commonly known as: VIIBRYD  Take 1 tablet (10 mg total) by mouth daily.   Vilazodone  HCl 20 MG Tabs Commonly known as: Viibryd  Take 1 tablet (20 mg total) by mouth daily.         Objective:   BP 124/67   Pulse 85   Ht 5' 7 (1.702 m)   Wt 129 lb (58.5 kg)   SpO2 95%   BMI 20.20 kg/m   Wt Readings from Last 3 Encounters:  08/10/24 129 lb (58.5 kg)  07/28/24 136 lb 14.5 oz (62.1 kg)  05/09/24 137 lb (62.1 kg)    Physical Exam Physical Exam   VITALS: BP- 124/67 CHEST:  Lungs clear to auscultation. CARDIOVASCULAR: Regular heart rhythm, no murmurs. EXTREMITIES: No cuts or sores on extremities.         Assessment & Plan:   Problem List Items Addressed This Visit       Cardiovascular and Mediastinum   Hypertension associated with diabetes (HCC)     Endocrine   Diabetes type 2, controlled (HCC)     Genitourinary   CKD (chronic kidney disease), stage III (HCC)     Other   Hypertriglyceridemia   Relevant Orders   Bayer DCA Hb A1c Waived (Completed)   CBC with Differential/Platelet (Completed)  CMP14+EGFR (Completed)   Lipid panel (Completed)   Other Visit Diagnoses       Type 2 diabetes mellitus with stage 3a chronic kidney disease, without long-term current use of insulin  (HCC)    -  Primary   Relevant Orders   Bayer DCA Hb A1c Waived (Completed)   CBC with Differential/Platelet (Completed)   CMP14+EGFR (Completed)   Lipid panel (Completed)     Essential hypertension       Relevant Orders   Bayer DCA Hb A1c Waived (Completed)   CBC with Differential/Platelet (Completed)   CMP14+EGFR (Completed)   Lipid panel (Completed)          Lymphoma Recently diagnosed with lymphoma. Considered for clinical trial with a drug combination showing 30-35% success rate. Avoiding chemotherapy due to cardiac risks. Kidney function may affect trial eligibility. - Participate in clinical trial for lymphoma treatment. - Monitor kidney function closely. - Maintain adequate nutrition with emphasis on protein. - Ensure hydration to support kidney function.  Chronic kidney disease, stage 3a Stage 3a CKD with concerns about kidney function affecting lymphoma trial eligibility. - Encourage adequate hydration.  Type 2 diabetes mellitus Well-controlled with A1c of 6.0. Managed with Farxiga , metformin , and Januvia . - Continue Farxiga , metformin , and Januvia .  Essential hypertension Blood pressure well-controlled at 124/67.  Pure  hyperglyceridemia Managed with fish oil supplements. - Continue fish oil supplements.          Follow up plan: Return in about 3 months (around 11/10/2024), or if symptoms worsen or fail to improve, for Diabetes recheck.  Counseling provided for all of the vaccine components Orders Placed This Encounter  Procedures   Bayer DCA Hb A1c Waived   CBC with Differential/Platelet   CMP14+EGFR   Lipid panel    Fonda Levins, MD [facility] 08/10/2024, 11:12 AM

## 2024-08-11 DIAGNOSIS — Z941 Heart transplant status: Secondary | ICD-10-CM | POA: Diagnosis not present

## 2024-08-11 DIAGNOSIS — Z48288 Encounter for aftercare following multiple organ transplant: Secondary | ICD-10-CM | POA: Diagnosis not present

## 2024-08-17 ENCOUNTER — Ambulatory Visit: Payer: Self-pay | Admitting: Family Medicine

## 2024-08-23 ENCOUNTER — Other Ambulatory Visit: Payer: Self-pay | Admitting: Family Medicine

## 2024-08-23 DIAGNOSIS — E1122 Type 2 diabetes mellitus with diabetic chronic kidney disease: Secondary | ICD-10-CM

## 2024-09-02 ENCOUNTER — Telehealth (HOSPITAL_COMMUNITY): Admitting: Psychiatry

## 2024-09-02 ENCOUNTER — Encounter (HOSPITAL_COMMUNITY): Payer: Self-pay | Admitting: Psychiatry

## 2024-09-02 DIAGNOSIS — F321 Major depressive disorder, single episode, moderate: Secondary | ICD-10-CM

## 2024-09-02 DIAGNOSIS — F418 Other specified anxiety disorders: Secondary | ICD-10-CM | POA: Diagnosis not present

## 2024-09-02 MED ORDER — VILAZODONE HCL 10 MG PO TABS: 10.0000 mg | ORAL_TABLET | Freq: Every day | ORAL | 2 refills | Status: DC

## 2024-09-02 MED ORDER — VILAZODONE HCL 20 MG PO TABS: 20.0000 mg | ORAL_TABLET | Freq: Every day | ORAL | 2 refills | Status: AC

## 2024-09-02 MED ORDER — ALPRAZOLAM 1 MG PO TABS
ORAL_TABLET | ORAL | 2 refills | Status: AC
Start: 2024-09-02 — End: ?

## 2024-09-02 MED ORDER — BUPROPION HCL ER (XL) 300 MG PO TB24: ORAL_TABLET | ORAL | 2 refills | Status: AC
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
## 2024-09-07 NOTE — Progress Notes (Signed)
 Pt arrived to chair ambulatory in outpatient oncology infusion study tafasitamab/rituxan per orders. Pt accessed via PIV, with positive blood return. Treatment completed, no concerns. Pt discharged ambulatory. AVS declined.

## 2024-09-08 NOTE — Telephone Encounter (Signed)
 Discussed recent labs with [facility], CPP.  Plan is to Make No Changes with repeat labs in 1 Month.  Mr. Tester was not available for this phone call. Detailed voicemail message left requesting callback to verify that the he received the message.  Lab slip created and mailed out to the pt.  Lab Results  Component Value Date   TACROLIMUS  5.1 09/07/2024   SIROLIMUS  <1.0 (L) 09/17/2023   Goal: Tac: 3-5 Current Dose: Tacrolimus  0.5 mg in AM/ 0.5 mg in PM  Lab Results  Component Value Date   BUN 17 09/07/2024   CREATININE 1.99 (H) 09/07/2024   K 4.4 09/07/2024   GLU 202 (H) 09/07/2024   MG 2.0 09/07/2024   Lab Results  Component Value Date   WBC 7.7 09/07/2024   HGB 11.5 (L) 09/07/2024   HCT 36.5 (L) 09/07/2024   PLT 167 09/07/2024   NEUTROABS 4.2 09/07/2024   EOSABS 0.1 09/07/2024

## 2024-09-09 NOTE — Telephone Encounter (Signed)
 Reviewed DSA with Chrissy Doligalski, CPP. No action required. Negative DSA. Repeat in 1 year.

## 2024-09-11 ENCOUNTER — Emergency Department (HOSPITAL_COMMUNITY)

## 2024-09-11 ENCOUNTER — Other Ambulatory Visit: Payer: Self-pay

## 2024-09-11 ENCOUNTER — Encounter (HOSPITAL_COMMUNITY)
Admission: EM | Disposition: A | Discharge status: Home / Self Care | Payer: Self-pay | Source: Home / Self Care | Attending: Internal Medicine

## 2024-09-11 ENCOUNTER — Encounter (HOSPITAL_COMMUNITY): Payer: Self-pay | Admitting: *Deleted

## 2024-09-11 ENCOUNTER — Inpatient Hospital Stay (HOSPITAL_COMMUNITY): Admitting: Registered Nurse

## 2024-09-11 ENCOUNTER — Inpatient Hospital Stay (HOSPITAL_COMMUNITY)
Admission: EM | Admit: 2024-09-11 | Discharge: 2024-09-14 | DRG: 025 | Disposition: A | Discharge status: Home / Self Care | Attending: Emergency Medicine | Admitting: Emergency Medicine

## 2024-09-11 DIAGNOSIS — M549 Dorsalgia, unspecified: Secondary | ICD-10-CM | POA: Diagnosis present

## 2024-09-11 DIAGNOSIS — I255 Ischemic cardiomyopathy: Secondary | ICD-10-CM | POA: Diagnosis present

## 2024-09-11 DIAGNOSIS — T380X5A Adverse effect of glucocorticoids and synthetic analogues, initial encounter: Secondary | ICD-10-CM | POA: Diagnosis not present

## 2024-09-11 DIAGNOSIS — I10 Essential (primary) hypertension: Secondary | ICD-10-CM | POA: Diagnosis not present

## 2024-09-11 DIAGNOSIS — Z820 Family history of epilepsy and other diseases of the nervous system: Secondary | ICD-10-CM

## 2024-09-11 DIAGNOSIS — E739 Lactose intolerance, unspecified: Secondary | ICD-10-CM | POA: Diagnosis present

## 2024-09-11 DIAGNOSIS — F419 Anxiety disorder, unspecified: Secondary | ICD-10-CM | POA: Diagnosis present

## 2024-09-11 DIAGNOSIS — R4189 Other symptoms and signs involving cognitive functions and awareness: Secondary | ICD-10-CM | POA: Diagnosis present

## 2024-09-11 DIAGNOSIS — R4701 Aphasia: Secondary | ICD-10-CM | POA: Diagnosis present

## 2024-09-11 DIAGNOSIS — Z79891 Long term (current) use of opiate analgesic: Secondary | ICD-10-CM

## 2024-09-11 DIAGNOSIS — I130 Hypertensive heart and chronic kidney disease with heart failure and stage 1 through stage 4 chronic kidney disease, or unspecified chronic kidney disease: Secondary | ICD-10-CM | POA: Diagnosis present

## 2024-09-11 DIAGNOSIS — R4182 Altered mental status, unspecified: Secondary | ICD-10-CM | POA: Diagnosis present

## 2024-09-11 DIAGNOSIS — Z823 Family history of stroke: Secondary | ICD-10-CM

## 2024-09-11 DIAGNOSIS — Z7401 Bed confinement status: Secondary | ICD-10-CM | POA: Diagnosis not present

## 2024-09-11 DIAGNOSIS — Y92012 Bathroom of single-family (private) house as the place of occurrence of the external cause: Secondary | ICD-10-CM | POA: Diagnosis not present

## 2024-09-11 DIAGNOSIS — I110 Hypertensive heart disease with heart failure: Secondary | ICD-10-CM

## 2024-09-11 DIAGNOSIS — S065X0A Traumatic subdural hemorrhage without loss of consciousness, initial encounter: Principal | ICD-10-CM | POA: Diagnosis present

## 2024-09-11 DIAGNOSIS — E1122 Type 2 diabetes mellitus with diabetic chronic kidney disease: Secondary | ICD-10-CM | POA: Diagnosis present

## 2024-09-11 DIAGNOSIS — K219 Gastro-esophageal reflux disease without esophagitis: Secondary | ICD-10-CM | POA: Diagnosis present

## 2024-09-11 DIAGNOSIS — E1165 Type 2 diabetes mellitus with hyperglycemia: Secondary | ICD-10-CM | POA: Diagnosis not present

## 2024-09-11 DIAGNOSIS — Z87891 Personal history of nicotine dependence: Secondary | ICD-10-CM | POA: Diagnosis not present

## 2024-09-11 DIAGNOSIS — I6200 Nontraumatic subdural hemorrhage, unspecified: Secondary | ICD-10-CM | POA: Diagnosis not present

## 2024-09-11 DIAGNOSIS — S064X0A Epidural hemorrhage without loss of consciousness, initial encounter: Secondary | ICD-10-CM | POA: Diagnosis not present

## 2024-09-11 DIAGNOSIS — Z941 Heart transplant status: Secondary | ICD-10-CM

## 2024-09-11 DIAGNOSIS — I4510 Unspecified right bundle-branch block: Secondary | ICD-10-CM | POA: Diagnosis present

## 2024-09-11 DIAGNOSIS — N1832 Chronic kidney disease, stage 3b: Secondary | ICD-10-CM | POA: Diagnosis present

## 2024-09-11 DIAGNOSIS — I272 Pulmonary hypertension, unspecified: Secondary | ICD-10-CM

## 2024-09-11 DIAGNOSIS — E119 Type 2 diabetes mellitus without complications: Secondary | ICD-10-CM

## 2024-09-11 DIAGNOSIS — Z9889 Other specified postprocedural states: Secondary | ICD-10-CM

## 2024-09-11 DIAGNOSIS — Z79899 Other long term (current) drug therapy: Secondary | ICD-10-CM

## 2024-09-11 DIAGNOSIS — E7800 Pure hypercholesterolemia, unspecified: Secondary | ICD-10-CM | POA: Diagnosis present

## 2024-09-11 DIAGNOSIS — R410 Disorientation, unspecified: Secondary | ICD-10-CM

## 2024-09-11 DIAGNOSIS — Z8249 Family history of ischemic heart disease and other diseases of the circulatory system: Secondary | ICD-10-CM

## 2024-09-11 DIAGNOSIS — F32A Depression, unspecified: Secondary | ICD-10-CM | POA: Diagnosis present

## 2024-09-11 DIAGNOSIS — D47Z1 Post-transplant lymphoproliferative disorder (PTLD): Secondary | ICD-10-CM | POA: Diagnosis present

## 2024-09-11 DIAGNOSIS — G8929 Other chronic pain: Secondary | ICD-10-CM | POA: Diagnosis present

## 2024-09-11 DIAGNOSIS — S065XAA Traumatic subdural hemorrhage with loss of consciousness status unknown, initial encounter: Secondary | ICD-10-CM | POA: Diagnosis present

## 2024-09-11 DIAGNOSIS — Z79624 Long term (current) use of inhibitors of nucleotide synthesis: Secondary | ICD-10-CM

## 2024-09-11 DIAGNOSIS — Z794 Long term (current) use of insulin: Secondary | ICD-10-CM

## 2024-09-11 DIAGNOSIS — G935 Compression of brain: Secondary | ICD-10-CM

## 2024-09-11 DIAGNOSIS — I252 Old myocardial infarction: Secondary | ICD-10-CM | POA: Diagnosis not present

## 2024-09-11 DIAGNOSIS — T86298 Other complications of heart transplant: Secondary | ICD-10-CM | POA: Diagnosis present

## 2024-09-11 DIAGNOSIS — E785 Hyperlipidemia, unspecified: Secondary | ICD-10-CM

## 2024-09-11 DIAGNOSIS — I509 Heart failure, unspecified: Secondary | ICD-10-CM | POA: Diagnosis not present

## 2024-09-11 DIAGNOSIS — Z7960 Long term (current) use of unspecified immunomodulators and immunosuppressants: Secondary | ICD-10-CM

## 2024-09-11 DIAGNOSIS — M51369 Other intervertebral disc degeneration, lumbar region without mention of lumbar back pain or lower extremity pain: Secondary | ICD-10-CM | POA: Diagnosis present

## 2024-09-11 DIAGNOSIS — Y830 Surgical operation with transplant of whole organ as the cause of abnormal reaction of the patient, or of later complication, without mention of misadventure at the time of the procedure: Secondary | ICD-10-CM | POA: Diagnosis present

## 2024-09-11 DIAGNOSIS — W182XXA Fall in (into) shower or empty bathtub, initial encounter: Secondary | ICD-10-CM | POA: Diagnosis present

## 2024-09-11 DIAGNOSIS — Z7984 Long term (current) use of oral hypoglycemic drugs: Secondary | ICD-10-CM

## 2024-09-11 DIAGNOSIS — Z7982 Long term (current) use of aspirin: Secondary | ICD-10-CM

## 2024-09-11 DIAGNOSIS — Z743 Need for continuous supervision: Secondary | ICD-10-CM | POA: Diagnosis not present

## 2024-09-11 HISTORY — PX: CRANIOTOMY: SHX93

## 2024-09-11 LAB — COMPREHENSIVE METABOLIC PANEL WITH GFR
ALT: <5 U/L (ref 0–44)
AST: 15 U/L (ref 15–41)
Albumin: 4.5 g/dL (ref 3.5–5.0)
Alkaline Phosphatase: 60 U/L (ref 38–126)
Anion gap: 12 (ref 5–15)
BUN: 20 mg/dL (ref 8–23)
CO2: 23 mmol/L (ref 22–32)
Calcium: 9.4 mg/dL (ref 8.9–10.3)
Chloride: 101 mmol/L (ref 98–111)
Creatinine, Ser: 1.95 mg/dL — ABNORMAL HIGH (ref 0.61–1.24)
GFR, Estimated: 37 mL/min — ABNORMAL LOW (ref >60)
Glucose, Bld: 219 mg/dL — ABNORMAL HIGH (ref 70–99)
Potassium: 4.8 mmol/L (ref 3.5–5.1)
Sodium: 136 mmol/L (ref 135–145)
Total Bilirubin: 0.6 mg/dL (ref 0.0–1.2)
Total Protein: 7.8 g/dL (ref 6.5–8.1)

## 2024-09-11 LAB — CBC
HCT: 37.8 % — ABNORMAL LOW (ref 39.0–52.0)
HCT: 33.8 % — ABNORMAL LOW (ref 39.0–52.0)
Hemoglobin: 11.3 g/dL — ABNORMAL LOW (ref 13.0–17.0)
Hemoglobin: 10.2 g/dL — ABNORMAL LOW (ref 13.0–17.0)
MCH: 23.3 pg — ABNORMAL LOW (ref 26.0–34.0)
MCH: 23.1 pg — ABNORMAL LOW (ref 26.0–34.0)
MCHC: 29.9 g/dL — ABNORMAL LOW (ref 30.0–36.0)
MCHC: 30.2 g/dL (ref 30.0–36.0)
MCV: 77.9 fL — ABNORMAL LOW (ref 80.0–100.0)
MCV: 76.5 fL — ABNORMAL LOW (ref 80.0–100.0)
Platelets: 176 K/uL (ref 150–400)
Platelets: 191 K/uL (ref 150–400)
RBC: 4.85 MIL/uL (ref 4.22–5.81)
RBC: 4.42 MIL/uL (ref 4.22–5.81)
RDW: 17.4 % — ABNORMAL HIGH (ref 11.5–15.5)
RDW: 17.1 % — ABNORMAL HIGH (ref 11.5–15.5)
WBC: 7 K/uL (ref 4.0–10.5)
WBC: 6.2 K/uL (ref 4.0–10.5)
nRBC: 0 % (ref 0.0–0.2)
nRBC: 0 % (ref 0.0–0.2)

## 2024-09-11 LAB — GLUCOSE, CAPILLARY
Glucose-Capillary: 155 mg/dL — ABNORMAL HIGH (ref 70–99)
Glucose-Capillary: 159 mg/dL — ABNORMAL HIGH (ref 70–99)
Glucose-Capillary: 204 mg/dL — ABNORMAL HIGH (ref 70–99)
Glucose-Capillary: 295 mg/dL — ABNORMAL HIGH (ref 70–99)

## 2024-09-11 LAB — DIFFERENTIAL
Abs Immature Granulocytes: 0.02 K/uL (ref 0.00–0.07)
Basophils Absolute: 0.1 K/uL (ref 0.0–0.1)
Basophils Relative: 1 %
Eosinophils Absolute: 0.1 K/uL (ref 0.0–0.5)
Eosinophils Relative: 1 %
Immature Granulocytes: 0 %
Lymphocytes Relative: 22 %
Lymphs Abs: 1.5 K/uL (ref 0.7–4.0)
Monocytes Absolute: 0.6 K/uL (ref 0.1–1.0)
Monocytes Relative: 9 %
Neutro Abs: 4.7 K/uL (ref 1.7–7.7)
Neutrophils Relative %: 67 %

## 2024-09-11 LAB — CBG MONITORING, ED: Glucose-Capillary: 204 mg/dL — ABNORMAL HIGH (ref 70–99)

## 2024-09-11 LAB — TYPE AND SCREEN
ABO/RH(D): B POS
Antibody Screen: NEGATIVE

## 2024-09-11 LAB — MRSA NEXT GEN BY PCR, NASAL: MRSA by PCR Next Gen: NOT DETECTED

## 2024-09-11 LAB — PROTIME-INR
INR: 1.1 (ref 0.8–1.2)
Prothrombin Time: 14.7 s (ref 11.4–15.2)

## 2024-09-11 LAB — APTT: aPTT: 26 s (ref 24–36)

## 2024-09-11 LAB — ETHANOL: Alcohol, Ethyl (B): <15 mg/dL (ref <15)

## 2024-09-11 LAB — ABO/RH: ABO/RH(D): B POS

## 2024-09-11 SURGERY — CRANIOTOMY HEMATOMA EVACUATION SUBDURAL
Anesthesia: General | Site: Head | Laterality: Right

## 2024-09-11 MED ORDER — FENOFIBRATE 54 MG PO TABS
54.0000 mg | ORAL_TABLET | Freq: Every day | ORAL | Status: DC
  Administered 2024-09-12 – 2024-09-14 (×3): 54 mg via ORAL
  Filled 2024-09-11 (×3): qty 1

## 2024-09-11 MED ORDER — HYDRALAZINE HCL 20 MG/ML IJ SOLN: 5.0000 mg | INTRAMUSCULAR | Status: DC | PRN

## 2024-09-11 MED ORDER — PROPOFOL 10 MG/ML IV BOLUS
INTRAVENOUS | Status: DC | PRN
Start: 2024-09-11 — End: 2024-09-11
  Administered 2024-09-11: 50 mg via INTRAVENOUS
  Administered 2024-09-11: 150 mg via INTRAVENOUS

## 2024-09-11 MED ORDER — LABETALOL HCL 5 MG/ML IV SOLN: 20.0000 mg | INTRAVENOUS | Status: DC | PRN

## 2024-09-11 MED ORDER — THROMBIN 5000 UNITS EX KIT
PACK | CUTANEOUS | Status: AC
  Filled 2024-09-11: qty 1

## 2024-09-11 MED ORDER — DEXAMETHASONE SODIUM PHOSPHATE 4 MG/ML IJ SOLN
4.0000 mg | Freq: Four times a day (QID) | INTRAMUSCULAR | Status: AC
  Administered 2024-09-13 (×4): 4 mg via INTRAVENOUS
  Filled 2024-09-11 (×4): qty 1

## 2024-09-11 MED ORDER — ONDANSETRON HCL 4 MG/2ML IJ SOLN
INTRAMUSCULAR | Status: DC | PRN
  Administered 2024-09-11: 4 mg via INTRAVENOUS

## 2024-09-11 MED ORDER — CEFAZOLIN SODIUM-DEXTROSE 2-3 GM-%(50ML) IV SOLR
INTRAVENOUS | Status: DC | PRN
  Administered 2024-09-11: 2 g via INTRAVENOUS

## 2024-09-11 MED ORDER — ONDANSETRON HCL 4 MG PO TABS: 4.0000 mg | ORAL_TABLET | ORAL | Status: DC | PRN

## 2024-09-11 MED ORDER — LORAZEPAM 2 MG/ML IJ SOLN
1.0000 mg | Freq: Three times a day (TID) | INTRAMUSCULAR | Status: DC
  Administered 2024-09-11: 1 mg via INTRAVENOUS
  Filled 2024-09-11: qty 1

## 2024-09-11 MED ORDER — POLYETHYLENE GLYCOL 3350 17 G PO PACK: 17.0000 g | PACK | Freq: Every day | ORAL | Status: DC | PRN

## 2024-09-11 MED ORDER — HYDROCODONE-ACETAMINOPHEN 5-325 MG PO TABS
1.0000 | ORAL_TABLET | ORAL | Status: DC | PRN
  Administered 2024-09-12: 1 via ORAL
  Filled 2024-09-11: qty 1

## 2024-09-11 MED ORDER — ROCURONIUM BROMIDE 10 MG/ML (PF) SYRINGE
PREFILLED_SYRINGE | INTRAVENOUS | Status: DC | PRN
  Administered 2024-09-11: 50 mg via INTRAVENOUS

## 2024-09-11 MED ORDER — FENTANYL CITRATE (PF) 100 MCG/2ML IJ SOLN: 25.0000 ug | INTRAMUSCULAR | Status: DC | PRN

## 2024-09-11 MED ORDER — CLEVIDIPINE BUTYRATE 0.5 MG/ML IV EMUL: 2.0000 mg/h | INTRAVENOUS | Status: DC

## 2024-09-11 MED ORDER — THROMBIN 5000 UNITS EX SOLR: OROMUCOSAL | Status: DC | PRN

## 2024-09-11 MED ORDER — ALLOPURINOL 300 MG PO TABS
300.0000 mg | ORAL_TABLET | Freq: Every day | ORAL | Status: DC
  Administered 2024-09-12 – 2024-09-14 (×3): 300 mg via ORAL
  Filled 2024-09-11 (×3): qty 1

## 2024-09-11 MED ORDER — FENTANYL CITRATE (PF) 250 MCG/5ML IJ SOLN
INTRAMUSCULAR | Status: AC
  Filled 2024-09-11: qty 5

## 2024-09-11 MED ORDER — MORPHINE SULFATE (PF) 2 MG/ML IV SOLN: 1.0000 mg | INTRAVENOUS | Status: DC | PRN

## 2024-09-11 MED ORDER — BUPROPION HCL ER (XL) 300 MG PO TB24
300.0000 mg | ORAL_TABLET | Freq: Every day | ORAL | Status: DC
  Administered 2024-09-12 – 2024-09-14 (×3): 300 mg via ORAL
  Filled 2024-09-11 (×3): qty 1

## 2024-09-11 MED ORDER — DAPAGLIFLOZIN PROPANEDIOL 10 MG PO TABS
10.0000 mg | ORAL_TABLET | Freq: Every day | ORAL | Status: DC
Start: 2024-09-12 — End: 2024-09-12
  Administered 2024-09-12: 10 mg via ORAL
  Filled 2024-09-11: qty 1

## 2024-09-11 MED ORDER — ONDANSETRON HCL 4 MG/2ML IJ SOLN: 4.0000 mg | INTRAMUSCULAR | Status: DC | PRN

## 2024-09-11 MED ORDER — LEVETIRACETAM (KEPPRA) 500 MG/5 ML ADULT IV PUSH
500.0000 mg | Freq: Two times a day (BID) | INTRAVENOUS | Status: DC
  Administered 2024-09-11: 500 mg via INTRAVENOUS
  Filled 2024-09-11: qty 5

## 2024-09-11 MED ORDER — HEMOSTATIC AGENTS (NO CHARGE) OPTIME
TOPICAL | Status: DC | PRN
  Administered 2024-09-11: 1 via TOPICAL

## 2024-09-11 MED ORDER — TACROLIMUS 0.5 MG PO CAPS
0.5000 mg | ORAL_CAPSULE | Freq: Two times a day (BID) | ORAL | Status: DC
  Administered 2024-09-11 – 2024-09-14 (×6): 0.5 mg via ORAL
  Filled 2024-09-11 (×9): qty 1

## 2024-09-11 MED ORDER — SODIUM CHLORIDE 0.9 % IV SOLN: INTRAVENOUS | Status: DC | PRN

## 2024-09-11 MED ORDER — THROMBIN 20000 UNITS EX SOLR
CUTANEOUS | Status: AC
  Filled 2024-09-11: qty 20000

## 2024-09-11 MED ORDER — EPHEDRINE SULFATE-NACL 50-0.9 MG/10ML-% IV SOSY: PREFILLED_SYRINGE | INTRAVENOUS | Status: DC | PRN

## 2024-09-11 MED ORDER — METFORMIN HCL 500 MG PO TABS: 500.0000 mg | ORAL_TABLET | Freq: Two times a day (BID) | ORAL | Status: DC

## 2024-09-11 MED ORDER — CEFADROXIL 500 MG PO CAPS: 500.0000 mg | ORAL_CAPSULE | Freq: Two times a day (BID) | ORAL | Status: DC

## 2024-09-11 MED ORDER — DOCUSATE SODIUM 100 MG PO CAPS: 100.0000 mg | ORAL_CAPSULE | Freq: Two times a day (BID) | ORAL | Status: DC | PRN

## 2024-09-11 MED ORDER — VILAZODONE HCL 20 MG PO TABS
20.0000 mg | ORAL_TABLET | Freq: Every day | ORAL | Status: DC
  Administered 2024-09-12 – 2024-09-14 (×3): 20 mg via ORAL
  Filled 2024-09-11 (×3): qty 1

## 2024-09-11 MED ORDER — LABETALOL HCL 5 MG/ML IV SOLN: 10.0000 mg | INTRAVENOUS | Status: DC | PRN

## 2024-09-11 MED ORDER — IOHEXOL 350 MG/ML SOLN
60.0000 mL | Freq: Once | INTRAVENOUS | Status: AC | PRN
  Administered 2024-09-11: 60 mL via INTRAVENOUS

## 2024-09-11 MED ORDER — ACETAMINOPHEN 10 MG/ML IV SOLN: 1000.0000 mg | Freq: Once | INTRAVENOUS | Status: DC | PRN

## 2024-09-11 MED ORDER — BACITRACIN ZINC 500 UNIT/GM EX OINT
TOPICAL_OINTMENT | CUTANEOUS | Status: DC | PRN
  Administered 2024-09-11: 1 via TOPICAL

## 2024-09-11 MED ORDER — 0.9 % SODIUM CHLORIDE (POUR BTL) OPTIME
TOPICAL | Status: DC | PRN
  Administered 2024-09-11 (×3): 1000 mL

## 2024-09-11 MED ORDER — BACITRACIN ZINC 500 UNIT/GM EX OINT
TOPICAL_OINTMENT | CUTANEOUS | Status: AC
  Filled 2024-09-11: qty 28.35

## 2024-09-11 MED ORDER — ATORVASTATIN CALCIUM 40 MG PO TABS
40.0000 mg | ORAL_TABLET | Freq: Every day | ORAL | Status: DC
  Administered 2024-09-12 – 2024-09-14 (×3): 40 mg via ORAL
  Filled 2024-09-11 (×3): qty 1

## 2024-09-11 MED ORDER — LABETALOL HCL 5 MG/ML IV SOLN
INTRAVENOUS | Status: DC | PRN
  Administered 2024-09-11 (×2): 5 mg via INTRAVENOUS

## 2024-09-11 MED ORDER — ORAL CARE MOUTH RINSE: 15.0000 mL | OROMUCOSAL | Status: DC | PRN

## 2024-09-11 MED ORDER — POTASSIUM CHLORIDE IN NACL 20-0.9 MEQ/L-% IV SOLN
INTRAVENOUS | Status: DC
  Filled 2024-09-11 (×2): qty 1000

## 2024-09-11 MED ORDER — DEXAMETHASONE SOD PHOSPHATE PF 10 MG/ML IJ SOLN
INTRAMUSCULAR | Status: DC | PRN
  Administered 2024-09-11: 10 mg via INTRAVENOUS

## 2024-09-11 MED ORDER — INSULIN ASPART 100 UNIT/ML IJ SOLN
0.0000 [IU] | INTRAMUSCULAR | Status: DC
  Administered 2024-09-11: 5 [IU] via SUBCUTANEOUS
  Administered 2024-09-11: 8 [IU] via SUBCUTANEOUS
  Administered 2024-09-12: 5 [IU] via SUBCUTANEOUS
  Filled 2024-09-11 (×2): qty 5
  Filled 2024-09-11: qty 8

## 2024-09-11 MED ORDER — CHLORHEXIDINE GLUCONATE CLOTH 2 % EX PADS
6.0000 | MEDICATED_PAD | Freq: Every day | CUTANEOUS | Status: DC
  Administered 2024-09-11 – 2024-09-12 (×2): 6 via TOPICAL

## 2024-09-11 MED ORDER — AMLODIPINE BESYLATE 2.5 MG PO TABS
2.5000 mg | ORAL_TABLET | Freq: Every day | ORAL | Status: DC
  Administered 2024-09-12 – 2024-09-13 (×2): 2.5 mg via ORAL
  Filled 2024-09-11 (×2): qty 1

## 2024-09-11 MED ORDER — LINAGLIPTIN 5 MG PO TABS
5.0000 mg | ORAL_TABLET | Freq: Every day | ORAL | Status: DC
  Filled 2024-09-11: qty 1

## 2024-09-11 MED ORDER — ACETAMINOPHEN 650 MG RE SUPP
650.0000 mg | RECTAL | Status: DC | PRN
Start: 2024-09-11 — End: 2024-09-14

## 2024-09-11 MED ORDER — MAGNESIUM OXIDE -MG SUPPLEMENT 400 (240 MG) MG PO TABS
400.0000 mg | ORAL_TABLET | Freq: Every day | ORAL | Status: DC
  Administered 2024-09-12 – 2024-09-14 (×3): 400 mg via ORAL
  Filled 2024-09-11 (×3): qty 1

## 2024-09-11 MED ORDER — CARVEDILOL 12.5 MG PO TABS
12.5000 mg | ORAL_TABLET | Freq: Two times a day (BID) | ORAL | Status: DC
  Administered 2024-09-12 – 2024-09-14 (×5): 12.5 mg via ORAL
  Filled 2024-09-11 (×5): qty 1

## 2024-09-11 MED ORDER — DEXAMETHASONE SOD PHOSPHATE PF 10 MG/ML IJ SOLN
6.0000 mg | Freq: Four times a day (QID) | INTRAMUSCULAR | Status: AC
  Administered 2024-09-11 – 2024-09-12 (×4): 6 mg via INTRAVENOUS

## 2024-09-11 MED ORDER — CHLORHEXIDINE GLUCONATE 0.12 % MT SOLN
15.0000 mL | Freq: Once | OROMUCOSAL | Status: AC
  Administered 2024-09-11: 15 mL via OROMUCOSAL

## 2024-09-11 MED ORDER — ORAL CARE MOUTH RINSE: 15.0000 mL | Freq: Once | OROMUCOSAL | Status: AC

## 2024-09-11 MED ORDER — CEFAZOLIN SODIUM-DEXTROSE 2-4 GM/100ML-% IV SOLN
INTRAVENOUS | Status: AC
  Filled 2024-09-11: qty 100

## 2024-09-11 MED ORDER — LACTATED RINGERS IV SOLN: INTRAVENOUS | Status: DC

## 2024-09-11 MED ORDER — ACETAMINOPHEN 325 MG PO TABS: 650.0000 mg | ORAL_TABLET | ORAL | Status: DC | PRN

## 2024-09-11 MED ORDER — MYCOPHENOLATE MOFETIL HCL 500 MG IV SOLR
250.0000 mg | Freq: Two times a day (BID) | INTRAVENOUS | Status: DC
  Administered 2024-09-11 – 2024-09-12 (×2): 250 mg via INTRAVENOUS
  Filled 2024-09-11 (×4): qty 7.5

## 2024-09-11 MED ORDER — FENTANYL CITRATE (PF) 250 MCG/5ML IJ SOLN
INTRAMUSCULAR | Status: DC | PRN
  Administered 2024-09-11 (×2): 50 ug via INTRAVENOUS

## 2024-09-11 MED ORDER — LIDOCAINE-EPINEPHRINE 1 %-1:100000 IJ SOLN
INTRAMUSCULAR | Status: AC
  Filled 2024-09-11: qty 1

## 2024-09-11 MED ORDER — INSULIN ASPART 100 UNIT/ML IJ SOLN: 0.0000 [IU] | Freq: Three times a day (TID) | INTRAMUSCULAR | Status: DC

## 2024-09-11 MED ORDER — THROMBIN 20000 UNITS EX SOLR: CUTANEOUS | Status: DC | PRN

## 2024-09-11 MED ORDER — SENNA 8.6 MG PO TABS
1.0000 | ORAL_TABLET | Freq: Two times a day (BID) | ORAL | Status: DC
  Administered 2024-09-11 – 2024-09-14 (×6): 8.6 mg via ORAL
  Filled 2024-09-11 (×6): qty 1

## 2024-09-11 MED ORDER — CHLORHEXIDINE GLUCONATE CLOTH 2 % EX PADS
6.0000 | MEDICATED_PAD | Freq: Every day | CUTANEOUS | Status: DC
  Administered 2024-09-13 – 2024-09-14 (×2): 6 via TOPICAL

## 2024-09-11 MED ORDER — VILAZODONE HCL 10 MG PO TABS: 10.0000 mg | ORAL_TABLET | Freq: Every day | ORAL | Status: DC

## 2024-09-11 MED ORDER — ACETAMINOPHEN 10 MG/ML IV SOLN
INTRAVENOUS | Status: AC
  Filled 2024-09-11: qty 100

## 2024-09-11 MED ORDER — MYCOPHENOLATE MOFETIL 250 MG PO CAPS
250.0000 mg | ORAL_CAPSULE | Freq: Two times a day (BID) | ORAL | Status: DC
  Administered 2024-09-12 – 2024-09-14 (×4): 250 mg via ORAL
  Filled 2024-09-11 (×7): qty 1

## 2024-09-11 MED ORDER — DEXAMETHASONE SODIUM PHOSPHATE 4 MG/ML IJ SOLN
4.0000 mg | Freq: Three times a day (TID) | INTRAMUSCULAR | Status: DC
  Administered 2024-09-14 (×3): 4 mg via INTRAVENOUS
  Filled 2024-09-11 (×3): qty 1

## 2024-09-11 MED ORDER — CLEVIDIPINE BUTYRATE 0.5 MG/ML IV EMUL
INTRAVENOUS | Status: AC
  Filled 2024-09-11: qty 100

## 2024-09-11 MED ORDER — SODIUM CHLORIDE 0.9 % IV SOLN
INTRAVENOUS | Status: DC | PRN
  Administered 2024-09-11: 500 mg via INTRAVENOUS

## 2024-09-11 MED ORDER — CHLORHEXIDINE GLUCONATE 0.12 % MT SOLN
OROMUCOSAL | Status: AC
Start: 2024-09-11 — End: 2024-09-11
  Filled 2024-09-11: qty 15

## 2024-09-11 MED ORDER — ACETAMINOPHEN 10 MG/ML IV SOLN
INTRAVENOUS | Status: DC | PRN
  Administered 2024-09-11: 1000 mg via INTRAVENOUS

## 2024-09-11 MED ORDER — LIDOCAINE-EPINEPHRINE 1 %-1:100000 IJ SOLN
INTRAMUSCULAR | Status: DC | PRN
  Administered 2024-09-11: 10 mL via INTRADERMAL

## 2024-09-11 MED ORDER — LIDOCAINE 2% (20 MG/ML) 5 ML SYRINGE
INTRAMUSCULAR | Status: DC | PRN
  Administered 2024-09-11: 40 mg via INTRAVENOUS

## 2024-09-11 MED ORDER — CLEVIDIPINE BUTYRATE 0.5 MG/ML IV EMUL
0.0000 mg/h | INTRAVENOUS | Status: DC
  Administered 2024-09-11: 2 mg/h via INTRAVENOUS

## 2024-09-11 MED ORDER — RAMIPRIL 1.25 MG PO CAPS
2.5000 mg | ORAL_CAPSULE | Freq: Every day | ORAL | Status: DC
  Administered 2024-09-12 – 2024-09-13 (×2): 2.5 mg via ORAL
  Filled 2024-09-11 (×3): qty 2

## 2024-09-11 MED ORDER — PANTOPRAZOLE SODIUM 40 MG PO TBEC
40.0000 mg | DELAYED_RELEASE_TABLET | Freq: Every day | ORAL | Status: DC
  Administered 2024-09-12 – 2024-09-14 (×3): 40 mg via ORAL
  Filled 2024-09-11 (×3): qty 1

## 2024-09-11 SURGICAL SUPPLY — 47 items
BAG COUNTER SPONGE SURGICOUNT (BAG) ×1 IMPLANT
BUR SPIRAL ROUTER 2.3 (BUR) ×1 IMPLANT
CANISTER SUCTION 3000ML PPV (SUCTIONS) ×1 IMPLANT
CLIP TI MEDIUM 6 (CLIP) IMPLANT
DRAPE MICROSCOPE LEICA (MISCELLANEOUS) IMPLANT
DRAPE NEUROLOGICAL W/INCISE (DRAPES) ×1 IMPLANT
DRAPE SURG 17X23 STRL (DRAPES) IMPLANT
DRAPE WARM FLUID 44X44 (DRAPES) ×1 IMPLANT
DURAPREP 6ML APPLICATOR 50/CS (WOUND CARE) ×1 IMPLANT
ELECTRODE REM PT RTRN 9FT ADLT (ELECTROSURGICAL) ×1 IMPLANT
EVACUATOR 1/8 PVC DRAIN (DRAIN) IMPLANT
GAUZE 4X4 16PLY ~~LOC~~+RFID DBL (SPONGE) IMPLANT
GAUZE SPONGE 4X4 12PLY STRL (GAUZE/BANDAGES/DRESSINGS) ×1 IMPLANT
GLOVE BIO SURGEON STRL SZ7 (GLOVE) ×1 IMPLANT
GLOVE BIO SURGEON STRL SZ8 (GLOVE) ×1 IMPLANT
GLOVE BIOGEL PI IND STRL 7.0 (GLOVE) ×1 IMPLANT
GOWN STRL REUS W/ TWL LRG LVL3 (GOWN DISPOSABLE) ×1 IMPLANT
GOWN STRL REUS W/ TWL XL LVL3 (GOWN DISPOSABLE) ×1 IMPLANT
GOWN STRL REUS W/TWL 2XL LVL3 (GOWN DISPOSABLE) IMPLANT
HEMOSTAT POWDER KIT SURGIFOAM (HEMOSTASIS) ×1 IMPLANT
KIT BASIN OR (CUSTOM PROCEDURE TRAY) ×1 IMPLANT
KIT TURNOVER KIT B (KITS) ×1 IMPLANT
NDL HYPO 22X1.5 SAFETY MO (MISCELLANEOUS) ×1 IMPLANT
NEEDLE HYPO 22X1.5 SAFETY MO (MISCELLANEOUS) ×1 IMPLANT
PACK BATTERY CMF DISP FOR DVR (ORTHOPEDIC DISPOSABLE SUPPLIES) IMPLANT
PACK CRANIOTOMY CUSTOM (CUSTOM PROCEDURE TRAY) ×1 IMPLANT
PAD ARMBOARD POSITIONER FOAM (MISCELLANEOUS) ×1 IMPLANT
PATTIES SURGICAL .5 X3 (DISPOSABLE) IMPLANT
PERFORATOR LRG 14-11MM (BIT) ×1 IMPLANT
PIN MAYFIELD SKULL DISP (PIN) IMPLANT
PLATE BONE 12 2H TARGET XL (Plate) IMPLANT
SCREW UNIII AXS SD 1.5X4 (Screw) IMPLANT
SOLN 0.9% NACL POUR BTL 1000ML (IV SOLUTION) ×1 IMPLANT
SOLN STERILE WATER BTL 1000 ML (IV SOLUTION) ×1 IMPLANT
SPONGE NEURO XRAY DETECT 1X3 (DISPOSABLE) IMPLANT
SPONGE SURGIFOAM ABS GEL 100 (HEMOSTASIS) ×1 IMPLANT
STAPLER SKIN PROX 35W (STAPLE) ×1 IMPLANT
SUT 3-0 BLK 1X30 PSL (SUTURE) IMPLANT
SUT NURALON 4 0 TR CR/8 (SUTURE) ×2 IMPLANT
SUT VIC AB 2-0 CP2 18 (SUTURE) ×1 IMPLANT
SYR CONTROL 10ML LL (SYRINGE) ×1 IMPLANT
TAPE CLOTH SURG 4X10 WHT LF (GAUZE/BANDAGES/DRESSINGS) IMPLANT
TOWEL GREEN STERILE (TOWEL DISPOSABLE) ×1 IMPLANT
TOWEL GREEN STERILE FF (TOWEL DISPOSABLE) ×1 IMPLANT
TRAY FOLEY MTR SLVR 16FR STAT (SET/KITS/TRAYS/PACK) ×1 IMPLANT
TUBING FEATHERFLOW (TUBING) IMPLANT
UNDERPAD 30X36 HEAVY ABSORB (UNDERPADS AND DIAPERS) IMPLANT

## 2024-09-11 NOTE — ED Triage Notes (Signed)
 Pt not talking but will answer questions with a yes or no head nod

## 2024-09-11 NOTE — Op Note (Signed)
 09/11/2024  3:14 PM  PATIENT:  Anthony Price  68 y.o. male  PRE-OPERATIVE DIAGNOSIS: Subacute right subdural hematoma  POST-OPERATIVE DIAGNOSIS:  same  PROCEDURE: Right frontotemporoparietal craniotomy for evacuation of subdural hematoma  SURGEON:  Alm Molt, MD  ASSISTANTS: Suzen Pean, FNP  ANESTHESIA:   General  EBL: 100 ml  Total I/O In: 150 [IV Piggyback:150] Out: 400 [Urine:400]  BLOOD ADMINISTERED: none  DRAINS: 7 flat JP subdural drain  SPECIMEN:  none  INDICATION FOR PROCEDURE: This patient presented with difficulty with speech. Imaging showed a large right subdural hematoma with mass effect and 1 cm of midline shift. Recommended right craniotomy for subdural hematoma. Patient understood the risks, benefits, and alternatives and potential outcomes and wished to proceed.  PROCEDURE DETAILS: The patient was taken to the operating room and after induction of adequate generalized endotracheal anesthesia, the head was affixed in a 3 point Mayfield head rest, and turned to the left to expose the right frontotemporal parietal region. The head was shaved and then cleaned and then prepped with DuraPrep and draped in the usual sterile fashion. 10 cc of local anesthetic was injected, and a lazy S incision was made on the right of the head. Raney clips were placed to establish hemostasis of the scalp, the muscle was reflected with the scalp flap, to expose the right frontotemporal parietal region. A burr hole was placed inferior to the superior temporal line, and a craniotomy flap was turned utilizing the high-speed, air powered drill. The flap was then placed in bacitracin-containing saline solution, and the dura was opened to expose a large subacute subdural hematoma on the right. A hematoma was then removed with a combination of irrigation and suction. I continued to irrigate until the irrigant was clear to, and dried any bleeding with bipolar cautery. I then placed a  subdural drain through separate stab incision and close the dura with a running 4-0 Nurolon suture. Dural tack up sutures were placed. The dura was lined with Gelfoam, and the craniotomy flap was replaced with doggie-bone plates. The wound was copiously irrigated.  the galea was then closed with interrupted 2-0 Vicryl suture. The skin was then closed with staples a sterile dressing was applied. The patient was then taken out of the 3-point Mayfield headrest and awakened from general anesthesia, and transported to the recovery room in stable condition. At the end of the procedure all sponge, needle, and instrument counts were correct.    PLAN OF CARE: Admit to inpatient   PATIENT DISPOSITION:  PACU - hemodynamically stable.   Delay start of Pharmacological VTE agent (>24hrs) due to surgical blood loss or risk of bleeding:  yes

## 2024-09-11 NOTE — Anesthesia Preprocedure Evaluation (Addendum)
 Anesthesia Evaluation  Patient identified by MRN, date of birth, ID band Patient awake    Reviewed: Allergy & Precautions, NPO status , Patient's Chart, lab work & pertinent test results, Unable to perform ROS - Chart review onlyPreop documentation limited or incomplete due to emergent nature of procedure.  History of Anesthesia Complications (+) PONV and history of anesthetic complications  Airway Mallampati: I  TM Distance: >3 FB Neck ROM: Full    Dental  (+) Edentulous Upper, Edentulous Lower   Pulmonary former smoker   breath sounds clear to auscultation       Cardiovascular hypertension, Pt. on home beta blockers and Pt. on medications + Past MI and +CHF   Rhythm:Regular Rate:Normal  Echo:    1. Status post heart transplant.    2. The left ventricular systolic function is normal, LVEF is visually  estimated at 55-60%.    3. There is mild aortic regurgitation.    4. The right ventricle is normal in size, with normal systolic function.     Neuro/Psych  PSYCHIATRIC DISORDERS Anxiety Depression       GI/Hepatic Neg liver ROS,GERD  Medicated,,  Endo/Other  diabetes, Type 2, Insulin  Dependent, Oral Hypoglycemic Agents    Renal/GU Renal disease     Musculoskeletal negative musculoskeletal ROS (+)    Abdominal   Peds  Hematology negative hematology ROS (+)   Anesthesia Other Findings   Reproductive/Obstetrics                              Anesthesia Physical Anesthesia Plan  ASA: 4  Anesthesia Plan: General   Post-op Pain Management: Ofirmev IV (intra-op)*   Induction: Intravenous  PONV Risk Score and Plan: 4 or greater and Ondansetron , Dexamethasone, Midazolam  and Scopolamine patch - Pre-op  Airway Management Planned: Oral ETT  Additional Equipment: Arterial line  Intra-op Plan:   Post-operative Plan: Possible Post-op intubation/ventilation  Informed Consent: I have  reviewed the patients History and Physical, chart, labs and discussed the procedure including the risks, benefits and alternatives for the proposed anesthesia with the patient or authorized representative who has indicated his/her understanding and acceptance.     Consent reviewed with POA  Plan Discussed with: CRNA  Anesthesia Plan Comments:          Anesthesia Quick Evaluation

## 2024-09-11 NOTE — ED Triage Notes (Signed)
 Pt BIB EMS from home for c/o AMS; girlfriend told ems was not talking this am  LKW was 9pm last night  Pt has no complaints, stroke screen with ems negative  Cbg 250 BP 132/76 HR 82 O2 98% on RA  18g RAC
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
## 2024-09-11 NOTE — ED Notes (Signed)
 Pt attempted to use urinal but was unable to void, pt and family stated they would use the call bell once pt is able to void for urine collection.

## 2024-09-11 NOTE — Anesthesia Postprocedure Evaluation (Signed)
 Anesthesia Post Note  Patient: Anthony Price  Procedure(s) Performed: CRANIOTOMY HEMATOMA EVACUATION SUBDURAL (Right: Head)     Patient location during evaluation: PACU Anesthesia Type: General Level of consciousness: awake and alert Pain management: pain level controlled Vital Signs Assessment: post-procedure vital signs reviewed and stable Respiratory status: spontaneous breathing, nonlabored ventilation, respiratory function stable and patient connected to nasal cannula oxygen Cardiovascular status: blood pressure returned to baseline and stable Postop Assessment: no apparent nausea or vomiting Anesthetic complications: no   No notable events documented.  Last Vitals:  Vitals:   09/11/24 1915 09/11/24 1930  BP: 127/69 128/69  Pulse: 95 96  Resp: 18 16  Temp:    SpO2: 100% 100%    Last Pain:  Vitals:   09/11/24 1715  TempSrc: Oral  PainSc: 0-No pain                 Franky JONETTA Bald

## 2024-09-11 NOTE — Transfer of Care (Signed)
 Immediate Anesthesia Transfer of Care Note  Patient: Anthony Price  Procedure(s) Performed: CRANIOTOMY HEMATOMA EVACUATION SUBDURAL (Right: Head)  Patient Location: PACU  Anesthesia Type:General  Level of Consciousness: awake  Airway & Oxygen Therapy: Patient Spontanous Breathing  Post-op Assessment: Report given to RN and Post -op Vital signs reviewed and stable  Post vital signs: Reviewed and stable  Last Vitals:  Vitals Value Taken Time  BP 144/74 09/11/24 15:30  Temp 36 C 09/11/24 15:16  Pulse 84 09/11/24 15:30  Resp 18 09/11/24 15:30  SpO2 99 % 09/11/24 15:30  Vitals shown include unfiled device data.  Last Pain:  Vitals:   09/11/24 1326  TempSrc: Oral  PainSc:          Complications: No notable events documented.

## 2024-09-11 NOTE — Anesthesia Procedure Notes (Signed)
 Arterial Line Insertion Start/End11/16/2025 2:15 PM, 09/11/2024 2:20 PM Performed by: Tilford Franky BIRCH, MD, anesthesiologist  Patient location: Pre-op. Preanesthetic checklist: patient identified, IV checked, site marked, risks and benefits discussed, surgical consent, monitors and equipment checked, pre-op evaluation, timeout performed and anesthesia consent Lidocaine  1% used for infiltration Left, radial was placed Catheter size: 5 French Hand hygiene performed  and maximum sterile barriers used   Attempts: 3 Following insertion, dressing applied and Biopatch. Post procedure assessment: normal and unchanged  Post procedure complications: second provider assisted. Patient tolerated the procedure well with no immediate complications.

## 2024-09-11 NOTE — ED Provider Notes (Signed)
 [place] EMERGENCY DEPARTMENT AT [facility] Provider Note   CSN: [id number] Arrival date & time: 09/11/24  9064     Patient presents with: Altered Mental Status   Anthony Price is a 68 y.o. male.   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  History obtained per the patient and his girlfriend as well as EMS.  Last known well was 9 PM last night.  This morning his girlfriend noticed that he appeared to be confused and had difficulty speaking.  Per EMS stroke screen was negative.  Not on blood thinners.  Initially denied any falls but brother reports that he thinks his brother fell several days ago.  No headache or neck pain.  Denies any other neurologic symptoms       Prior to Admission medications   Medication Sig Start Date End Date Taking? Authorizing Provider  ACCU-CHEK SOFTCLIX LANCETS lancets Test once qd. DX E11.9 06/19/16   Dettinger, Fonda LABOR, MD  acetaminophen (TYLENOL) 500 MG tablet Take 1,000 mg by mouth every 6 (six) hours as needed for moderate pain. Pain    [provider]  allopurinol (ZYLOPRIM) 300 MG tablet Take 300 mg by mouth daily. 08/04/24 08/18/24  [provider]  ALPRAZolam  (XANAX ) 1 MG tablet TAKE (1) TABLET BY MOUTH THREE TIMES DAILY. 09/02/24   Okey Barnie SAUNDERS, MD  amLODipine  (NORVASC ) 2.5 MG tablet Take 1 tablet (2.5 mg total) by mouth daily. 05/09/24   Dettinger, Fonda LABOR, MD  aspirin EC 81 MG tablet Take 81 mg by mouth daily.    [provider]  atorvastatin  (LIPITOR) 40 MG tablet Take 1 tablet (40 mg total) by mouth daily. 05/09/24   Dettinger, Fonda LABOR, MD  Blood Glucose Monitoring Suppl (ACCU-CHEK AVIVA PLUS) w/Device KIT Test qd. DX E11.9 07/04/20   Dettinger, Fonda LABOR, MD  buPROPion  (WELLBUTRIN  XL) 300 MG 24 hr tablet TAKE 1 TABLET BY MOUTH ONCE EVERY MORNING. 09/02/24    Okey Barnie SAUNDERS, MD  carvedilol  (COREG ) 12.5 MG tablet TAKE (1) TABLET BY MOUTH TWICE DAILY. 05/09/24   Dettinger, Fonda LABOR, MD  cefadroxil  (DURICEF) 500 MG capsule Take 1 capsule (500 mg total) by mouth 2 (two) times daily. 06/27/23   Silver Wonda LABOR, PA  dapagliflozin  propanediol (FARXIGA ) 10 MG TABS tablet Take 1 tablet (10 mg total) by mouth daily before breakfast. 11/05/23   Dettinger, Fonda LABOR, MD  esomeprazole  (NEXIUM ) 20 MG capsule TAKE (1) CAPSULE BY MOUTH TWICE DAILYBEFORE A MEAL 11/05/23   Dettinger, Fonda LABOR, MD  fenofibrate  (TRICOR ) 48 MG tablet Take 1 tablet (48 mg total) by mouth daily. 05/09/24   Dettinger, Fonda LABOR, MD  glucose blood (ACCU-CHEK AVIVA PLUS) test strip Test once qd. DX E11.9 02/22/24   Dettinger, Fonda LABOR, MD  insulin  degludec (TRESIBA  FLEXTOUCH) 100 UNIT/ML FlexTouch Pen Inject 9 Units into the skin daily. Inject 9 Units into the skin daily. 05/09/24   Dettinger, Fonda LABOR, MD  Insulin  Pen Needle (PEN NEEDLES 31GX5/16) 31G X 8 MM MISC 1 each by Does not apply route daily. 02/04/24   Dettinger, Fonda LABOR, MD  magnesium  30 MG tablet Take 30 mg by mouth 2 (two) times daily.    [provider]  metFORMIN  (GLUCOPHAGE ) 500 MG tablet Take 1 tablet (500 mg total) by mouth 2 (two) times daily with a meal. 01/06/24   Dettinger, Fonda LABOR, MD  mupirocin   ointment (BACTROBAN ) 2 % Apply 1 Application topically 2 (two) times daily. 06/27/23   Silver Wonda LABOR, PA  mycophenolate (CELLCEPT) 250 MG capsule Take 250 mg by mouth 2 (two) times daily. 08/23/24   [provider]  Omega-3 1000 MG CAPS Take 1,000 mg by mouth in the morning and at bedtime.    [provider]  Probiotic Product (PROBIOTIC DAILY PO) Take 1 capsule by mouth daily.     [provider]  ramipril (ALTACE) 2.5 MG capsule Take 2.5 mg by mouth daily.    [provider]  sildenafil  (REVATIO ) 20 MG tablet Take 1-5 tablets (20-100 mg total) by mouth daily as needed. 05/09/24   Dettinger,  Fonda LABOR, MD  sitaGLIPtin  (JANUVIA ) 50 MG tablet TAKE ONE TABLET BY MOUTH EVERY DAY 08/23/24   Dettinger, Fonda LABOR, MD  tacrolimus  (PROGRAF ) 0.5 MG capsule Take 0.5 mg by mouth 2 (two) times daily. 11/30/20   [provider]  Vilazodone  HCl (VIIBRYD ) 10 MG TABS Take 10 mg by mouth daily. 09/02/24   [provider]  Vilazodone  HCl (VIIBRYD ) 20 MG TABS Take 1 tablet (20 mg total) by mouth daily. 09/02/24   Okey Barnie SAUNDERS, MD    Allergies: Lactose intolerance (gi) and Tramadol    Review of Systems  Updated Vital Signs BP (!) 140/75   Pulse 85   Temp 98.1 F (36.7 C) (Oral)   Resp 17   Ht 5' 8 (1.727 m)   Wt 59.4 kg   SpO2 100%   BMI 19.91 kg/m   Physical Exam Constitutional:      Appearance: Normal appearance.  HENT:     Head: Normocephalic and atraumatic.  Neck:     Comments: No C-spine midline tenderness to palpation Neurological:     Mental Status: He is alert.     Comments: NIHSS Exam  Level of Consciousness: Alert  LOC Questions: Difficulty answering LOC Commands: Opens and Closes Eyes and Hands on command  Best Gaze: Horizontal ocular movements intact  Visual Fields: No visual field loss  Facial Palsy: None  L Upper Extremity Motor: No drift after 10 seconds  R Upper Extremity Motor: No drift after 10 seconds  L Lower extremity Motor: No drift after 5 seconds  R Lower extremity Motor: No drift after 5 seconds  Ataxia: Absent  Sensory: Intact sensation to light touch on face, arms, trunk, and legs bilaterally  Best Language: Mild expressive aphasia Dysarthria: No dysarthria  Neglect: No visual or sensory neglect        (all labs ordered are listed, but only abnormal results are displayed) Labs Reviewed  CBC - Abnormal; Notable for the following components:      Result Value   Hemoglobin 11.3 (*)    HCT 37.8 (*)    MCV 77.9 (*)    MCH 23.3 (*)    MCHC 29.9 (*)    RDW 17.4 (*)    All other components within normal limits  COMPREHENSIVE  METABOLIC PANEL WITH GFR - Abnormal; Notable for the following components:   Glucose, Bld 219 (*)    Creatinine, Ser 1.95 (*)    GFR, Estimated 37 (*)    All other components within normal limits  CBG MONITORING, ED - Abnormal; Notable for the following components:   Glucose-Capillary 204 (*)    All other components within normal limits  APTT  DIFFERENTIAL  ETHANOL  PROTIME-INR  URINE DRUG SCREEN  TACROLIMUS  LEVEL    EKG: EKG Interpretation Date/Time:  Sunday September 11 2024 10:02:21 EST Ventricular Rate:  78 PR Interval:    QRS Duration:  122 QT Interval:  383 QTC Calculation: 437 R Axis:   52  Text Interpretation: Normal sinus rhythm IVCD, consider atypical RBBB Artifact in lead(s) I II aVR aVL Confirmed by Yolande Charleston [id number]) on 09/11/2024 10:56:13 AM  Radiology: CT ANGIO HEAD NECK W WO CM Result Date: 09/11/2024 EXAM: CTA HEAD AND NECK WITH AND WITHOUT 09/11/2024 10:59:41 AM TECHNIQUE: CTA of the head and neck was performed with and without the administration of 60 mL of iohexol (OMNIPAQUE) 350 MG/ML intravenous contrast. Multiplanar 2D and/or 3D reformatted images are provided for review. Automated exposure control, iterative reconstruction, and/or weight based adjustment of the mA/kV was utilized to reduce the radiation dose to as low as reasonably achievable. Stenosis of the internal carotid arteries measured using NASCET criteria. COMPARISON: None available CLINICAL HISTORY: Neuro deficit, acute, stroke suspected; Confusion aphasia. FINDINGS: CTA NECK: AORTIC ARCH AND ARCH VESSELS: No dissection or arterial injury. No significant stenosis of the brachiocephalic or subclavian arteries. CERVICAL CAROTID ARTERIES: No dissection, arterial injury, or hemodynamically significant stenosis by NASCET criteria. CERVICAL VERTEBRAL ARTERIES: No dissection, arterial injury, or significant stenosis. LUNGS AND MEDIASTINUM: Unremarkable. SOFT TISSUES: No acute abnormality. BONES: No  acute abnormality. CTA HEAD: ANTERIOR CIRCULATION: No significant stenosis of the internal carotid arteries. No significant stenosis of the anterior cerebral arteries. No significant stenosis of the middle cerebral arteries. No aneurysm. POSTERIOR CIRCULATION: No significant stenosis of the posterior cerebral arteries. No significant stenosis of the basilar artery. No significant stenosis of the vertebral arteries. No aneurysm. OTHER: No dural venous sinus thrombosis on this non-dedicated study. Traumatic brain injury risk stratification: Skull fracture: no (low - MBIG 1) Subdural hematoma (SDH): large right chronic hemispheric subdural hematoma present, which measures up to 2.4 cm in thickness and extends approximately 11 cm in AP dimension and 7.3 cm in craniocaudad length (high - MBIG 3) Subarachnoid hemorrhage [facility]): no Epidural hematoma (EDH): no (low - MBIG 1) Cerebral contusion, intra-axial, intraparenchymal hemorrhage (IPH): no Intraventricular hemorrhage (IVH): no (low - MBIG 1) Midline shift > 1mm or edema/effacement of sulci/vents: moderate mass effect upon the right cerebral hemisphere with shifted midline structures to the left by approximately 9 mm. The right cerebral sulci are largely effaced (high - MBIG 3) IMPRESSION: 1. Large right chronic hemispheric subdural hematoma measuring up to 2.4 cm in thickness with approximately 11 cm AP and 7.3 cm craniocaudal extent, causing moderate mass effect with 9 mm leftward midline shift and near-complete effacement of right cerebral sulci; calvarium intact 2. TBI risk stratification: high - mbig 3 3. Findings discussed with Dr. Charleston Pries he erson at 11:17 AM 09/11/24 Electronically signed by: Evalene Coho MD 09/11/2024 11:18 AM EST RP Workstation: [id number]     Procedures   Medications Ordered in the ED  iohexol (OMNIPAQUE) 350 MG/ML injection 60 mL (60 mLs Intravenous Contrast Given 09/11/24 1048)    Clinical Course as of 09/11/24 1232  Sun  Sep 11, 2024  1055 Creatinine(!): 1.95 At baseline [RP]  1133 Suzen Pean from neurosurgery recommends admission [facility] craniectomy [facility].  [RP]  1138 Dr Kassie from ICU consulted for admission.  [RP]  1142 Per carelink will take patient to [facility].  [RP]    Clinical Course User Index [RP] Yolande Charleston BROCKS, MD  Medical Decision Making Amount and/or Complexity of Data Reviewed Labs: ordered. Decision-making details documented in ED Course. Radiology: ordered.  Risk Prescription drug management. Decision regarding hospitalization.   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  Initial Ddx:  Stroke, ICH, meningitis, encephalopathy  MDM/Course:  Patient presents emergency department with confusion.  Last known normal was 9 PM last night.  Appears to have some expressive aphasia.  No upper extremity weakness and so is not within the window for code stroke activation since he is out of the TNK window and is Van negative.  Mentating well and no airway concerns.  Had a CT of the head and CTA which shows that he has a large subdural hematoma with mass effect.  No signs of herniation or cerebral edema.  Not on blood thinners.  Blood pressure is WNL.  Discussed with neurosurgery who would like to take to the OR today.  Admitted to the ICU for further management afterwards.   This patient presents to the ED for concern of complaints listed in HPI, this involves an extensive number of treatment options, and is a complaint that carries with it a high risk of complications and morbidity. Disposition including potential need for admission considered.   Dispo: ICU  Additional history obtained from significant other Records reviewed Outpatient Clinic Notes The following labs were independently interpreted:  Chemistry and show CKD I independently reviewed the following imaging with scope of interpretation limited to determining acute life threatening conditions related to emergency care: CT Head and agree with the radiologist interpretation with the following exceptions: none I personally reviewed and interpreted cardiac monitoring: normal sinus rhythm  I personally reviewed and interpreted the pt's EKG: see above for interpretation  I have reviewed the patients home medications and made adjustments as needed Consults: Critical care and Neurosurgery Social Determinants of health:  Geriatric  CRITICAL CARE Performed by: Lamar JAYSON Shan   Total critical care time: 30 minutes  Critical care time was exclusive of separately billable procedures and treating other patients.  Critical care was necessary to treat or prevent imminent or life-threatening deterioration.  Critical care was time spent personally by me on the following activities: development of treatment plan with patient and/or surrogate as well as nursing, discussions with consultants, evaluation of patient's response to treatment, examination of patient, obtaining history from patient or surrogate, ordering and performing treatments and interventions, ordering and review of laboratory studies, ordering and review of radiographic studies, pulse oximetry and re-evaluation of patient's condition.   Portions of this note were generated with Scientist, clinical (histocompatibility and immunogenetics). Dictation errors may occur despite best attempts at proofreading.     Final diagnoses:  Subdural hematoma [facility])  Confusion    ED Discharge Orders     None          Shan Lamar JAYSON, MD 09/11/24 1232

## 2024-09-11 NOTE — ED Notes (Signed)
 Dr Yolande looked at patient at arrival and at this time he is not activating a code stroke. Rn notified
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
## 2024-09-11 NOTE — Anesthesia Procedure Notes (Signed)
 Procedure Name: Intubation Date/Time: 09/11/2024 2:14 PM  Performed by: Arvell Edsel HERO, CRNAPre-anesthesia Checklist: Patient identified, Emergency Drugs available, Suction available, Patient being monitored and Timeout performed Patient Re-evaluated:Patient Re-evaluated prior to induction Oxygen Delivery Method: Circle system utilized Preoxygenation: Pre-oxygenation with 100% oxygen Induction Type: IV induction Ventilation: Mask ventilation without difficulty and Oral airway inserted - appropriate to patient size Laryngoscope Size: Glidescope and 4 Grade View: Grade I Tube type: Oral Tube size: 7.0 mm Number of attempts: 1 Airway Equipment and Method: Patient positioned with wedge pillow and Video-laryngoscopy Placement Confirmation: ETT inserted through vocal cords under direct vision, positive ETCO2, CO2 detector and breath sounds checked- equal and bilateral Secured at: 23 cm Tube secured with: Tape Dental Injury: Teeth and Oropharynx as per pre-operative assessment

## 2024-09-11 NOTE — Progress Notes (Signed)
 Pt has expressive aphasia, however he gestures and responses to questions appropriately. Pt states his name, birthday, location, and aware he is having surgery for his head. I feel this patient understands, PA agrees pt able to consent for surgery.

## 2024-09-12 ENCOUNTER — Inpatient Hospital Stay (HOSPITAL_COMMUNITY)

## 2024-09-12 DIAGNOSIS — Z9889 Other specified postprocedural states: Secondary | ICD-10-CM

## 2024-09-12 DIAGNOSIS — E1165 Type 2 diabetes mellitus with hyperglycemia: Secondary | ICD-10-CM

## 2024-09-12 LAB — CBC
HCT: 35.3 % — ABNORMAL LOW (ref 39.0–52.0)
Hemoglobin: 10.9 g/dL — ABNORMAL LOW (ref 13.0–17.0)
MCH: 23 pg — ABNORMAL LOW (ref 26.0–34.0)
MCHC: 30.9 g/dL (ref 30.0–36.0)
MCV: 74.6 fL — ABNORMAL LOW (ref 80.0–100.0)
Platelets: 206 K/uL (ref 150–400)
RBC: 4.73 MIL/uL (ref 4.22–5.81)
RDW: 16.9 % — ABNORMAL HIGH (ref 11.5–15.5)
WBC: 13.3 K/uL — ABNORMAL HIGH (ref 4.0–10.5)
nRBC: 0 % (ref 0.0–0.2)

## 2024-09-12 LAB — GLUCOSE, CAPILLARY
Glucose-Capillary: 233 mg/dL — ABNORMAL HIGH (ref 70–99)
Glucose-Capillary: 193 mg/dL — ABNORMAL HIGH (ref 70–99)
Glucose-Capillary: 306 mg/dL — ABNORMAL HIGH (ref 70–99)
Glucose-Capillary: 280 mg/dL — ABNORMAL HIGH (ref 70–99)
Glucose-Capillary: 276 mg/dL — ABNORMAL HIGH (ref 70–99)
Glucose-Capillary: 293 mg/dL — ABNORMAL HIGH (ref 70–99)

## 2024-09-12 LAB — BASIC METABOLIC PANEL WITH GFR
Anion gap: 14 (ref 5–15)
BUN: 22 mg/dL (ref 8–23)
CO2: 18 mmol/L — ABNORMAL LOW (ref 22–32)
Calcium: 9.2 mg/dL (ref 8.9–10.3)
Chloride: 103 mmol/L (ref 98–111)
Creatinine, Ser: 1.53 mg/dL — ABNORMAL HIGH (ref 0.61–1.24)
GFR, Estimated: 50 mL/min — ABNORMAL LOW (ref >60)
Glucose, Bld: 278 mg/dL — ABNORMAL HIGH (ref 70–99)
Potassium: 4.6 mmol/L (ref 3.5–5.1)
Sodium: 135 mmol/L (ref 135–145)

## 2024-09-12 LAB — HEMOGLOBIN A1C
Hgb A1c MFr Bld: 7.9 % — ABNORMAL HIGH (ref 4.8–5.6)
Mean Plasma Glucose: 180 mg/dL

## 2024-09-12 MED ORDER — LEVETIRACETAM (KEPPRA) 500 MG/5 ML ADULT IV PUSH
500.0000 mg | Freq: Two times a day (BID) | INTRAVENOUS | Status: DC
  Administered 2024-09-12: 500 mg via INTRAVENOUS
  Filled 2024-09-12: qty 5

## 2024-09-12 MED ORDER — ALPRAZOLAM 0.5 MG PO TABS
1.0000 mg | ORAL_TABLET | Freq: Three times a day (TID) | ORAL | Status: DC | PRN
  Administered 2024-09-12 – 2024-09-13 (×4): 1 mg via ORAL
  Filled 2024-09-12 (×4): qty 2

## 2024-09-12 MED ORDER — SENNA 8.6 MG PO TABS: 1.0000 | ORAL_TABLET | Freq: Every day | ORAL | Status: DC | PRN

## 2024-09-12 MED ORDER — INSULIN ASPART 100 UNIT/ML IJ SOLN
0.0000 [IU] | Freq: Three times a day (TID) | INTRAMUSCULAR | Status: DC
  Administered 2024-09-12: 4 [IU] via SUBCUTANEOUS
  Administered 2024-09-12: 15 [IU] via SUBCUTANEOUS
  Filled 2024-09-12: qty 11
  Filled 2024-09-12: qty 4
  Filled 2024-09-12: qty 15

## 2024-09-12 MED ORDER — INSULIN GLARGINE-YFGN 100 UNIT/ML ~~LOC~~ SOLN
5.0000 [IU] | Freq: Every day | SUBCUTANEOUS | Status: DC
  Administered 2024-09-12 – 2024-09-13 (×2): 5 [IU] via SUBCUTANEOUS
  Filled 2024-09-12 (×3): qty 0.05

## 2024-09-12 MED ORDER — INSULIN ASPART 100 UNIT/ML IJ SOLN
0.0000 [IU] | Freq: Every day | INTRAMUSCULAR | Status: DC
  Administered 2024-09-12: 3 [IU] via SUBCUTANEOUS
  Filled 2024-09-12: qty 5

## 2024-09-12 MED ORDER — LEVETIRACETAM 500 MG PO TABS
500.0000 mg | ORAL_TABLET | Freq: Two times a day (BID) | ORAL | Status: DC
  Administered 2024-09-12 – 2024-09-14 (×4): 500 mg via ORAL
  Filled 2024-09-12 (×4): qty 1

## 2024-09-12 MED ORDER — INSULIN ASPART 100 UNIT/ML IJ SOLN
0.0000 [IU] | Freq: Three times a day (TID) | INTRAMUSCULAR | Status: DC
  Administered 2024-09-12: 11 [IU] via SUBCUTANEOUS
  Administered 2024-09-13: 20 [IU] via SUBCUTANEOUS
  Administered 2024-09-13 (×2): 11 [IU] via SUBCUTANEOUS
  Administered 2024-09-14: 20 [IU] via SUBCUTANEOUS
  Administered 2024-09-14: 11 [IU] via SUBCUTANEOUS
  Filled 2024-09-12: qty 11
  Filled 2024-09-12: qty 20
  Filled 2024-09-12 (×2): qty 11
  Filled 2024-09-12: qty 20
  Filled 2024-09-12: qty 11

## 2024-09-12 NOTE — Progress Notes (Signed)
 Subjective: Patient reports doing ok, no headaches  Objective: Vital signs in last 24 hours: Temp:  [96.8 F (36 C)-98.4 F (36.9 C)] 98 F (36.7 C) (11/17 0400) Pulse Rate:  [77-104] 90 (11/17 0700) Resp:  [11-25] 11 (11/17 0700) BP: (105-160)/(61-97) 141/97 (11/17 0700) SpO2:  [96 %-100 %] 99 % (11/17 0700) Arterial Line BP: (110-204)/(51-101) 115/90 (11/17 0000) Weight:  [59.4 kg-60.7 kg] 60.7 kg (11/17 0500)  Intake/Output from previous day: 11/16 0701 - 11/17 0700 In: 1836.7 [I.V.:1639.2; IV Piggyback:197.4] Out: 2020 [Urine:1895; Drains:105; Blood:20] Intake/Output this shift: No intake/output data recorded.  Neurologic: Grossly normal  Lab Results: Lab Results  Component Value Date   WBC 6.2 09/11/2024   HGB 10.2 (L) 09/11/2024   HCT 33.8 (L) 09/11/2024   MCV 76.5 (L) 09/11/2024   PLT 191 09/11/2024   Lab Results  Component Value Date   INR 1.1 09/11/2024   BMET Lab Results  Component Value Date   NA 136 09/11/2024   K 4.8 09/11/2024   CL 101 09/11/2024   CO2 23 09/11/2024   GLUCOSE 219 (H) 09/11/2024   BUN 20 09/11/2024   CREATININE 1.95 (H) 09/11/2024   CALCIUM  9.4 09/11/2024    Studies/Results: CT HEAD WO CONTRAST Result Date: 09/12/2024 EXAM: CT HEAD WITHOUT CONTRAST 09/12/2024 05:56:30 AM TECHNIQUE: CT of the head was performed without the administration of intravenous contrast. Automated exposure control, iterative reconstruction, and/or weight based adjustment of the mA/kV was utilized to reduce the radiation dose to as low as reasonably achievable. COMPARISON: CT of the head dated 09/11/2024. CLINICAL HISTORY: FINDINGS: BRAIN AND VENTRICLES: The patient has undergone right parietal craniotomy and placement of a subdural drain. There is a chronic right-sided subdural hematoma, which has decreased in size in the interim. There is now less mass effect upon the right cerebral hemisphere with interval decrease in shift of the midline structures to the  left from 9 mm to 5 mm. There has been interval right subdural hemorrhage. Moderate intracranial air is now present. No evidence of acute infarct. No hydrocephalus. ORBITS: No acute abnormality. SINUSES: No acute abnormality. SOFT TISSUES AND SKULL: Right parietal craniotomy and subdural drain are present. No acute soft tissue abnormality. No skull fracture. IMPRESSION: 1. Interval decrease in size of the chronic right-sided subdural hematoma and associated mass effect, with midline shift now measuring 5 mm (previously 9 mm). 2. Interval hemorrhage into the right subdural collection, but overall decreased in size. 3. Moderate intracranial air. Electronically signed by: Evalene Coho MD 09/12/2024 06:24 AM EST RP Workstation: [id number]   CT ANGIO HEAD NECK W WO CM Result Date: 09/11/2024 EXAM: CTA HEAD AND NECK WITH AND WITHOUT 09/11/2024 10:59:41 AM TECHNIQUE: CTA of the head and neck was performed with and without the administration of 60 mL of iohexol (OMNIPAQUE) 350 MG/ML intravenous contrast. Multiplanar 2D and/or 3D reformatted images are provided for review. Automated exposure control, iterative reconstruction, and/or weight based adjustment of the mA/kV was utilized to reduce the radiation dose to as low as reasonably achievable. Stenosis of the internal carotid arteries measured using NASCET criteria. COMPARISON: None available CLINICAL HISTORY: Neuro deficit, acute, stroke suspected; Confusion aphasia. FINDINGS: CTA NECK: AORTIC ARCH AND ARCH VESSELS: No dissection or arterial injury. No significant stenosis of the brachiocephalic or subclavian arteries. CERVICAL CAROTID ARTERIES: No dissection, arterial injury, or hemodynamically significant stenosis by NASCET criteria. CERVICAL VERTEBRAL ARTERIES: No dissection, arterial injury, or significant stenosis. LUNGS AND MEDIASTINUM: Unremarkable. SOFT TISSUES: No acute abnormality. BONES: No acute abnormality.  CTA HEAD: ANTERIOR CIRCULATION: No  significant stenosis of the internal carotid arteries. No significant stenosis of the anterior cerebral arteries. No significant stenosis of the middle cerebral arteries. No aneurysm. POSTERIOR CIRCULATION: No significant stenosis of the posterior cerebral arteries. No significant stenosis of the basilar artery. No significant stenosis of the vertebral arteries. No aneurysm. OTHER: No dural venous sinus thrombosis on this non-dedicated study. Traumatic brain injury risk stratification: Skull fracture: no (low - MBIG 1) Subdural hematoma (SDH): large right chronic hemispheric subdural hematoma present, which measures up to 2.4 cm in thickness and extends approximately 11 cm in AP dimension and 7.3 cm in craniocaudad length (high - MBIG 3) Subarachnoid hemorrhage [facility]): no Epidural hematoma (EDH): no (low - MBIG 1) Cerebral contusion, intra-axial, intraparenchymal hemorrhage (IPH): no Intraventricular hemorrhage (IVH): no (low - MBIG 1) Midline shift > 1mm or edema/effacement of sulci/vents: moderate mass effect upon the right cerebral hemisphere with shifted midline structures to the left by approximately 9 mm. The right cerebral sulci are largely effaced (high - MBIG 3) IMPRESSION: 1. Large right chronic hemispheric subdural hematoma measuring up to 2.4 cm in thickness with approximately 11 cm AP and 7.3 cm craniocaudal extent, causing moderate mass effect with 9 mm leftward midline shift and near-complete effacement of right cerebral sulci; calvarium intact 2. TBI risk stratification: high - mbig 3 3. Findings discussed with Dr. Lamar Pries he erson at 11:17 AM 09/11/24 Electronically signed by: Evalene Coho MD 09/11/2024 11:18 AM EST RP Workstation: [id number]    Assessment/Plan: Postop day 1 crani for SDH. Doing well, no aphasia. He really just has speech hesitancy. D/c a line and get out of bed today   LOS: 1 day    Anthony Price 09/12/2024, 7:37 AM

## 2024-09-12 NOTE — H&P (Addendum)
 Error

## 2024-09-12 NOTE — Inpatient Diabetes Management (Signed)
 Inpatient Diabetes Program Recommendations  AACE/ADA: New Consensus Statement on Inpatient Glycemic Control   Target Ranges:  Prepandial:   less than 140 mg/dL      Peak postprandial:   less than 180 mg/dL (1-2 hours)      Critically ill patients:  140 - 180 mg/dL    Latest Reference Range & Units 09/11/24 10:27 09/11/24 13:34 09/11/24 15:56 09/11/24 19:22 09/11/24 23:09 09/12/24 03:10 09/12/24 08:21  Glucose-Capillary 70 - 99 mg/dL 795 (H) 844 (H) 840 (H) 204 (H) 295 (H) 233 (H) 193 (H)   Review of Glycemic Control  Diabetes history: DM2 Outpatient Diabetes medications: Tresiba  9 units daily, Metformin  500 mg BID, Farxiga  10 mg daily, Januvia  50 mg daily Current orders for Inpatient glycemic control: Semglee 5 units daily, Novolog  0-20 units TID with meals; Decadron 6 mg Q6H (tapering)  Inpatient Diabetes Program Recommendations:    Insulin : If steroids are continued as ordered, please consider increasing Semglee to 10 units daily and change Novolog  0-20 units to AC&HS.  Diet: May want to consider changing diet to Carb Modified if appropriate.  Thanks, Earnie Gainer, RN, MSN, CDCES Diabetes Coordinator Inpatient Diabetes Program [phone] (Team Pager from 8am to 5pm)
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
## 2024-09-12 NOTE — Progress Notes (Signed)
 eLink Physician-Brief Progress Note Patient Name: Anthony Price DOB: 07-Nov-1955 MRN: [id number]   Date of Service  09/12/2024  HPI/Events of Note  68 year old male status post heart transplant in the setting of metabolic syndrome and CKD who initially presented with AMS Status post evacuation of his subacute right subdural hematoma A-line no longer functioning, unable to draw blood or assess blood pressure.  Cuff correlated with arterial line, on 8 of Cleviprex  eICU Interventions  Given sphygmomanometry correlated well with the arterial line, discontinue the arterial line and titrate per cuff for the time being    Intervention Category Intermediate Interventions: Hypertension - evaluation and management  Stevin Bielinski 09/12/2024, 3:14 AM

## 2024-09-12 NOTE — Evaluation (Signed)
 Physical Therapy Evaluation Patient Details Name: Anthony Price MRN: [id number] DOB: [birth year] Today's Date: 09/12/2024  History of Present Illness  68 year old male who presented 09/11/24 with AMS and expressive aphasia. Fell in the bathroom a few days ago. CTA with large right chronic subdural hematoma with mass effect and 9 mm leftward midline shift and near-complete effacement of right cerebral sulci. 11/16 Right frontotemporoparietal craniotomy PMH-s/p heart transplant in 2000 on cellcept, post transplant lymphoproliferative disease managed on research protocol at [facility], pulmonary hypertension, HTN, CKD, HLD, NIDDM2, depression/anxiety  Clinical Impression   Pt admitted secondary to problem above with deficits below. PTA patient was completely independent with mobility and ADLs (including driving). Pt currently requires CGA for ambulation and higher-level balance activities likely due to minor deconditioning as this was pt's first time up. Anticipate pt will quickly progress and not need further PT on discharge. Anticipate patient will benefit from PT to address problems listed below. Will continue to follow acutely to maximize functional mobility, independence, and safety.    NOTE: recommend SLP language evaluation         If plan is discharge home, recommend the following:     Can travel by private vehicle        Equipment Recommendations None recommended by PT  Recommendations for Other Services  Speech consult    Functional Status Assessment Patient has had a recent decline in their functional status and demonstrates the ability to make significant improvements in function in a reasonable and predictable amount of time.     Precautions / Restrictions Precautions Precautions: Other (comment) Recall of Precautions/Restrictions: Intact Precaution/Restrictions Comments: expressive difficulties      Mobility  Bed Mobility Overal bed mobility: Modified Independent              General bed mobility comments: HOB elevated    Transfers Overall transfer level: Needs assistance Equipment used: None Transfers: Sit to/from Stand Sit to Stand: Contact guard assist           General transfer comment: for safety; denied dizziness despite small drop in BP    Ambulation/Gait Ambulation/Gait assistance: Contact guard assist Gait Distance (Feet): 100 Feet Assistive device: None Gait Pattern/deviations: WFL(Within Functional Limits)   Gait velocity interpretation: 1.31 - 2.62 ft/sec, indicative of limited community ambulator   General Gait Details: no imbalance noted; decr velocity  Stairs            Wheelchair Mobility     Tilt Bed    Modified Rankin (Stroke Patients Only) Modified Rankin (Stroke Patients Only) Pre-Morbid Rankin Score: No symptoms Modified Rankin: Moderately severe disability     Balance Overall balance assessment: Independent (Rhomberg eyes closed x 10 sec no difficulty)                                           Pertinent Vitals/Pain Pain Assessment Pain Assessment: Faces Faces Pain Scale: Hurts a little bit Pain Location: head Pain Descriptors / Indicators: Headache Pain Intervention(s): Limited activity within patient's tolerance, Monitored during session, Repositioned    Home Living Family/patient expects to be discharged to:: Private residence Living Arrangements: Spouse/significant other Available Help at Discharge: Family;Available 24 hours/day Type of Home: Mobile home Home Access: Stairs to enter Entrance Stairs-Rails: Right;Left;Can reach both Entrance Stairs-Number of Steps: 5   Home Layout: One level Home Equipment: None      Prior  Function Prior Level of Function : Independent/Modified Independent;Driving;Other (comment) (retired)                     Extremity/Trunk Assessment   Upper Extremity Assessment Upper Extremity Assessment: Right hand dominant;Overall  [facility] for tasks assessed    Lower Extremity Assessment Lower Extremity Assessment: Overall WFL for tasks assessed    Cervical / Trunk Assessment Cervical / Trunk Assessment: Normal  Communication   Communication Communication: Impaired Factors Affecting Communication: Difficulty expressing self    Cognition Arousal: Alert Behavior During Therapy: WFL for tasks assessed/performed   PT - Cognitive impairments: Difficult to assess Difficult to assess due to: Impaired communication                     PT - Cognition Comments: able to state name, DOB Following commands: Intact       Cueing Cueing Techniques: Verbal cues     General Comments General comments (skin integrity, edema, etc.): Girlfriend present and able to provide home setup as pt having difficulty getting words out    Exercises     Assessment/Plan    PT Assessment Patient needs continued PT services  PT Problem List Decreased activity tolerance;Decreased mobility;Pain       PT Treatment Interventions Gait training;Stair training;Functional mobility training;Therapeutic activities;Balance training;Patient/family education    PT Goals (Current goals can be found in the Care Plan section)  Acute Rehab PT Goals Patient Stated Goal: unable to state; agrees with PT goals PT Goal Formulation: With patient Time For Goal Achievement: 09/26/24 Potential to Achieve Goals: Good    Frequency Min 3X/week     Co-evaluation               AM-PAC PT 6 Clicks Mobility  Outcome Measure Help needed turning from your back to your side while in a flat bed without using bedrails?: None Help needed moving from lying on your back to sitting on the side of a flat bed without using bedrails?: None Help needed moving to and from a bed to a chair (including a wheelchair)?: A Little Help needed standing up from a chair using your arms (e.g., wheelchair or bedside chair)?: A Little Help needed to walk in hospital  room?: A Little Help needed climbing 3-5 steps with a railing? : A Little 6 Click Score: 20    End of Session Equipment Utilized During Treatment: Gait belt Activity Tolerance: Patient tolerated treatment well Patient left: in chair;with call bell/phone within reach;with chair alarm set;with nursing/sitter in room;with family/visitor present Nurse Communication: Mobility status;Other (comment) (need for SLP eval) PT Visit Diagnosis: Difficulty in walking, not elsewhere classified (R26.2)    Time: 8940-8874 PT Time Calculation (min) (ACUTE ONLY): 26 min   Charges:   PT Evaluation $PT Eval Low Complexity: 1 Low PT Treatments $Gait Training: 8-22 mins PT General Charges $$ ACUTE PT VISIT: 1 Visit          Macario RAMAN, PT Acute Rehabilitation Services  Office [phone]   Macario SHAUNNA Soja 09/12/2024, 11:41 AM

## 2024-09-13 ENCOUNTER — Encounter (HOSPITAL_COMMUNITY): Payer: Self-pay | Admitting: Neurological Surgery

## 2024-09-13 LAB — GLUCOSE, CAPILLARY
Glucose-Capillary: 277 mg/dL — ABNORMAL HIGH (ref 70–99)
Glucose-Capillary: 292 mg/dL — ABNORMAL HIGH (ref 70–99)
Glucose-Capillary: 385 mg/dL — ABNORMAL HIGH (ref 70–99)
Glucose-Capillary: 420 mg/dL — ABNORMAL HIGH (ref 70–99)

## 2024-09-13 LAB — TACROLIMUS LEVEL: Tacrolimus (FK506) - LabCorp: 2.4 ng/mL — ABNORMAL LOW (ref 5.0–20.0)

## 2024-09-13 LAB — MISC LABCORP TEST (SEND OUT): Labcorp test code: 83935

## 2024-09-13 LAB — TRIGLYCERIDES: Triglycerides: 98 mg/dL (ref <150)

## 2024-09-13 MED ORDER — ENOXAPARIN SODIUM 40 MG/0.4ML IJ SOSY
40.0000 mg | PREFILLED_SYRINGE | INTRAMUSCULAR | Status: DC
  Administered 2024-09-13 – 2024-09-14 (×2): 40 mg via SUBCUTANEOUS
  Filled 2024-09-13 (×2): qty 0.4

## 2024-09-13 MED ORDER — INSULIN GLARGINE-YFGN 100 UNIT/ML ~~LOC~~ SOLN
20.0000 [IU] | Freq: Two times a day (BID) | SUBCUTANEOUS | Status: DC
  Administered 2024-09-13 – 2024-09-14 (×2): 20 [IU] via SUBCUTANEOUS
  Filled 2024-09-13 (×3): qty 0.2

## 2024-09-13 MED ORDER — AMLODIPINE BESYLATE 5 MG PO TABS
5.0000 mg | ORAL_TABLET | Freq: Every day | ORAL | Status: DC
  Administered 2024-09-14: 5 mg via ORAL
  Filled 2024-09-13: qty 1

## 2024-09-13 MED ORDER — INSULIN GLARGINE-YFGN 100 UNIT/ML ~~LOC~~ SOLN
10.0000 [IU] | Freq: Once | SUBCUTANEOUS | Status: DC
  Filled 2024-09-13: qty 0.1

## 2024-09-13 MED ORDER — INSULIN ASPART 100 UNIT/ML IJ SOLN
20.0000 [IU] | Freq: Once | INTRAMUSCULAR | Status: AC
  Administered 2024-09-13: 20 [IU] via SUBCUTANEOUS

## 2024-09-13 MED ORDER — INSULIN GLARGINE-YFGN 100 UNIT/ML ~~LOC~~ SOLN
10.0000 [IU] | Freq: Two times a day (BID) | SUBCUTANEOUS | Status: DC
  Filled 2024-09-13 (×2): qty 0.1

## 2024-09-13 NOTE — Progress Notes (Signed)
 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 Fell in the bathtub in few days ago. In the ED with confusion and expressive aphasia. 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 Neurosurgery consulted and recommended transfer to [facility] for craniotomy for evacuation. On arrival patient went to OR. Patient evaluated in PACU post-op. SBP 180s. Patient with aphasia but able to follow commands. Updated family who stated his recent notes at [facility] confirmed his current medical regimen and that he is compliant with therapy.  PCCM consulted for ICU admission.  Pertinent Medical History:   Past Medical History:  Diagnosis Date   Anxiety    C. difficile colitis JUN 2014   VANC x 14 DAYS   Cataract 2004   CHF (congestive heart failure) (HCC) 2000   Chronic back pain    Degenerative disc disease, lumbar 2004   Depression    Diabetes mellitus type II    GERD (gastroesophageal reflux disease)    Heart attack (HCC) 2000   High blood pressure    High cholesterol    Myocardial infarction (HCC)    several, underwent heart transplant   PONV (postoperative nausea and vomiting)    Small intestinal bacterial overgrowth OCT 2014   AUGMENTIN  FOR 5 DAYS   Transplant, organ 2000   heart   Significant Hospital Events: Including procedures, antibiotic start and stop dates in addition to other pertinent events   11/16 AP ED SDH, transferred to [facility],  s/p crani  11/17 blood sugar remained uncontrolled, remained asymptomatic  Interim History / Subjective:  No overnight issues, remained off clevidipine infusion Blood sugars are not well-controlled, JP drain was removed this morning   Objective    Blood pressure 139/87, pulse 90, temperature 98.4 F (36.9 C), temperature source Oral, resp. rate 12, height 5' 8 (1.727 m), weight 57.4 kg, SpO2 99%.        Intake/Output Summary (Last 24 hours) at 09/13/2024 1130 Last data filed at 09/13/2024 0800 Gross per 24 hour  Intake 255 ml  Output 910 ml  Net -655 ml   Filed Weights   09/11/24 0957 09/12/24 0500 09/13/24 0500  Weight: 59.4 kg 60.7 kg 57.4 kg    Examination: General: Laterally male, lying on the bed HEENT: Status post right craniotomy, eyes anicteric.  moist mucus membranes Neuro: Alert, awake following commands Chest: Coarse breath sounds, no wheezes or rhonchi Heart: Regular rate and rhythm, no murmurs or gallops Abdomen: Soft, nontender, nondistended, bowel sounds present  Labs reviewed  Resolved Problem List:   Assessment and Plan:  Subacute to chronic right subdural hemorrhage status post craniotomy and evacuation of hematoma Cerebral edema with brain compression Uncal herniation syndrome, resolved Status post heart transplant due to ischemic cardiomyopathy Hypertension Poorly controlled diabetes with hyperglycemia CKD stage IIIb  Continue neuro watch JP drain was removed Continue PT/OT evaluation Continue dexamethasone taper Continue immunosuppressive therapy Blood pressure is controlled, remain off clevidipine Increase amlodipine  to 5 mg daily, continue Coreg   Blood sugars are not well-controlled Increase Lantus to 10 units twice daily Continue sliding scale insulin  with CBG goal 140-180 Serum creatinine is stable, closely monitor    Labs:  CBC: Recent Labs  Lab 09/11/24 1009 09/11/24 2141 09/12/24 0937  WBC 7.0 6.2 13.3*  NEUTROABS 4.7   --   --   HGB 11.3* 10.2* 10.9*  HCT 37.8* 33.8* 35.3*  MCV 77.9* 76.5* 74.6*  PLT 176 191 206    Basic Metabolic Panel: Recent Labs  Lab 09/11/24 1009 09/12/24 0937  NA 136 135  K 4.8 4.6  CL 101 103  CO2 23 18*  GLUCOSE 219* 278*  BUN 20 22  CREATININE 1.95* 1.53*  CALCIUM  9.4 9.2   GFR: Estimated Creatinine Clearance: 38.6 mL/min (A) (by C-G formula based on SCr of 1.53 mg/dL (H)). Recent Labs  Lab 09/11/24 1009 09/11/24 2141 09/12/24 0937  WBC 7.0 6.2 13.3*    Liver Function Tests: Recent Labs  Lab 09/11/24 1009  AST 15  ALT <5  ALKPHOS 60  BILITOT 0.6  PROT 7.8  ALBUMIN 4.5   No results for input(s): LIPASE, AMYLASE in the last 168 hours. No results for input(s): AMMONIA in the last 168 hours.  ABG    Component Value Date/Time   TCO2 21 (L) 01/30/2024 2121     Coagulation Profile: Recent Labs  Lab 09/11/24 1009  INR 1.1    Cardiac Enzymes: No results for input(s): CKTOTAL, CKMB, CKMBINDEX, TROPONINI in the last 168 hours.  HbA1C: Hemoglobin A1C  Date/Time Value Ref Range Status  11/15/2015 12:00 AM 6.1  Final   HB A1C (BAYER DCA - WAIVED)  Date/Time Value Ref Range Status  08/10/2024 10:45 AM 6.0 (H) 4.8 - 5.6 % Final    Comment:             Prediabetes: 5.7 - 6.4          Diabetes: >6.4          Glycemic control for adults with diabetes: <7.0   05/09/2024 12:50 PM 7.4 (H) 4.8 - 5.6 % Final    Comment:             Prediabetes: 5.7 - 6.4          Diabetes: >6.4          Glycemic control for adults with diabetes: <7.0    Hgb A1c MFr Bld  Date/Time Value Ref Range Status  09/11/2024 09:41 PM 7.9 (H) 4.8 - 5.6 % Final    Comment:    (NOTE)         Prediabetes: 5.7 - 6.4         Diabetes: >6.4         Glycemic control for adults with diabetes: <7.0     CBG: Recent Labs  Lab 09/12/24 1132 09/12/24 1528 09/12/24 1727 09/12/24 2112 09/13/24 0757  GLUCAP 306* 280* 276* 293* 277*       Valinda Novas,  MD  Pulmonary Critical Care See Amion for pager If no response to pager, please call [phone] until 7pm After 7pm, Please call E-link [phone]

## 2024-09-13 NOTE — TOC Initial Note (Signed)
 Transition of Care [facility]) - Initial/Assessment Note    Patient Details  Name: Anthony Price MRN: [id number] Date of Birth: Mar 17, 1956  Transition of Care [facility] CM/SW Contact:    Adetokunbo Mccadden E Avrian Delfavero, LCSW Phone Number: 09/13/2024, 1:20 PM  Clinical Narrative:                 Patient was admitted for SDH. Patient is from home with significant other. PT has evaluated and there are no recommendations.  Patient has [facility] Medicare and Medicaid. PCP is Dr. Maryanne. No ICM needs identified at this time, ICM will continue to follow.   Expected Discharge Plan: Home/Self Care Barriers to Discharge: Continued Medical Work up   Patient Goals and CMS Choice            Expected Discharge Plan and Services       Living arrangements for the past 2 months: Single Family Home                                      Prior Living Arrangements/Services Living arrangements for the past 2 months: Single Family Home Lives with:: Significant Other Patient language and need for interpreter reviewed:: Yes        Need for Family Participation in Patient Care: Yes (Comment) Care giver support system in place?: Yes (comment)   Criminal Activity/Legal Involvement Pertinent to Current Situation/Hospitalization: No - Comment as needed  Activities of Daily Living   ADL Screening (condition at time of admission) Independently performs ADLs?: Yes (appropriate for developmental age) Is the patient deaf or have difficulty hearing?: No Does the patient have difficulty seeing, even when wearing glasses/contacts?: Yes Does the patient have difficulty concentrating, remembering, or making decisions?: No  Permission Sought/Granted                  Emotional Assessment         Alcohol / Substance Use: Not Applicable Psych Involvement: No (comment)  Admission diagnosis:  Confusion [R41.0] Subdural hematoma (HCC) [S06.5XAA] S/P craniotomy [Z98.890] Patient Active Problem List    Diagnosis Date Noted   Subdural hematoma (HCC) 09/11/2024   S/P craniotomy 09/11/2024   CKD (chronic kidney disease), stage III (HCC) 07/04/2020   Hypertriglyceridemia 07/04/2020   History of colonic polyps    History of heart transplant (HCC) 12/17/2015   Hypertension associated with diabetes (HCC) 11/15/2015   Diabetes type 2, controlled (HCC) 11/15/2015   Small intestinal bacterial overgrowth 09/15/2012   Tubular adenoma 09/15/2012   GERD (gastroesophageal reflux disease) 05/30/2012   Depression with anxiety 12/30/2011   PCP:  Dettinger, Fonda LABOR, MD Pharmacy:   [facility] - [place], [place] - [address] [place] [address] Phone: [phone] Fax: [phone]     Social Drivers of Health (SDOH) Social History: SDOH Screenings   Food Insecurity: No Food Insecurity (09/12/2024)  Housing: Low Risk  (09/12/2024)  Transportation Needs: No Transportation Needs (09/12/2024)  Recent Concern: Transportation Needs - Unmet Transportation Needs (07/20/2024)   Received from [facility]  Utilities: Not At Risk (09/12/2024)  Alcohol Screen: Low Risk  (09/30/2023)  Depression (PHQ2-9): High Risk (08/10/2024)  Financial Resource Strain: Low Risk  (09/30/2023)  Physical Activity: Sufficiently Active (09/30/2023)  Social Connections: Moderately Isolated (09/12/2024)  Stress: Stress Concern Present (09/30/2023)  Tobacco Use: Medium Risk (09/11/2024)  Health Literacy: Adequate Health Literacy (09/30/2023)   SDOH Interventions:  Readmission Risk Interventions     No data to display

## 2024-09-13 NOTE — Progress Notes (Signed)
 Subjective: Patient reports doing well, minimal headache  Objective: Vital signs in last 24 hours: Temp:  [97.6 F (36.4 C)-98.4 F (36.9 C)] 98.4 F (36.9 C) (11/18 0800) Pulse Rate:  [81-99] 90 (11/18 0700) Resp:  [12-24] 12 (11/18 0700) BP: (115-153)/(72-103) 139/87 (11/18 0700) SpO2:  [99 %-100 %] 99 % (11/18 0700) Weight:  [57.4 kg] 57.4 kg (11/18 0500)  Intake/Output from previous day: 11/17 0701 - 11/18 0700 In: 455 [P.O.:450; IV Piggyback:5] Out: 930 [Urine:850; Drains:80] Intake/Output this shift: No intake/output data recorded.  Neurologic: Grossly normal  Lab Results: Lab Results  Component Value Date   WBC 13.3 (H) 09/12/2024   HGB 10.9 (L) 09/12/2024   HCT 35.3 (L) 09/12/2024   MCV 74.6 (L) 09/12/2024   PLT 206 09/12/2024   Lab Results  Component Value Date   INR 1.1 09/11/2024   BMET Lab Results  Component Value Date   NA 135 09/12/2024   K 4.6 09/12/2024   CL 103 09/12/2024   CO2 18 (L) 09/12/2024   GLUCOSE 278 (H) 09/12/2024   BUN 22 09/12/2024   CREATININE 1.53 (H) 09/12/2024   CALCIUM  9.2 09/12/2024    Studies/Results: CT HEAD WO CONTRAST Result Date: 09/12/2024 EXAM: CT HEAD WITHOUT CONTRAST 09/12/2024 05:56:30 AM TECHNIQUE: CT of the head was performed without the administration of intravenous contrast. Automated exposure control, iterative reconstruction, and/or weight based adjustment of the mA/kV was utilized to reduce the radiation dose to as low as reasonably achievable. COMPARISON: CT of the head dated 09/11/2024. CLINICAL HISTORY: FINDINGS: BRAIN AND VENTRICLES: The patient has undergone right parietal craniotomy and placement of a subdural drain. There is a chronic right-sided subdural hematoma, which has decreased in size in the interim. There is now less mass effect upon the right cerebral hemisphere with interval decrease in shift of the midline structures to the left from 9 mm to 5 mm. There has been interval right subdural  hemorrhage. Moderate intracranial air is now present. No evidence of acute infarct. No hydrocephalus. ORBITS: No acute abnormality. SINUSES: No acute abnormality. SOFT TISSUES AND SKULL: Right parietal craniotomy and subdural drain are present. No acute soft tissue abnormality. No skull fracture. IMPRESSION: 1. Interval decrease in size of the chronic right-sided subdural hematoma and associated mass effect, with midline shift now measuring 5 mm (previously 9 mm). 2. Interval hemorrhage into the right subdural collection, but overall decreased in size. 3. Moderate intracranial air. Electronically signed by: Evalene Coho MD 09/12/2024 06:24 AM EST RP Workstation: [id number]   CT ANGIO HEAD NECK W WO CM Result Date: 09/11/2024 EXAM: CTA HEAD AND NECK WITH AND WITHOUT 09/11/2024 10:59:41 AM TECHNIQUE: CTA of the head and neck was performed with and without the administration of 60 mL of iohexol (OMNIPAQUE) 350 MG/ML intravenous contrast. Multiplanar 2D and/or 3D reformatted images are provided for review. Automated exposure control, iterative reconstruction, and/or weight based adjustment of the mA/kV was utilized to reduce the radiation dose to as low as reasonably achievable. Stenosis of the internal carotid arteries measured using NASCET criteria. COMPARISON: None available CLINICAL HISTORY: Neuro deficit, acute, stroke suspected; Confusion aphasia. FINDINGS: CTA NECK: AORTIC ARCH AND ARCH VESSELS: No dissection or arterial injury. No significant stenosis of the brachiocephalic or subclavian arteries. CERVICAL CAROTID ARTERIES: No dissection, arterial injury, or hemodynamically significant stenosis by NASCET criteria. CERVICAL VERTEBRAL ARTERIES: No dissection, arterial injury, or significant stenosis. LUNGS AND MEDIASTINUM: Unremarkable. SOFT TISSUES: No acute abnormality. BONES: No acute abnormality. CTA HEAD: ANTERIOR CIRCULATION: No significant stenosis  of the internal carotid arteries. No significant  stenosis of the anterior cerebral arteries. No significant stenosis of the middle cerebral arteries. No aneurysm. POSTERIOR CIRCULATION: No significant stenosis of the posterior cerebral arteries. No significant stenosis of the basilar artery. No significant stenosis of the vertebral arteries. No aneurysm. OTHER: No dural venous sinus thrombosis on this non-dedicated study. Traumatic brain injury risk stratification: Skull fracture: no (low - MBIG 1) Subdural hematoma (SDH): large right chronic hemispheric subdural hematoma present, which measures up to 2.4 cm in thickness and extends approximately 11 cm in AP dimension and 7.3 cm in craniocaudad length (high - MBIG 3) Subarachnoid hemorrhage [facility]): no Epidural hematoma (EDH): no (low - MBIG 1) Cerebral contusion, intra-axial, intraparenchymal hemorrhage (IPH): no Intraventricular hemorrhage (IVH): no (low - MBIG 1) Midline shift > 1mm or edema/effacement of sulci/vents: moderate mass effect upon the right cerebral hemisphere with shifted midline structures to the left by approximately 9 mm. The right cerebral sulci are largely effaced (high - MBIG 3) IMPRESSION: 1. Large right chronic hemispheric subdural hematoma measuring up to 2.4 cm in thickness with approximately 11 cm AP and 7.3 cm craniocaudal extent, causing moderate mass effect with 9 mm leftward midline shift and near-complete effacement of right cerebral sulci; calvarium intact 2. TBI risk stratification: high - mbig 3 3. Findings discussed with Dr. Lamar Pries he erson at 11:17 AM 09/11/24 Electronically signed by: Evalene Coho MD 09/11/2024 11:18 AM EST RP Workstation: [id number]    Assessment/Plan: Postop day 2 crani for SDH. Doing a lot better, talking more. We did removed his JP drain. Ok to transfer to floor from our standpoint   LOS: 2 days    Suzen Lacks [facility] 09/13/2024, 8:42 AM

## 2024-09-13 NOTE — Inpatient Diabetes Management (Signed)
 Inpatient Diabetes Program Recommendations  AACE/ADA: New Consensus Statement on Inpatient Glycemic Control  Target Ranges:  Prepandial:   less than 140 mg/dL      Peak postprandial:   less than 180 mg/dL (1-2 hours)      Critically ill patients:  140 - 180 mg/dL    Latest Reference Range & Units 09/12/24 08:21 09/12/24 11:32 09/12/24 15:28 09/12/24 17:27 09/12/24 21:12 09/13/24 07:57  Glucose-Capillary 70 - 99 mg/dL 806 (H) 693 (H) 719 (H) 276 (H) 293 (H) 277 (H)   Review of Glycemic Control  Diabetes history: DM2 Outpatient Diabetes medications: Tresiba  9 units daily, Metformin  500 mg BID, Farxiga  10 mg daily, Januvia  50 mg daily Current orders for Inpatient glycemic control: Semglee 5 units daily, Novolog  0-20 units TID with meals, Novolog  0-5 units QHS; Decadron 4 mg Q6H (tapering)   Inpatient Diabetes Program Recommendations:     Insulin : If steroids are continued as ordered, please consider increasing Semglee to 10 units daily to start 11/19 (also order Semglee 5 units x1 now for total of 10 units today), and ordering Novolog  4 units TID with meals for meal coverage if patient eats at least 50% of meals.  Thanks, Earnie Gainer, RN, MSN, CDCES Diabetes Coordinator Inpatient Diabetes Program [phone] (Team Pager from 8am to 5pm)

## 2024-09-13 NOTE — Progress Notes (Signed)
 Physical Therapy Treatment Patient Details Name: VIRL COBLE MRN: [id number] DOB: [birth year] Today's Date: 09/13/2024   History of Present Illness 68 year old male who presented 09/11/24 with AMS and expressive aphasia. Fell in the bathroom a few days ago. CTA with large right chronic subdural hematoma with mass effect and 9 mm leftward midline shift and near-complete effacement of right cerebral sulci. 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 States he thinks it is because xanax  has made him sleepy (reports recently took). No overt imbalance however not yet independent (CGA for safety). Will continue to follow anticipating xanax  may have impacted his balance.     If plan is discharge home, recommend the following:     Can travel by private vehicle        Equipment Recommendations  None recommended by PT    Recommendations for Other Services       Precautions / Restrictions Precautions Precautions: Other (comment) Recall of Precautions/Restrictions: Intact Precaution/Restrictions Comments: expressive difficulties improving Restrictions Weight Bearing Restrictions Per Provider Order: No     Mobility  Bed Mobility Overal bed mobility: Modified Independent             General bed mobility comments: HOB elevated    Transfers Overall transfer level: Independent   Transfers: Sit to/from Stand                  Ambulation/Gait Ambulation/Gait assistance: Contact guard assist Gait Distance (Feet): 200 Feet Assistive device: None Gait Pattern/deviations: Step-through pattern, Drifts right/left Gait velocity: barely able to incr velocity on command Gait velocity interpretation: 1.31 - 2.62 ft/sec,  indicative of limited community ambulator   General Gait Details: states his decr velocity is his baseline; slight drifting without imbalance with head turns   Optometrist     Tilt Bed    Modified Rankin (Stroke Patients Only) Modified Rankin (Stroke Patients Only) Pre-Morbid Rankin Score: No symptoms Modified Rankin: Moderately severe disability     Balance                                 Standardized Balance Assessment Standardized Balance Assessment : Berg Balance Test Berg Balance Test Sit to Stand: Able to stand without using hands and stabilize independently Standing Unsupported: Able to stand safely 2 minutes Sitting with Back Unsupported but Feet Supported on Floor or Stool: Able to sit safely and securely 2 minutes Stand to Sit: Sits safely with minimal use of hands Transfers: Able to transfer safely, minor use of hands Standing Unsupported with Eyes Closed: Able to stand 10 seconds safely Standing Ubsupported with Feet Together: Able to place feet together independently and stand 1 minute safely From Standing, Reach Forward with Outstretched Arm: Can reach forward >12 cm safely (5) From Standing Position, Pick up Object from Floor: Able to pick up shoe safely and easily From Standing Position, Turn to Look Behind Over each Shoulder: Looks behind from both sides and weight shifts well Turn 360 Degrees: Able to turn 360 degrees safely but slowly Standing Unsupported, Alternately Place Feet on Step/Stool: Able to complete 4 steps without aid or supervision Standing Unsupported, One Foot in Front: Able  to take small step independently and hold 30 seconds Standing on One Leg: Able to lift leg independently and hold 5-10 seconds Total Score: 48        Communication Communication Communication: Impaired Factors Affecting Communication: Difficulty expressing self  Cognition Arousal: Alert Behavior During Therapy: WFL  for tasks assessed/performed   PT - Cognitive impairments: Difficult to assess Difficult to assess due to: Impaired communication                       Following commands: Intact      Cueing Cueing Techniques: Verbal cues  Exercises      General Comments        Pertinent Vitals/Pain Pain Assessment Pain Assessment: No/denies pain    Home Living                          Prior Function            PT Goals (current goals can now be found in the care plan section) Acute Rehab PT Goals Patient Stated Goal: unable to state; agrees with PT goals Time For Goal Achievement: 09/26/24 Potential to Achieve Goals: Good Progress towards PT goals: Progressing toward goals    Frequency    Min 3X/week      PT Plan      Co-evaluation              AM-PAC PT 6 Clicks Mobility   Outcome Measure  Help needed turning from your back to your side while in a flat bed without using bedrails?: None Help needed moving from lying on your back to sitting on the side of a flat bed without using bedrails?: None Help needed moving to and from a bed to a chair (including a wheelchair)?: None Help needed standing up from a chair using your arms (e.g., wheelchair or bedside chair)?: None Help needed to walk in hospital room?: A Little Help needed climbing 3-5 steps with a railing? : A Little 6 Click Score: 22    End of Session Equipment Utilized During Treatment: Gait belt Activity Tolerance: Patient tolerated treatment well Patient left: with call bell/phone within reach;with family/visitor present;in bed   PT Visit Diagnosis: Difficulty in walking, not elsewhere classified (R26.2)     Time: 1546-1600 PT Time Calculation (min) (ACUTE ONLY): 14 min  Charges:    $Gait Training: 8-22 mins PT General Charges $$ ACUTE PT VISIT: 1 Visit                      Macario RAMAN, PT Acute Rehabilitation Services  Office [phone]    Macario SHAUNNA Soja 09/13/2024,  4:08 PM

## 2024-09-13 NOTE — Progress Notes (Signed)
 eLink Physician-Brief Progress Note Patient Name: Anthony Price DOB: [birth year] MRN: [id number]   Date of Service  09/13/2024  HPI/Events of Note  Patient is hyperglycemic this evening and has been all day.  Sugar is now 406 exceeding SSI and mealtime insulin  goals.  eICU Interventions  Continue with 20 units short acting insulin .  Increase glargine to 20 units twice daily Additional 10 units of glargine tonight for total of 20 units glargine for the evening     Intervention Category Intermediate Interventions: Hyperglycemia - evaluation and treatment  Mashelle Busick 09/13/2024, 10:00 PM

## 2024-09-14 ENCOUNTER — Other Ambulatory Visit (HOSPITAL_COMMUNITY): Payer: Self-pay

## 2024-09-14 DIAGNOSIS — S065XAA Traumatic subdural hemorrhage with loss of consciousness status unknown, initial encounter: Secondary | ICD-10-CM | POA: Diagnosis not present

## 2024-09-14 LAB — BASIC METABOLIC PANEL WITH GFR
Anion gap: 15 (ref 5–15)
BUN: 48 mg/dL — ABNORMAL HIGH (ref 8–23)
CO2: 21 mmol/L — ABNORMAL LOW (ref 22–32)
Calcium: 9.3 mg/dL (ref 8.9–10.3)
Chloride: 101 mmol/L (ref 98–111)
Creatinine, Ser: 1.9 mg/dL — ABNORMAL HIGH (ref 0.61–1.24)
GFR, Estimated: 38 mL/min — ABNORMAL LOW (ref >60)
Glucose, Bld: 313 mg/dL — ABNORMAL HIGH (ref 70–99)
Potassium: 4.6 mmol/L (ref 3.5–5.1)
Sodium: 137 mmol/L (ref 135–145)

## 2024-09-14 LAB — GLUCOSE, CAPILLARY
Glucose-Capillary: 365 mg/dL — ABNORMAL HIGH (ref 70–99)
Glucose-Capillary: 298 mg/dL — ABNORMAL HIGH (ref 70–99)

## 2024-09-14 MED ORDER — SODIUM CHLORIDE 0.45 % IV SOLN: INTRAVENOUS | Status: AC

## 2024-09-14 MED ORDER — LEVETIRACETAM 500 MG PO TABS
500.0000 mg | ORAL_TABLET | Freq: Two times a day (BID) | ORAL | 0 refills | Status: DC
  Filled 2024-09-14: qty 10, 5d supply, fill #0

## 2024-09-14 MED ORDER — DEXAMETHASONE 2 MG PO TABS
ORAL_TABLET | ORAL | 0 refills | Status: DC
  Filled 2024-09-14: qty 39, 12d supply, fill #0

## 2024-09-14 NOTE — Progress Notes (Signed)
 Physical Therapy Treatment and Discharge Patient Details Name: Anthony Price MRN: [id number] DOB: 1956-09-16 Today's Date: 09/14/2024   History of Present Illness 68 year old male who presented 09/11/24 with AMS and expressive aphasia. Fell in the bathroom a few days ago. CTA with large right chronic subdural hematoma with mass effect and 9 mm leftward midline shift and near-complete effacement of right cerebral sulci. 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 No drifting noted today x 200 ft with higher-level balance challenges. No further PT needed and will sign-off.    If plan is discharge home, recommend the following:     Can travel by private vehicle        Equipment Recommendations  None recommended by PT    Recommendations for Other Services Speech consult     Precautions / Restrictions Precautions Precautions: Other (comment) Recall of Precautions/Restrictions: Intact Precaution/Restrictions Comments: expressive difficulties improving Restrictions Weight Bearing Restrictions Per Provider Order: No     Mobility  Bed Mobility Overal bed mobility: Modified Independent             General bed mobility comments: HOB elevated    Transfers Overall transfer level: Independent Equipment used: None Transfers: Sit to/from Stand Sit to Stand: Independent                Ambulation/Gait Ambulation/Gait assistance: Independent Gait Distance (Feet): 200 Feet Assistive device: None Gait Pattern/deviations: WFL(Within Functional Limits) Gait velocity: barely able to incr velocity on command     General Gait Details: states his decr velocity is his baseline; no drift with head turns;   Stairs              Wheelchair Mobility     Tilt Bed    Modified Rankin (Stroke Patients Only) Modified Rankin (Stroke Patients Only) Pre-Morbid Rankin Score: No symptoms Modified Rankin: Moderately severe disability     Balance Overall balance assessment: Independent                               Standardized Balance Assessment Standardized Balance Assessment : Dynamic Gait Index   Dynamic Gait Index Level Surface: Normal Change in Gait Speed: Mild Impairment Gait with Horizontal Head Turns: Normal Gait with Vertical Head Turns: Normal Gait and Pivot Turn: Normal Step Over Obstacle: Normal Step Around Obstacles: Normal Steps: Mild Impairment Total Score: 22      Communication Communication Communication: Impaired Factors Affecting Communication: Difficulty expressing self  Cognition Arousal: Alert Behavior During Therapy: WFL for tasks assessed/performed   PT - Cognitive impairments: No apparent impairments Difficult to assess due to: Impaired communication                     PT - Cognition Comments: improving with some short sentences; hesitancy and word-finding diffiuculties persist Following commands: Intact      Cueing Cueing Techniques: Verbal cues  Exercises      General Comments        Pertinent Vitals/Pain Pain Assessment Pain Assessment: Faces Faces Pain Scale: Hurts a little bit Pain Location: head Pain Descriptors / Indicators: Headache Pain Intervention(s): Limited activity within patient's tolerance, Monitored during session    Home Living Family/patient expects to be discharged to:: Private residence Living Arrangements:  Spouse/significant other Available Help at Discharge: Family;Available 24 hours/day Type of Home: Mobile home Home Access: Stairs to enter Entrance Stairs-Rails: Right;Left;Can reach both Entrance Stairs-Number of Steps: 5   Home Layout: One level Home Equipment: None      Prior Function             PT Goals (current goals can now be found in the care plan section) Acute Rehab PT Goals Patient Stated Goal: unable to state; agrees with PT goals PT Goal Formulation: With patient Time For Goal Achievement: 09/26/24 Potential to Achieve Goals: Good Progress towards PT goals: Goals met/education completed, patient discharged from PT    Frequency    Min 3X/week      PT Plan      Co-evaluation              AM-PAC PT 6 Clicks Mobility   Outcome Measure  Help needed turning from your back to your side while in a flat bed without using bedrails?: None Help needed moving from lying on your back to sitting on the side of a flat bed without using bedrails?: None Help needed moving to and from a bed to a chair (including a wheelchair)?: None Help needed standing up from a chair using your arms (e.g., wheelchair or bedside chair)?: None Help needed to walk in hospital room?: None Help needed climbing 3-5 steps with a railing? : None 6 Click Score: 24    End of Session Equipment Utilized During Treatment: Gait belt Activity Tolerance: Patient tolerated treatment well Patient left: with call bell/phone within reach;with family/visitor present;in bed Nurse Communication: Mobility status;Other (comment) (need for SLP eval) PT Visit Diagnosis: Difficulty in walking, not elsewhere classified (R26.2)     Time: 8940-8880 PT Time Calculation (min) (ACUTE ONLY): 20 min  Charges:    $Gait Training: 8-22 mins PT General Charges $$ ACUTE PT VISIT: 1 Visit                      Macario RAMAN, PT Acute Rehabilitation Services  Office [phone]    Macario SHAUNNA Soja 09/14/2024, 11:34 AM

## 2024-09-14 NOTE — Progress Notes (Signed)
 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 Fell in the bathtub in few days prior to admission. In the ED with confusion and expressive aphasia. 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 Neurosurgery consulted and recommended transfer to [facility] for craniotomy for evacuation.  Patient was hospitalized for further management.  Underwent surgery.  Was initially in the ICU.  Stabilized and then transferred to the floor.     Consultants: Neurosurgery.  Critical care medicine  Procedures: Craniotomy    Subjective/Interval History: Patient sitting on the chair.  Denies any headaches nausea.  Feels well.  Asking when he would be able to go home.  Has been urinating well.    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  He is status post craniotomy and evacuation of hematoma.  Patient was also found to have cerebral edema with brain compression.  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  All of these issues appear to have stabilized. Patient remains on dexamethasone  4 mg every 8 hours. Neurological status is stable. Neurosurgery continues to follow.  His JP drain was removed by them on 11/18. Patient has been ambulating. Patient noted to be on Keppra  as well.  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  Recently done echocardiogram at [facility] showed normal LVEF. Cardiac status seems to be stable. He is  on immunosuppressants which is being continued.  Noted to be on mycophenolate  and tacrolimus .  Chronic kidney disease stage IIIb Slight increase in creatinine noted today along with increase in BUN.  Will gently hydrate.  Monitor labs.  Monitor urine output.  Avoid nephrotoxic agents.  Will hold his ACE inhibitor for now.  Diabetes mellitus type 2, uncontrolled with hyperglycemia HbA1c 7.9. Elevated glucose levels most likely due to steroids.  Continue SSI.  He is on glargine.  Essential hypertension Continue with amlodipine .  Noted to be on carvedilol  as well.  ACE inhibitor on hold.   DVT Prophylaxis: Lovenox  Code Status: Full code Family Communication: Discussed with patient Disposition Plan: Home Home when cleared by neurosurgery.  Hopefully tomorrow    Medications: Scheduled:  allopurinol   300 mg Oral Daily   amLODipine   5 mg Oral Daily   atorvastatin   40 mg Oral Daily   buPROPion   300 mg Oral Daily   carvedilol   12.5 mg Oral BID WC   Chlorhexidine  Gluconate Cloth  6 each Topical Q0600   dexamethasone  (DECADRON ) injection  4 mg Intravenous Q8H   enoxaparin  (LOVENOX ) injection  40 mg Subcutaneous Q24H   fenofibrate   54 mg Oral Daily   insulin  aspart  0-20 Units Subcutaneous TID WC   insulin  aspart  0-5 Units Subcutaneous QHS   insulin  glargine-yfgn  20 Units Subcutaneous BID   levETIRAcetam   500 mg Oral BID   magnesium  oxide  400 mg Oral Daily   mycophenolate   250 mg Oral BID   pantoprazole   40 mg Oral Daily   senna  1 tablet Oral BID   tacrolimus   0.5 mg Oral BID  Vilazodone  HCl  20 mg Oral Daily   Continuous:  sodium chloride  75 mL/hr at 09/14/24 9060   EMW:jrzujfpwneyzw **OR** acetaminophen, ALPRAZolam , hydrALAZINE, HYDROcodone-acetaminophen, labetalol, labetalol, ondansetron  **OR** ondansetron  (ZOFRAN ) IV, mouth rinse, polyethylene glycol, senna  Antibiotics: Anti-infectives (From admission, onward)    Start     Dose/Rate Route Frequency Ordered Stop    09/11/24 2315  cefadroxil  (DURICEF) capsule 500 mg  Status:  Discontinued        500 mg Oral 2 times daily 09/11/24 2218 09/11/24 2250       Objective:  Vital Signs  Vitals:   09/14/24 0600 09/14/24 0700 09/14/24 0800 09/14/24 0900  BP: (!) 140/76 (!) 125/105 (!) 141/82 133/75  Pulse: 81 90 90 93  Resp: 16 14 14 17   Temp:   97.8 F (36.6 C)   TempSrc:   Axillary   SpO2: 98% 100% 100% 99%  Weight:      Height:       No intake or output data in the 24 hours ending 09/14/24 0958 Filed Weights   09/11/24 0957 09/12/24 0500 09/13/24 0500  Weight: 59.4 kg 60.7 kg 57.4 kg    General appearance: Awake alert.  In no distress Resp: Clear to auscultation bilaterally.  Normal effort Cardio: S1-S2 is normal regular.  No S3-S4.  No rubs murmurs or bruit GI: Abdomen is soft.  Nontender nondistended.  Bowel sounds are present normal.  No masses organomegaly Extremities: No edema.  Full range of motion of lower extremities. Neurologic: Alert and oriented x3.  No focal neurological deficits.    Lab Results:  Data Reviewed: I have personally reviewed following labs and reports of the imaging studies  CBC: Recent Labs  Lab 09/11/24 1009 09/11/24 2141 09/12/24 0937  WBC 7.0 6.2 13.3*  NEUTROABS 4.7  --   --   HGB 11.3* 10.2* 10.9*  HCT 37.8* 33.8* 35.3*  MCV 77.9* 76.5* 74.6*  PLT 176 191 206    Basic Metabolic Panel: Recent Labs  Lab 09/11/24 1009 09/12/24 0937 09/14/24 0227  NA 136 135 137  K 4.8 4.6 4.6  CL 101 103 101  CO2 23 18* 21*  GLUCOSE 219* 278* 313*  BUN 20 22 48*  CREATININE 1.95* 1.53* 1.90*  CALCIUM  9.4 9.2 9.3    GFR: Estimated Creatinine Clearance: 31 mL/min (A) (by C-G formula based on SCr of 1.9 mg/dL (H)).  Liver Function Tests: Recent Labs  Lab 09/11/24 1009  AST 15  ALT <5  ALKPHOS 60  BILITOT 0.6  PROT 7.8  ALBUMIN 4.5    Coagulation Profile: Recent Labs  Lab 09/11/24 1009  INR 1.1    HbA1C: Recent Labs    09/11/24 2141   HGBA1C 7.9*    CBG: Recent Labs  Lab 09/13/24 0757 09/13/24 1141 09/13/24 1636 09/13/24 2144 09/14/24 0827  GLUCAP 277* 292* 385* 420* 365*    Lipid Profile: Recent Labs    09/13/24 0246  TRIG 98     Recent Results (from the past 240 hours)  MRSA Next Gen by PCR, Nasal     Status: None   Collection Time: 09/11/24  3:46 PM   Specimen: Nasal Mucosa; Nasal Swab  Result Value Ref Range Status   MRSA by PCR Next Gen NOT DETECTED NOT DETECTED Final    Comment: (NOTE) The GeneXpert MRSA Assay (FDA approved for NASAL specimens only), is one component of a comprehensive MRSA colonization surveillance program. It is not intended to diagnose MRSA infection nor to guide  or monitor treatment for MRSA infections. Test performance is not FDA approved in patients less than 65 years old. Performed at [facility], [address] [place], [place] [address]       Radiology Studies: No results found.     LOS: 3 days   Harleyquinn Gasser [employer] on [email or web address]  09/14/2024, 9:58 AM

## 2024-09-14 NOTE — Progress Notes (Signed)
 Discharge order in- told by physician to wait until bag of fluids is finished to go home.   Education for medications given to patient.
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
## 2024-09-14 NOTE — Progress Notes (Signed)
 Subjective: Patient reports doing well, no headaches  Objective: Vital signs in last 24 hours: Temp:  [97.6 F (36.4 C)-98.6 F (37 C)] 97.8 F (36.6 C) (11/19 0800) Pulse Rate:  [79-99] 93 (11/19 0900) Resp:  [12-22] 17 (11/19 0900) BP: (107-143)/(60-105) 133/75 (11/19 0900) SpO2:  [97 %-100 %] 99 % (11/19 0900)  Intake/Output from previous day: No intake/output data recorded. Intake/Output this shift: No intake/output data recorded.  Neurologic: Grossly normal  Lab Results: Lab Results  Component Value Date   WBC 13.3 (H) 09/12/2024   HGB 10.9 (L) 09/12/2024   HCT 35.3 (L) 09/12/2024   MCV 74.6 (L) 09/12/2024   PLT 206 09/12/2024   Lab Results  Component Value Date   INR 1.1 09/11/2024   BMET Lab Results  Component Value Date   NA 137 09/14/2024   K 4.6 09/14/2024   CL 101 09/14/2024   CO2 21 (L) 09/14/2024   GLUCOSE 313 (H) 09/14/2024   BUN 48 (H) 09/14/2024   CREATININE 1.90 (H) 09/14/2024   CALCIUM  9.3 09/14/2024    Studies/Results: No results found.  Assessment/Plan: Postop day 3 crani for SDH. Doing really well. Ok to discharge home today from our perspective. Follow up in office in 2 weeks   LOS: 3 days    Anthony Price 09/14/2024, 11:24 AM

## 2024-09-14 NOTE — Discharge Summary (Signed)
 [facility]  Physician Discharge Summary   Patient ID: Anthony Price MRN: [id number] DOB/AGE: Jan 08, 1956 68 y.o.  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 Fell in the bathtub in few days prior to admission. In the ED with confusion and expressive aphasia. 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 Neurosurgery consulted and recommended transfer to [facility] for craniotomy for evacuation.  Patient was hospitalized for further management.  Underwent surgery.  Was initially in the ICU.  Stabilized and then transferred to the floor.      Consultants: Neurosurgery.  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  He is status post craniotomy and evacuation of hematoma.  Patient was also found to have cerebral edema with brain compression.  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  All of these issues appear to have stabilized. Neurological status is stable. His JP drain was removed by on 11/18. Patient has been ambulating. Patient noted to be on Keppra as well. Dexamethasone taper.   History of heart transplant due to ischemic cardiomyopathy Followed at  [facility].  Recently done echocardiogram at [facility] showed normal LVEF. Cardiac status seems to be stable. He is on immunosuppressants which is being continued.  Noted to be on mycophenolate and tacrolimus .   Chronic kidney disease stage IIIb Slight increase in creatinine noted today along with increase in BUN.  Gently hydrated.  Stable compared to previous values.   Diabetes mellitus type 2, uncontrolled with hyperglycemia HbA1c 7.9.   Essential hypertension   Ok for discharge today. Patient cleared by neurosurgery.   PERTINENT LABS:  The results of significant diagnostics from this hospitalization (including imaging, microbiology, ancillary and laboratory) are listed below for reference.    Microbiology: Recent Results (from the past 240 hours)  MRSA Next Gen by PCR, Nasal     Status: None   Collection Time: 09/11/24  3:46 PM   Specimen: Nasal Mucosa; Nasal Swab  Result Value Ref Range Status   MRSA by PCR Next Gen NOT DETECTED NOT DETECTED Final    Comment: (NOTE) The GeneXpert MRSA Assay (FDA approved for NASAL specimens only), is one component of a comprehensive MRSA colonization surveillance program. It is not intended to diagnose MRSA infection nor to guide or monitor treatment for MRSA infections. Test performance is not FDA approved in patients less than 58 years old. Performed at [facility], [address] [place], [place] [address]      Labs:   Basic Metabolic Panel: Recent Labs  Lab 09/11/24 1009 09/12/24 0937 09/14/24 0227  NA 136 135 137  K 4.8 4.6 4.6  CL 101 103 101  CO2 23 18* 21*  GLUCOSE 219* 278* 313*  BUN 20 22 48*  CREATININE 1.95* 1.53* 1.90*  CALCIUM  9.4 9.2 9.3   Liver Function Tests: Recent Labs  Lab 09/11/24 1009  AST 15  ALT <5  ALKPHOS 60  BILITOT 0.6  PROT 7.8  ALBUMIN 4.5    CBC: Recent Labs  Lab 09/11/24 1009 09/11/24 2141 09/12/24 0937  WBC 7.0 6.2 13.3*  NEUTROABS 4.7  --   --   HGB 11.3* 10.2* 10.9*  HCT  37.8* 33.8* 35.3*  MCV 77.9* 76.5* 74.6*  PLT 176 191 206    CBG: Recent Labs  Lab 09/13/24 1141 09/13/24 1636 09/13/24 2144 09/14/24 0827 09/14/24 1126  GLUCAP 292* 385* 420* 365* 298*     IMAGING STUDIES CT HEAD WO CONTRAST Result Date: 09/12/2024 EXAM: CT HEAD WITHOUT CONTRAST 09/12/2024 05:56:30 AM TECHNIQUE: CT of the head was performed without the administration of intravenous contrast. Automated exposure control, iterative reconstruction, and/or weight based adjustment of the mA/kV was utilized to reduce the radiation dose to as low as reasonably achievable. COMPARISON: CT of the head dated 09/11/2024. CLINICAL HISTORY: FINDINGS: BRAIN AND VENTRICLES: The patient has undergone right parietal craniotomy and placement of a subdural drain. There is a chronic right-sided subdural hematoma, which has decreased in size in the interim. There is now less mass effect upon the right cerebral hemisphere with interval decrease in shift of the midline structures to the left from 9 mm to 5 mm. There has been interval right subdural hemorrhage. Moderate intracranial air is now present. No evidence of acute infarct. No hydrocephalus. ORBITS: No acute abnormality. SINUSES: No acute abnormality. SOFT TISSUES AND SKULL: Right parietal craniotomy and subdural drain are present. No acute soft tissue abnormality. No skull fracture. IMPRESSION: 1. Interval decrease in size of the chronic right-sided subdural hematoma and associated mass effect, with midline shift now measuring 5 mm (previously 9 mm). 2. Interval hemorrhage into the right subdural collection, but overall decreased in size. 3. Moderate intracranial air. Electronically signed by: Evalene Coho MD 09/12/2024 06:24 AM EST RP Workstation: [id number]   CT ANGIO HEAD NECK W WO CM Result Date: 09/11/2024 EXAM: CTA HEAD AND NECK WITH AND WITHOUT 09/11/2024 10:59:41 AM TECHNIQUE: CTA of the head and neck was performed with and without the  administration of 60 mL of iohexol (OMNIPAQUE) 350 MG/ML intravenous contrast. Multiplanar 2D and/or 3D reformatted images are provided for review. Automated exposure control, iterative reconstruction, and/or weight based adjustment of the mA/kV was utilized to reduce the radiation dose to as low as reasonably achievable. Stenosis of the internal carotid arteries measured using NASCET criteria. COMPARISON: None available CLINICAL HISTORY: Neuro deficit, acute, stroke suspected; Confusion aphasia. FINDINGS: CTA NECK: AORTIC ARCH AND ARCH VESSELS: No dissection or arterial injury. No significant stenosis of the brachiocephalic or subclavian arteries. CERVICAL CAROTID ARTERIES: No dissection, arterial injury, or hemodynamically significant stenosis by NASCET criteria. CERVICAL VERTEBRAL ARTERIES: No dissection, arterial injury, or significant stenosis. LUNGS AND MEDIASTINUM: Unremarkable. SOFT TISSUES: No acute abnormality. BONES: No acute abnormality. CTA HEAD: ANTERIOR CIRCULATION: No significant stenosis of the internal carotid arteries. No significant stenosis of the anterior cerebral arteries. No significant stenosis of the middle cerebral arteries. No aneurysm. POSTERIOR CIRCULATION: No significant stenosis of the posterior cerebral arteries. No significant stenosis of the basilar artery. No significant stenosis of the vertebral arteries. No aneurysm. OTHER: No dural venous sinus thrombosis on this non-dedicated study. Traumatic brain injury risk stratification: Skull fracture: no (low - MBIG 1) Subdural hematoma (SDH): large right chronic hemispheric subdural hematoma present, which measures up to 2.4 cm in thickness  and extends approximately 11 cm in AP dimension and 7.3 cm in craniocaudad length (high - MBIG 3) Subarachnoid hemorrhage [facility]): no Epidural hematoma (EDH): no (low - MBIG 1) Cerebral contusion, intra-axial, intraparenchymal hemorrhage (IPH): no Intraventricular hemorrhage (IVH): no (low - MBIG 1)  Midline shift > 1mm or edema/effacement of sulci/vents: moderate mass effect upon the right cerebral hemisphere with shifted midline structures to the left by approximately 9 mm. The right cerebral sulci are largely effaced (high - MBIG 3) IMPRESSION: 1. Large right chronic hemispheric subdural hematoma measuring up to 2.4 cm in thickness with approximately 11 cm AP and 7.3 cm craniocaudal extent, causing moderate mass effect with 9 mm leftward midline shift and near-complete effacement of right cerebral sulci; calvarium intact 2. TBI risk stratification: high - mbig 3 3. Findings discussed with Dr. Lamar Pries he erson at 11:17 AM 09/11/24 Electronically signed by: Evalene Coho MD 09/11/2024 11:18 AM EST RP Workstation: [id number]    DISCHARGE EXAMINATION: See PN from earlier today.  DISPOSITION: Home  Discharge Instructions     Call MD for:  difficulty breathing, headache or visual disturbances   Complete by: As directed    Call MD for:  extreme fatigue   Complete by: As directed    Call MD for:  hives   Complete by: As directed    Call MD for:  persistant dizziness or light-headedness   Complete by: As directed    Call MD for:  persistant nausea and vomiting   Complete by: As directed    Call MD for:  redness, tenderness, or signs of infection (pain, swelling, redness, odor or green/yellow discharge around incision site)   Complete by: As directed    Call MD for:  severe uncontrolled pain   Complete by: As directed    Call MD for:  temperature >100.4   Complete by: As directed    Diet - low sodium heart healthy   Complete by: As directed    Discharge instructions   Complete by: As directed    Please follow-up with the neurosurgeon as instructed.  You were cared for by a hospitalist during your hospital stay. If you have any questions about your discharge medications or the care you received while you were in the hospital after you are discharged, you can call the unit and asked  to speak with the hospitalist on call if the hospitalist that took care of you is not available. Once you are discharged, your primary care physician will handle any further medical issues. Please note that NO REFILLS for any discharge medications will be authorized once you are discharged, as it is imperative that you return to your primary care physician (or establish a relationship with a primary care physician if you do not have one) for your aftercare needs so that they can reassess your need for medications and monitor your lab values. If you do not have a primary care physician, you can call [phone] for a physician referral.   Discharge wound care:   Complete by: As directed    As instructed by neurosurgery.   Increase activity slowly   Complete by: As directed          Allergies as of 09/14/2024       Reactions   Lactose Intolerance (gi) Other (See Comments)   Bloating    Tramadol Nausea Only        Medication List     PAUSE taking these medications    aspirin EC 81  MG tablet Wait to take this until your doctor or other care provider tells you to start again. Take 81 mg by mouth daily.       STOP taking these medications    ramipril  2.5 MG capsule Commonly known as: ALTACE        TAKE these medications    acetaminophen  500 MG tablet Commonly known as: TYLENOL  Take 1,000 mg by mouth every 6 (six) hours as needed for moderate pain. Pain   ALPRAZolam  1 MG tablet Commonly known as: XANAX  TAKE (1) TABLET BY MOUTH THREE TIMES DAILY.   amLODipine  2.5 MG tablet Commonly known as: NORVASC  Take 1 tablet (2.5 mg total) by mouth daily.   atorvastatin  40 MG tablet Commonly known as: LIPITOR Take 1 tablet (40 mg total) by mouth daily.   buPROPion  300 MG 24 hr tablet Commonly known as: WELLBUTRIN  XL TAKE 1 TABLET BY MOUTH ONCE EVERY MORNING.   carvedilol  12.5 MG tablet Commonly known as: COREG  TAKE (1) TABLET BY MOUTH TWICE DAILY.   dapagliflozin   propanediol 10 MG Tabs tablet Commonly known as: Farxiga  Take 1 tablet (10 mg total) by mouth daily before breakfast.   dexamethasone  2 MG tablet Commonly known as: DECADRON  Take 2 tablets three times daily for 3 days,. Then take 2 tablets twice daily for 3 days, then take 1 tablet twice daily for 3 days, then take 1 tablet once daily for 3 days and then STOP.   esomeprazole  20 MG capsule Commonly known as: NEXIUM  TAKE (1) CAPSULE BY MOUTH TWICE DAILYBEFORE A MEAL   fenofibrate  48 MG tablet Commonly known as: TRICOR  Take 1 tablet (48 mg total) by mouth daily.   Januvia  50 MG tablet Generic drug: sitaGLIPtin  TAKE ONE TABLET BY MOUTH EVERY DAY   levETIRAcetam  500 MG tablet Commonly known as: KEPPRA  Take 1 tablet (500 mg total) by mouth 2 (two) times daily for 5 days.   magnesium  30 MG tablet Take 30 mg by mouth daily.   metFORMIN  500 MG tablet Commonly known as: GLUCOPHAGE  Take 1 tablet (500 mg total) by mouth 2 (two) times daily with a meal.   mupirocin  ointment 2 % Commonly known as: BACTROBAN  Apply 1 Application topically 2 (two) times daily. What changed:  when to take this reasons to take this   mycophenolate  250 MG capsule Commonly known as: CELLCEPT  Take 250 mg by mouth 2 (two) times daily.   Omega-3 1000 MG Caps Take 1,000 mg by mouth in the morning and at bedtime.   PROBIOTIC DAILY PO Take 1 capsule by mouth daily.   sildenafil  20 MG tablet Commonly known as: REVATIO  Take 1-5 tablets (20-100 mg total) by mouth daily as needed.   tacrolimus  0.5 MG capsule Commonly known as: PROGRAF  Take 0.5 mg by mouth 2 (two) times daily.   Tresiba  FlexTouch 100 UNIT/ML FlexTouch Pen Generic drug: insulin  degludec Inject 9 Units into the skin daily. Inject 9 Units into the skin daily.   Vilazodone  HCl 20 MG Tabs Commonly known as: Viibryd  Take 1 tablet (20 mg total) by mouth daily.               Discharge Care Instructions  (From admission, onward)            Start     Ordered   09/14/24 0000  Discharge wound care:       Comments: As instructed by neurosurgery.   09/14/24 1135              Follow-up  Information     Joshua Alm Hamilton, MD. Schedule an appointment as soon as possible for a visit in 2 week(s).   Specialty: Neurosurgery Contact information: [address] [place] [address] [phone]                 TOTAL DISCHARGE TIME: 35 mins  Matthe Sloane Verdene  [facility] [email or web address]  09/15/2024, 11:37 AM

## 2024-09-14 NOTE — Plan of Care (Signed)
  Problem: Education: Goal: Knowledge of General Education information will improve Description: Including pain rating scale, medication(s)/side effects and non-pharmacologic comfort measures Outcome: Progressing   Problem: Health Behavior/Discharge Planning: Goal: Ability to manage health-related needs will improve Outcome: Progressing   Problem: Clinical Measurements: Goal: Ability to maintain clinical measurements within normal limits will improve Outcome: Progressing Goal: Will remain free from infection Outcome: Progressing Goal: Diagnostic test results will improve Outcome: Progressing Goal: Respiratory complications will improve Outcome: Progressing Goal: Cardiovascular complication will be avoided Outcome: Progressing   Problem: Activity: Goal: Risk for activity intolerance will decrease Outcome: Progressing   Problem: Nutrition: Goal: Adequate nutrition will be maintained Outcome: Progressing   Problem: Coping: Goal: Level of anxiety will decrease Outcome: Progressing   Problem: Elimination: Goal: Will not experience complications related to bowel motility Outcome: Progressing Goal: Will not experience complications related to urinary retention Outcome: Progressing   Problem: Pain Managment: Goal: General experience of comfort will improve and/or be controlled Outcome: Progressing   Problem: Safety: Goal: Ability to remain free from injury will improve Outcome: Progressing   Problem: Skin Integrity: Goal: Risk for impaired skin integrity will decrease Outcome: Progressing   Problem: Education: Goal: Ability to describe self-care measures that may prevent or decrease complications (Diabetes Survival Skills Education) will improve Outcome: Progressing Goal: Individualized Educational Video(s) Outcome: Progressing   Problem: Coping: Goal: Ability to adjust to condition or change in health will improve Outcome: Progressing   Problem: Fluid  Volume: Goal: Ability to maintain a balanced intake and output will improve Outcome: Progressing   Problem: Health Behavior/Discharge Planning: Goal: Ability to identify and utilize available resources and services will improve Outcome: Progressing Goal: Ability to manage health-related needs will improve Outcome: Progressing   Problem: Metabolic: Goal: Ability to maintain appropriate glucose levels will improve Outcome: Progressing   Problem: Nutritional: Goal: Maintenance of adequate nutrition will improve Outcome: Progressing Goal: Progress toward achieving an optimal weight will improve Outcome: Progressing   Problem: Skin Integrity: Goal: Risk for impaired skin integrity will decrease Outcome: Progressing   Problem: Tissue Perfusion: Goal: Adequacy of tissue perfusion will improve Outcome: Progressing   Problem: Education: Goal: Knowledge of the prescribed therapeutic regimen will improve Outcome: Progressing   Problem: Clinical Measurements: Goal: Usual level of consciousness will be regained or maintained. Outcome: Progressing Goal: Neurologic status will improve Outcome: Progressing Goal: Ability to maintain intracranial pressure will improve Outcome: Progressing   Problem: Skin Integrity: Goal: Demonstration of wound healing without infection will improve Outcome: Progressing

## 2024-09-15 ENCOUNTER — Telehealth: Payer: Self-pay | Admitting: *Deleted

## 2024-09-15 ENCOUNTER — Telehealth: Payer: Self-pay | Admitting: Family Medicine

## 2024-09-15 MED ORDER — BLOOD GLUCOSE MONITORING SUPPL DEVI: 1.0000 | 0 refills | Status: DC

## 2024-09-15 NOTE — Transitions of Care (Post Inpatient/ED Visit) (Signed)
 09/15/2024  Name: Anthony Price MRN: [id number] DOB: Feb 16, 1956  Today's TOC FU Call Status: Today's TOC FU Call Status:: Successful TOC FU Call Completed TOC FU Call Complete Date: 09/15/24  Patient's Name and Date of Birth confirmed. Name, DOB  Transition Care Management Follow-up Telephone Call Date of Discharge: 09/14/24 Discharge Facility: [facility]) Type of Discharge: Inpatient Admission Primary Inpatient Discharge Diagnosis:: Subdural hematoma  s/p craniotomy How have you been since you were released from the hospital?: Better (eating, drinking well, ambulating without difficulty) Any questions or concerns?: No  Items Reviewed: Did you receive and understand the discharge instructions provided?: Yes Medications obtained,verified, and reconciled?: Yes (Medications Reviewed) Any new allergies since your discharge?: No Dietary orders reviewed?: Yes Type of Diet Ordered:: low sodium Do you have support at home?: Yes People in Home [RPT]: significant other Name of Support/Comfort Primary Source: pt does not share name of support person  Medications Reviewed Today: Medications Reviewed Today     Reviewed by Aura Mliss LABOR, RN (Registered Nurse) on 09/15/24 at 1133  Med List Status: <None>   Medication Order Taking? Sig Documenting Provider Last Dose Status Informant  acetaminophen (TYLENOL) 500 MG tablet [id number] Yes Take 1,000 mg by mouth every 6 (six) hours as needed for moderate pain. Pain [provider]  Active Self  ALPRAZolam  (XANAX ) 1 MG tablet [id number] Yes TAKE (1) TABLET BY MOUTH THREE TIMES DAILY. Okey Barnie SAUNDERS, MD  Active Self  amLODipine  (NORVASC ) 2.5 MG tablet [id number] Yes Take 1 tablet (2.5 mg total) by mouth daily. Dettinger, Fonda LABOR, MD  Active Self  aspirin EC 81 MG tablet [id number]  Take 81 mg by mouth daily.  Patient not taking: Reported on 09/15/2024   [provider]  Active Self  atorvastatin  (LIPITOR) 40 MG tablet  [id number] Yes Take 1 tablet (40 mg total) by mouth daily. Dettinger, Fonda LABOR, MD  Active Self  buPROPion  (WELLBUTRIN  XL) 300 MG 24 hr tablet [id number] Yes TAKE 1 TABLET BY MOUTH ONCE EVERY MORNING. Okey Barnie SAUNDERS, MD  Active Self  carvedilol  (COREG ) 12.5 MG tablet [id number] Yes TAKE (1) TABLET BY MOUTH TWICE DAILY. Dettinger, Fonda LABOR, MD  Active Self           Med Note JACKOLYN WADDELL VEAR Pablo Sep 12, 2024 12:43 PM) Specific date unknown.  dapagliflozin  propanediol (FARXIGA ) 10 MG TABS tablet [id number] Yes Take 1 tablet (10 mg total) by mouth daily before breakfast. Dettinger, Fonda LABOR, MD  Active Self  dexamethasone (DECADRON) 2 MG tablet [id number] Yes Take 2 tablets three times daily for 3 days,. Then take 2 tablets twice daily for 3 days, then take 1 tablet twice daily for 3 days, then take 1 tablet once daily for 3 days and then STOP. Krishnan, Gokul, MD  Active   esomeprazole  (NEXIUM ) 20 MG capsule [id number] Yes TAKE (1) CAPSULE BY MOUTH TWICE DAILYBEFORE A MEAL Dettinger, Fonda LABOR, MD  Active Self  fenofibrate  (TRICOR ) 48 MG tablet [id number] Yes Take 1 tablet (48 mg total) by mouth daily. Dettinger, Fonda LABOR, MD  Active Self  insulin  degludec (TRESIBA  FLEXTOUCH) 100 UNIT/ML FlexTouch Pen [id number] Yes Inject 9 Units into the skin daily. Inject 9 Units into the skin daily. Dettinger, Fonda LABOR, MD  Active Self  levETIRAcetam (KEPPRA) 500 MG tablet [id number] Yes Take 1 tablet (500 mg total) by mouth 2 (two) times daily for 5 days. Krishnan, Gokul, MD  Active   magnesium  30 MG tablet [id number] Yes  Take 30 mg by mouth daily. [provider]  Active Self  metFORMIN  (GLUCOPHAGE ) 500 MG tablet [id number] Yes Take 1 tablet (500 mg total) by mouth 2 (two) times daily with a meal. Dettinger, Fonda LABOR, MD  Active Self  mupirocin  ointment (BACTROBAN ) 2 % [id number] Yes Apply 1 Application topically 2 (two) times daily. Silver Wonda LABOR, [place]  Active Self  mycophenolate  (CELLCEPT ) 250 MG capsule  [id number] Yes Take 250 mg by mouth 2 (two) times daily. [provider]  Active Self  Omega-3 1000 MG CAPS [id number] Yes Take 1,000 mg by mouth in the morning and at bedtime. [provider]  Active Self  Probiotic Product (PROBIOTIC DAILY PO) [id number]  Take 1 capsule by mouth daily.   Patient not taking: Reported on 09/15/2024   [provider]  Active Self  sildenafil  (REVATIO ) 20 MG tablet [id number] Yes Take 1-5 tablets (20-100 mg total) by mouth daily as needed. Dettinger, Fonda LABOR, MD  Active Self  sitaGLIPtin  (JANUVIA ) 50 MG tablet [id number] Yes TAKE ONE TABLET BY MOUTH EVERY DAY Dettinger, Fonda LABOR, MD  Active Self  tacrolimus  (PROGRAF ) 0.5 MG capsule [id number] Yes Take 0.5 mg by mouth 2 (two) times daily. [provider]  Active Self  Vilazodone  HCl (VIIBRYD ) 20 MG TABS [id number] Yes Take 1 tablet (20 mg total) by mouth daily. Okey Barnie SAUNDERS, MD  Active Self            Home Care and Equipment/Supplies: Were Home Health Services Ordered?: No Any new equipment or medical supplies ordered?: No  Functional Questionnaire: Do you need assistance with bathing/showering or dressing?: No Do you need assistance with meal preparation?: No Do you need assistance with eating?: No Do you have difficulty maintaining continence: No Do you need assistance with getting out of bed/getting out of a chair/moving?: No Do you have difficulty managing or taking your medications?: No  Follow up appointments reviewed: PCP Follow-up appointment confirmed?: No (pt states he will keep appointment for 11/16/24, declines for RN CM to schedule) MD Provider Line Number:[phone] Given: No Specialist Hospital Follow-up appointment confirmed?: No (neurosurgeon Alm Molt MD- pt has contact #, will follow up next week if they do not call him this week) Reason Specialist Follow-Up Not Confirmed: Patient has Specialist Provider Number and will Call for Appointment Do you  need transportation to your follow-up appointment?: No Do you understand care options if your condition(s) worsen?: Yes-patient verbalized understanding  SDOH Interventions Today    Flowsheet Row Most Recent Value  SDOH Interventions   Food Insecurity Interventions Intervention Not Indicated  Housing Interventions Intervention Not Indicated  Transportation Interventions Intervention Not Indicated  Utilities Interventions Intervention Not Indicated    Goals Addressed             This Visit's Progress    VBCI Transitions of Care (TOC) Care Plan       Problems:  Recent Hospitalization for treatment of subdural hematoma/  s/p craniotomy No Hospital Follow Up Provider appointment pt states he will keep appointment 11/16/24, declines for RN CM to schedule and No Specialist appointment pt states if Alm Molt neurosurgeon does not call this week, pt will follow up and make post hospital follow up appointment,  pt has contact #  Goal:  Over the next 30 days, the patient will not experience hospital readmission  Interventions:  Transitions of Care:  subdural hematoma/ s/p craniotomy Post-op wound/incision care reviewed with patient/caregiver Reviewed Signs and symptoms of infection Evaluation of current  treatment plan related to subdural hematoma/ s/p craniotomy,  self-management and patient's adherence to plan as established by provider. Discussed plans with patient for ongoing care management follow up and provided patient with direct contact information for care management team Evaluation of current treatment plan related to subdural hematoma/ s/p craniotomy and patient's adherence to plan as established by provider Provided education to patient re: no heavy lifting, observing incision for signs of infection, reportable signs /symptoms Discussed plans with patient for ongoing care management follow up and provided patient with direct contact information for care management  team Screening for signs and symptoms of depression related to chronic disease state  Assessed social determinant of health barriers Reviewed medications and importance of taking as prescribed Provided contact # for neurosurgeon Dr. Alm Molt  Patient Self Care Activities:  Attend all scheduled provider appointments Call pharmacy for medication refills 3-7 days in advance of running out of medications Call provider office for new concerns or questions  Notify RN Care Manager of [facility] call rescheduling needs Participate in Transition of Care Program/Attend [facility] scheduled calls Take medications as prescribed   Contact # Alm Molt neurosurgeon [phone]  Plan:  Telephone follow up appointment with care management team member scheduled for:  09/20/24 @ 930 am The patient has been provided with contact information for the care management team and has been advised to call with any health related questions or concerns.         Mliss Creed [facility], BSN RN Care Manager/ Transition of Care [place]/ [facility] [phone]

## 2024-09-15 NOTE — Telephone Encounter (Signed)
 Spoke with pt. He states that his old machine was [employer] brand.  Refill sent  [employer]. Pt aware.

## 2024-09-15 NOTE — Telephone Encounter (Signed)
 Copied from CRM [id number]. Topic: Clinical - Order For Equipment >> Sep 15, 2024 10:58 AM Terri MATSU wrote: Reason for CRM: Patient stated his blood monitor fell apart this morning so can Dr.Dettinger order him a new one asap please and can you can call him to let him know when the order has been placed please. Callback number [phone]

## 2024-09-16 LAB — TACROLIMUS LEVEL: Tacrolimus (FK506) - LabCorp: 2.3 ng/mL — ABNORMAL LOW (ref 5.0–20.0)

## 2024-09-20 ENCOUNTER — Encounter: Payer: Self-pay | Admitting: *Deleted

## 2024-09-20 ENCOUNTER — Telehealth: Admitting: *Deleted

## 2024-09-21 ENCOUNTER — Encounter: Payer: Self-pay | Admitting: *Deleted

## 2024-09-30 ENCOUNTER — Emergency Department (HOSPITAL_COMMUNITY)

## 2024-09-30 ENCOUNTER — Other Ambulatory Visit: Payer: Self-pay

## 2024-09-30 ENCOUNTER — Inpatient Hospital Stay (HOSPITAL_COMMUNITY)
Admission: EM | Admit: 2024-09-30 | Discharge: 2024-10-08 | Disposition: A | Source: Home / Self Care | Attending: Internal Medicine | Admitting: Internal Medicine

## 2024-09-30 ENCOUNTER — Encounter (HOSPITAL_COMMUNITY): Payer: Self-pay

## 2024-09-30 DIAGNOSIS — Z9889 Other specified postprocedural states: Secondary | ICD-10-CM

## 2024-09-30 DIAGNOSIS — R29898 Other symptoms and signs involving the musculoskeletal system: Principal | ICD-10-CM

## 2024-09-30 DIAGNOSIS — I152 Hypertension secondary to endocrine disorders: Secondary | ICD-10-CM | POA: Diagnosis present

## 2024-09-30 DIAGNOSIS — F418 Other specified anxiety disorders: Secondary | ICD-10-CM | POA: Diagnosis present

## 2024-09-30 DIAGNOSIS — E781 Pure hyperglyceridemia: Secondary | ICD-10-CM | POA: Diagnosis present

## 2024-09-30 DIAGNOSIS — E876 Hypokalemia: Secondary | ICD-10-CM | POA: Diagnosis present

## 2024-09-30 DIAGNOSIS — Z794 Long term (current) use of insulin: Secondary | ICD-10-CM

## 2024-09-30 DIAGNOSIS — R569 Unspecified convulsions: Secondary | ICD-10-CM

## 2024-09-30 DIAGNOSIS — K219 Gastro-esophageal reflux disease without esophagitis: Secondary | ICD-10-CM | POA: Diagnosis present

## 2024-09-30 DIAGNOSIS — Z941 Heart transplant status: Secondary | ICD-10-CM

## 2024-09-30 DIAGNOSIS — S065XAA Traumatic subdural hemorrhage with loss of consciousness status unknown, initial encounter: Secondary | ICD-10-CM | POA: Diagnosis present
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
## 2024-09-30 NOTE — ED Triage Notes (Signed)
 Pt coming from home via ems for evaluation of left arm weakness since yesterday morning. Pt had a thrombectomy 2 weeks ago and has an initial NIHSS of 0. Pt alert and oriented upon arrival and in no apparent distress 20g rac by ems

## 2024-10-01 ENCOUNTER — Emergency Department (HOSPITAL_COMMUNITY)

## 2024-10-01 ENCOUNTER — Inpatient Hospital Stay (HOSPITAL_COMMUNITY)

## 2024-10-01 DIAGNOSIS — E876 Hypokalemia: Secondary | ICD-10-CM | POA: Diagnosis present

## 2024-10-01 DIAGNOSIS — E781 Pure hyperglyceridemia: Secondary | ICD-10-CM

## 2024-10-01 DIAGNOSIS — R569 Unspecified convulsions: Secondary | ICD-10-CM

## 2024-10-01 DIAGNOSIS — E785 Hyperlipidemia, unspecified: Secondary | ICD-10-CM | POA: Diagnosis not present

## 2024-10-01 DIAGNOSIS — K219 Gastro-esophageal reflux disease without esophagitis: Secondary | ICD-10-CM

## 2024-10-01 DIAGNOSIS — Z941 Heart transplant status: Secondary | ICD-10-CM

## 2024-10-01 DIAGNOSIS — E119 Type 2 diabetes mellitus without complications: Secondary | ICD-10-CM | POA: Diagnosis not present

## 2024-10-01 DIAGNOSIS — E1165 Type 2 diabetes mellitus with hyperglycemia: Secondary | ICD-10-CM | POA: Diagnosis not present

## 2024-10-01 DIAGNOSIS — F418 Other specified anxiety disorders: Secondary | ICD-10-CM

## 2024-10-01 DIAGNOSIS — I152 Hypertension secondary to endocrine disorders: Secondary | ICD-10-CM

## 2024-10-01 DIAGNOSIS — E1159 Type 2 diabetes mellitus with other circulatory complications: Secondary | ICD-10-CM

## 2024-10-01 DIAGNOSIS — Z794 Long term (current) use of insulin: Secondary | ICD-10-CM

## 2024-10-01 LAB — COMPREHENSIVE METABOLIC PANEL WITH GFR
ALT: 11 U/L (ref 0–44)
AST: 18 U/L (ref 15–41)
Albumin: 3.6 g/dL (ref 3.5–5.0)
Alkaline Phosphatase: 53 U/L (ref 38–126)
Anion gap: 11 (ref 5–15)
BUN: 9 mg/dL (ref 8–23)
CO2: 23 mmol/L (ref 22–32)
Calcium: 8.7 mg/dL — ABNORMAL LOW (ref 8.9–10.3)
Chloride: 99 mmol/L (ref 98–111)
Creatinine, Ser: 1.1 mg/dL (ref 0.61–1.24)
GFR, Estimated: >60 mL/min (ref >60)
Glucose, Bld: 258 mg/dL — ABNORMAL HIGH (ref 70–99)
Potassium: 3.1 mmol/L — ABNORMAL LOW (ref 3.5–5.1)
Sodium: 133 mmol/L — ABNORMAL LOW (ref 135–145)
Total Bilirubin: 0.6 mg/dL (ref 0.0–1.2)
Total Protein: 6.7 g/dL (ref 6.5–8.1)

## 2024-10-01 LAB — CBC
HCT: 35.8 % — ABNORMAL LOW (ref 39.0–52.0)
Hemoglobin: 10.8 g/dL — ABNORMAL LOW (ref 13.0–17.0)
MCH: 22.7 pg — ABNORMAL LOW (ref 26.0–34.0)
MCHC: 30.2 g/dL (ref 30.0–36.0)
MCV: 75.2 fL — ABNORMAL LOW (ref 80.0–100.0)
Platelets: 136 K/uL — ABNORMAL LOW (ref 150–400)
RBC: 4.76 MIL/uL (ref 4.22–5.81)
RDW: 16.5 % — ABNORMAL HIGH (ref 11.5–15.5)
WBC: 8.1 K/uL (ref 4.0–10.5)
nRBC: 0 % (ref 0.0–0.2)

## 2024-10-01 LAB — DIFFERENTIAL
Abs Immature Granulocytes: 0.02 K/uL (ref 0.00–0.07)
Basophils Absolute: 0 K/uL (ref 0.0–0.1)
Basophils Relative: 1 %
Eosinophils Absolute: 0.1 K/uL (ref 0.0–0.5)
Eosinophils Relative: 1 %
Immature Granulocytes: 0 %
Lymphocytes Relative: 32 %
Lymphs Abs: 2.6 K/uL (ref 0.7–4.0)
Monocytes Absolute: 0.9 K/uL (ref 0.1–1.0)
Monocytes Relative: 11 %
Neutro Abs: 4.5 K/uL (ref 1.7–7.7)
Neutrophils Relative %: 55 %

## 2024-10-01 LAB — PROTIME-INR
INR: 1.1 (ref 0.8–1.2)
Prothrombin Time: 14.3 s (ref 11.4–15.2)

## 2024-10-01 LAB — GLUCOSE, CAPILLARY
Glucose-Capillary: 267 mg/dL — ABNORMAL HIGH (ref 70–99)
Glucose-Capillary: 256 mg/dL — ABNORMAL HIGH (ref 70–99)

## 2024-10-01 LAB — CBG MONITORING, ED
Glucose-Capillary: 220 mg/dL — ABNORMAL HIGH (ref 70–99)
Glucose-Capillary: 263 mg/dL — ABNORMAL HIGH (ref 70–99)
Glucose-Capillary: 222 mg/dL — ABNORMAL HIGH (ref 70–99)
Glucose-Capillary: 262 mg/dL — ABNORMAL HIGH (ref 70–99)

## 2024-10-01 LAB — ETHANOL: Alcohol, Ethyl (B): <15 mg/dL (ref <15)

## 2024-10-01 LAB — HIV ANTIBODY (ROUTINE TESTING W REFLEX): HIV Screen 4th Generation wRfx: NONREACTIVE

## 2024-10-01 LAB — APTT: aPTT: 27 s (ref 24–36)

## 2024-10-01 MED ORDER — ACETAMINOPHEN 325 MG PO TABS
650.0000 mg | ORAL_TABLET | Freq: Four times a day (QID) | ORAL | Status: DC | PRN
  Administered 2024-10-03: 650 mg via ORAL
  Filled 2024-10-01: qty 2

## 2024-10-01 MED ORDER — ACETAMINOPHEN 650 MG RE SUPP: 650.0000 mg | Freq: Four times a day (QID) | RECTAL | Status: DC | PRN

## 2024-10-01 MED ORDER — LEVETIRACETAM 250 MG PO TABS
500.0000 mg | ORAL_TABLET | Freq: Two times a day (BID) | ORAL | Status: DC
  Administered 2024-10-01 – 2024-10-02 (×2): 500 mg via ORAL
  Filled 2024-10-01 (×2): qty 2

## 2024-10-01 MED ORDER — MYCOPHENOLATE MOFETIL 250 MG PO CAPS
250.0000 mg | ORAL_CAPSULE | Freq: Two times a day (BID) | ORAL | Status: DC
  Administered 2024-10-01 – 2024-10-08 (×13): 250 mg via ORAL
  Filled 2024-10-01 (×14): qty 1

## 2024-10-01 MED ORDER — LEVETIRACETAM (KEPPRA) 500 MG/5 ML ADULT IV PUSH
1500.0000 mg | Freq: Once | INTRAVENOUS | Status: AC
  Administered 2024-10-01: 1500 mg via INTRAVENOUS
  Filled 2024-10-01: qty 15

## 2024-10-01 MED ORDER — ALPRAZOLAM 0.5 MG PO TABS
1.0000 mg | ORAL_TABLET | Freq: Three times a day (TID) | ORAL | Status: DC | PRN
  Administered 2024-10-01 – 2024-10-06 (×5): 1 mg via ORAL
  Filled 2024-10-01 (×6): qty 2

## 2024-10-01 MED ORDER — INSULIN ASPART 100 UNIT/ML IJ SOLN
0.0000 [IU] | Freq: Three times a day (TID) | INTRAMUSCULAR | Status: DC
  Administered 2024-10-01: 5 [IU] via SUBCUTANEOUS
  Administered 2024-10-01 (×2): 8 [IU] via SUBCUTANEOUS
  Administered 2024-10-02: 11 [IU] via SUBCUTANEOUS
  Administered 2024-10-02: 5 [IU] via SUBCUTANEOUS
  Administered 2024-10-03 (×2): 2 [IU] via SUBCUTANEOUS
  Administered 2024-10-03: 3 [IU] via SUBCUTANEOUS
  Administered 2024-10-04: 2 [IU] via SUBCUTANEOUS
  Administered 2024-10-04: 3 [IU] via SUBCUTANEOUS
  Administered 2024-10-05: 2 [IU] via SUBCUTANEOUS
  Administered 2024-10-06: 11 [IU] via SUBCUTANEOUS
  Administered 2024-10-06 (×2): 3 [IU] via SUBCUTANEOUS
  Administered 2024-10-07: 5 [IU] via SUBCUTANEOUS
  Administered 2024-10-07: 3 [IU] via SUBCUTANEOUS
  Administered 2024-10-07: 5 [IU] via SUBCUTANEOUS
  Administered 2024-10-08 (×2): 3 [IU] via SUBCUTANEOUS
  Filled 2024-10-01: qty 3
  Filled 2024-10-01: qty 2
  Filled 2024-10-01: qty 3
  Filled 2024-10-01: qty 2
  Filled 2024-10-01: qty 11
  Filled 2024-10-01: qty 3
  Filled 2024-10-01: qty 5
  Filled 2024-10-01: qty 2
  Filled 2024-10-01 (×2): qty 3
  Filled 2024-10-01: qty 2
  Filled 2024-10-01: qty 8
  Filled 2024-10-01 (×2): qty 5
  Filled 2024-10-01: qty 3
  Filled 2024-10-01: qty 11
  Filled 2024-10-01: qty 8

## 2024-10-01 MED ORDER — AMLODIPINE BESYLATE 5 MG PO TABS
2.5000 mg | ORAL_TABLET | Freq: Every day | ORAL | Status: AC
  Administered 2024-10-01 – 2024-10-08 (×6): 2.5 mg via ORAL
  Filled 2024-10-01 (×6): qty 1

## 2024-10-01 MED ORDER — CALCIUM GLUCONATE-NACL 1-0.675 GM/50ML-% IV SOLN
1.0000 g | Freq: Once | INTRAVENOUS | Status: AC
  Administered 2024-10-01: 1000 mg via INTRAVENOUS
  Filled 2024-10-01: qty 50

## 2024-10-01 MED ORDER — POTASSIUM CHLORIDE CRYS ER 20 MEQ PO TBCR
40.0000 meq | EXTENDED_RELEASE_TABLET | Freq: Once | ORAL | Status: AC
  Administered 2024-10-01: 40 meq via ORAL
  Filled 2024-10-01: qty 2

## 2024-10-01 MED ORDER — SODIUM CHLORIDE 0.9% FLUSH
3.0000 mL | Freq: Two times a day (BID) | INTRAVENOUS | Status: DC
  Administered 2024-10-01 – 2024-10-05 (×9): 3 mL via INTRAVENOUS

## 2024-10-01 MED ORDER — INSULIN GLARGINE 100 UNIT/ML ~~LOC~~ SOLN
9.0000 [IU] | Freq: Every day | SUBCUTANEOUS | Status: DC
  Administered 2024-10-01 – 2024-10-02 (×2): 9 [IU] via SUBCUTANEOUS
  Filled 2024-10-01 (×2): qty 0.09

## 2024-10-01 MED ORDER — ONDANSETRON HCL 4 MG/2ML IJ SOLN: 4.0000 mg | Freq: Four times a day (QID) | INTRAMUSCULAR | Status: DC | PRN

## 2024-10-01 MED ORDER — SENNA 8.6 MG PO TABS
1.0000 | ORAL_TABLET | Freq: Every day | ORAL | Status: DC | PRN
  Administered 2024-10-01: 17.2 mg via ORAL
  Filled 2024-10-01: qty 2

## 2024-10-01 MED ORDER — INSULIN ASPART 100 UNIT/ML IJ SOLN
0.0000 [IU] | Freq: Every day | INTRAMUSCULAR | Status: DC
  Administered 2024-10-01: 3 [IU] via SUBCUTANEOUS
  Administered 2024-10-03 – 2024-10-05 (×2): 2 [IU] via SUBCUTANEOUS
  Administered 2024-10-06: 3 [IU] via SUBCUTANEOUS
  Filled 2024-10-01: qty 3
  Filled 2024-10-01 (×3): qty 2

## 2024-10-01 MED ORDER — TACROLIMUS 0.5 MG PO CAPS
0.5000 mg | ORAL_CAPSULE | Freq: Two times a day (BID) | ORAL | Status: DC
  Administered 2024-10-01 – 2024-10-08 (×13): 0.5 mg via ORAL
  Filled 2024-10-01 (×15): qty 1

## 2024-10-01 MED ORDER — DAPAGLIFLOZIN PROPANEDIOL 10 MG PO TABS
10.0000 mg | ORAL_TABLET | Freq: Every day | ORAL | Status: DC
  Administered 2024-10-02 – 2024-10-08 (×8): 10 mg via ORAL
  Filled 2024-10-01 (×10): qty 1

## 2024-10-01 MED ORDER — ALBUTEROL SULFATE (2.5 MG/3ML) 0.083% IN NEBU: 2.5000 mg | INHALATION_SOLUTION | Freq: Four times a day (QID) | RESPIRATORY_TRACT | Status: DC | PRN

## 2024-10-01 MED ORDER — BUPROPION HCL ER (XL) 150 MG PO TB24
300.0000 mg | ORAL_TABLET | Freq: Every day | ORAL | Status: DC
  Administered 2024-10-02: 300 mg via ORAL
  Filled 2024-10-01: qty 2

## 2024-10-01 MED ORDER — ATORVASTATIN CALCIUM 40 MG PO TABS
40.0000 mg | ORAL_TABLET | Freq: Every day | ORAL | Status: DC
  Administered 2024-10-01 – 2024-10-04 (×4): 40 mg via ORAL
  Filled 2024-10-01 (×4): qty 1

## 2024-10-01 MED ORDER — VILAZODONE HCL 20 MG PO TABS
20.0000 mg | ORAL_TABLET | Freq: Every day | ORAL | Status: DC
  Administered 2024-10-02 – 2024-10-08 (×6): 20 mg via ORAL
  Filled 2024-10-01 (×7): qty 1

## 2024-10-01 MED ORDER — CARVEDILOL 12.5 MG PO TABS
12.5000 mg | ORAL_TABLET | Freq: Two times a day (BID) | ORAL | Status: AC
  Administered 2024-10-01 – 2024-10-08 (×13): 12.5 mg via ORAL
  Filled 2024-10-01 (×14): qty 1

## 2024-10-01 MED ORDER — ONDANSETRON HCL 4 MG PO TABS
4.0000 mg | ORAL_TABLET | Freq: Four times a day (QID) | ORAL | Status: DC | PRN
  Administered 2024-10-02: 4 mg via ORAL
  Filled 2024-10-01: qty 1

## 2024-10-01 MED ORDER — PANTOPRAZOLE SODIUM 40 MG PO TBEC
40.0000 mg | DELAYED_RELEASE_TABLET | Freq: Two times a day (BID) | ORAL | Status: DC
  Administered 2024-10-01 – 2024-10-08 (×13): 40 mg via ORAL
  Filled 2024-10-01 (×13): qty 1

## 2024-10-01 MED ORDER — HYDRALAZINE HCL 20 MG/ML IJ SOLN
10.0000 mg | INTRAMUSCULAR | Status: DC | PRN
  Administered 2024-10-08: 10 mg via INTRAVENOUS
  Filled 2024-10-01: qty 1

## 2024-10-01 NOTE — ED Notes (Signed)
 Neurology at bedside.
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
## 2024-10-01 NOTE — Progress Notes (Signed)
 EEG complete - results pending

## 2024-10-01 NOTE — ED Provider Notes (Signed)
  Physical Exam  BP 126/82   Pulse 94   Temp 98.5 F (36.9 C) (Oral)   Resp 16   Ht 5' 7 (1.702 m)   Wt 54.9 kg   SpO2 99%   BMI 18.95 kg/m   Physical Exam  Procedures  Procedures  ED Course / MDM   Clinical Course as of 10/01/24 0930  Sat Oct 01, 2024  0757 Brief period of seizure activity.  Keppra  load given.  Patient remains at baseline.  He has been evaluated by Dr. Darnella (neurosurgery) who will plan for IR embolization of MMA likely on Monday.  Will plan for medical admission to the hospitalist service.  Neurology team will be able to help manage seizures during admission [MP]    Clinical Course User Index [MP] Pamella Ozell LABOR, DO   Medical Decision Making I, Ozell Pamella DO, have assumed care of this patient from the previous provider pending neurosurgery evaluation and disposition  Amount and/or Complexity of Data Reviewed Labs: ordered. Radiology: ordered.  Risk Decision regarding hospitalization.          Pamella Ozell LABOR, DO 10/01/24 0930

## 2024-10-01 NOTE — ED Notes (Signed)
 Pt back to baseline, able to answer questions. A&O x4. Vitals continue to be stable. Seizure precautions initiated.

## 2024-10-01 NOTE — ED Notes (Signed)
 Nurse was at bedside talking to pt and assessing him when he stopped talking, grabbed his left shoulder with his right hand and started having jerking movements with his head turned to the left. Activity lasted approx 30 secounds. Vitals remained stable. Provider called to bedside.

## 2024-10-02 DIAGNOSIS — S065XAA Traumatic subdural hemorrhage with loss of consciousness status unknown, initial encounter: Secondary | ICD-10-CM

## 2024-10-02 DIAGNOSIS — E1165 Type 2 diabetes mellitus with hyperglycemia: Secondary | ICD-10-CM

## 2024-10-02 DIAGNOSIS — R569 Unspecified convulsions: Secondary | ICD-10-CM

## 2024-10-02 DIAGNOSIS — Z794 Long term (current) use of insulin: Secondary | ICD-10-CM

## 2024-10-02 DIAGNOSIS — Z9889 Other specified postprocedural states: Secondary | ICD-10-CM

## 2024-10-02 LAB — GLUCOSE, CAPILLARY
Glucose-Capillary: 318 mg/dL — ABNORMAL HIGH (ref 70–99)
Glucose-Capillary: 249 mg/dL — ABNORMAL HIGH (ref 70–99)
Glucose-Capillary: 111 mg/dL — ABNORMAL HIGH (ref 70–99)
Glucose-Capillary: 147 mg/dL — ABNORMAL HIGH (ref 70–99)

## 2024-10-02 LAB — CBC
HCT: 36.3 % — ABNORMAL LOW (ref 39.0–52.0)
Hemoglobin: 10.7 g/dL — ABNORMAL LOW (ref 13.0–17.0)
MCH: 22.2 pg — ABNORMAL LOW (ref 26.0–34.0)
MCHC: 29.5 g/dL — ABNORMAL LOW (ref 30.0–36.0)
MCV: 75.2 fL — ABNORMAL LOW (ref 80.0–100.0)
Platelets: 128 K/uL — ABNORMAL LOW (ref 150–400)
RBC: 4.83 MIL/uL (ref 4.22–5.81)
RDW: 16.5 % — ABNORMAL HIGH (ref 11.5–15.5)
WBC: 6.7 K/uL (ref 4.0–10.5)
nRBC: 0 % (ref 0.0–0.2)

## 2024-10-02 LAB — BASIC METABOLIC PANEL WITH GFR
Anion gap: 12 (ref 5–15)
BUN: 15 mg/dL (ref 8–23)
CO2: 19 mmol/L — ABNORMAL LOW (ref 22–32)
Calcium: 9 mg/dL (ref 8.9–10.3)
Chloride: 104 mmol/L (ref 98–111)
Creatinine, Ser: 1.32 mg/dL — ABNORMAL HIGH (ref 0.61–1.24)
GFR, Estimated: 59 mL/min — ABNORMAL LOW (ref >60)
Glucose, Bld: 287 mg/dL — ABNORMAL HIGH (ref 70–99)
Potassium: 4.5 mmol/L (ref 3.5–5.1)
Sodium: 135 mmol/L (ref 135–145)

## 2024-10-02 MED ORDER — INSULIN ASPART 100 UNIT/ML IJ SOLN
4.0000 [IU] | Freq: Three times a day (TID) | INTRAMUSCULAR | Status: DC
  Administered 2024-10-02 – 2024-10-05 (×9): 4 [IU] via SUBCUTANEOUS
  Filled 2024-10-02 (×9): qty 4

## 2024-10-02 MED ORDER — LEVETIRACETAM 500 MG PO TABS
1500.0000 mg | ORAL_TABLET | Freq: Two times a day (BID) | ORAL | Status: DC
  Administered 2024-10-03 – 2024-10-08 (×10): 1500 mg via ORAL
  Filled 2024-10-02 (×5): qty 3
  Filled 2024-10-02: qty 6
  Filled 2024-10-02 (×2): qty 3
  Filled 2024-10-02: qty 6
  Filled 2024-10-02: qty 3

## 2024-10-02 MED ORDER — INSULIN GLARGINE 100 UNIT/ML ~~LOC~~ SOLN
14.0000 [IU] | Freq: Every day | SUBCUTANEOUS | Status: DC
  Administered 2024-10-03 – 2024-10-04 (×2): 14 [IU] via SUBCUTANEOUS
  Filled 2024-10-02 (×3): qty 0.14

## 2024-10-02 MED ORDER — LEVETIRACETAM (KEPPRA) 500 MG/5 ML ADULT IV PUSH
2000.0000 mg | Freq: Once | INTRAVENOUS | Status: AC
  Administered 2024-10-02: 2000 mg via INTRAVENOUS
  Filled 2024-10-02: qty 20

## 2024-10-02 MED ORDER — LEVETIRACETAM (KEPPRA) 500 MG/5 ML ADULT IV PUSH
1000.0000 mg | Freq: Once | INTRAVENOUS | Status: AC
  Administered 2024-10-02: 1000 mg via INTRAVENOUS
  Filled 2024-10-02: qty 10

## 2024-10-02 MED ORDER — LEVETIRACETAM 250 MG PO TABS
750.0000 mg | ORAL_TABLET | Freq: Two times a day (BID) | ORAL | Status: DC
  Administered 2024-10-02: 750 mg via ORAL
  Filled 2024-10-02: qty 3

## 2024-10-02 NOTE — Progress Notes (Addendum)
 PROGRESS NOTE        PATIENT DETAILS Name: Anthony Price Age: 68 y.o. Sex: male Date of Birth: September 25, 1956 Admit Date: 09/30/2024 Admitting Physician Maximino DELENA Sharps, MD ERE:Izuupwhzm, Fonda DELENA, MD  Brief Summary: Patient is a 68 y.o.  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  Significant events: 12/5>> admit to [facility]  Significant studies: 12/5>> CT head: Mild interval increase in right subdural hematoma 12/6>> MRI brain: Mild interval increase in size of subdural hematoma-18 mm.  Subarachnoid hemorrhage within the right cerebral cortex.  Significant microbiology data: 12/6>> EEG: No seizures  Procedures: None  Consults: Neurosurgery Telephone consult-neurology-Dr. Vanessa  Subjective: Awake-LUE weakness seems to have resolved speech is slightly slurred (per patient this is his new baseline since his most recent craniotomy).  After I saw him-he had a very brief episode of seizure witnessed by the RN.  See below.  Objective: Vitals: Blood pressure 107/70, pulse 100, temperature 99.5 F (37.5 C), temperature source Oral, resp. rate (!) 22, height 5' 7 (1.702 m), weight 54.9 kg, SpO2 99%.   Exam: Gen Exam:Alert awake-not in any distress HEENT:atraumatic, normocephalic Chest: B/L clear to auscultation anteriorly CVS:S1S2 regular Abdomen:soft non tender, non distended Extremities:no edema Neurology: Non focal-do not see any LUE weakness that he had on admission. Skin: no rash  Pertinent Labs/Radiology:    Latest Ref Rng & Units 10/02/2024    7:40 AM 09/30/2024   10:33 PM 09/12/2024    9:37 AM  CBC  WBC  4.0 - 10.5 K/uL 6.7  8.1  13.3   Hemoglobin 13.0 - 17.0 g/dL 89.2  89.1  89.0   Hematocrit 39.0 - 52.0 % 36.3  35.8  35.3   Platelets 150 - 400 K/uL 128  136  206     Lab Results  Component Value Date   NA 133 (L) 09/30/2024   K 3.1 (L) 09/30/2024   CL 99 09/30/2024   CO2 23 09/30/2024      Assessment/Plan: SDH Worsening radiologically-likely provoking seizures Neurosurgery following-inpatient MMA embolization being planned.  Seizure disorder Provoked-likely due to SDH Suspect LUE weakness on presentation could have been Todd's paresis-as it has completely resolved on my exam today. Appears to have another breakthrough seizure this morning-neurologist-Dr. Vanessa righter advises to be given another 1000 mg of IV Keppra  now, and increase maintenance dose to 750 mg twice daily.  Wellbutrin  discontinued as it can lower seizure threshold.  DM-2 with uncontrolled hyperglycemia (A1c 7.9 on 11/16)  CBGs remain uncontrolled Increase Lantus  to 14 units Add 4 units of NovoLog  with meals Continue SSI Follow/optimize  Recent Labs    10/01/24 1557 10/01/24 2125 10/02/24 0800  GLUCAP 267* 256* 318*     Essential hypertension BP stable-continue Coreg /amlodipine     History of heart transplant 2000 Chronic immunosuppressive therapy Continue CellCept  and Prograf  Per most recent echocardiogram at [facility]-normal LV function.   Hypokalemia Repleted-awaiting a.m. labs.   Depression with anxiety Continue Viibryd  and Xanax  Stop Wellbutrin -lowers seizure threshold-patient having more breakthrough seizures this morning.     Hypertriglyceridemia Atorvastatin  and fenofibrate    GERD Protonix   Normocytic anemia Chronic issue-likely due to underlying  chronic disease/heart transplant/immunosuppressive's Follow    Code status:   Code Status: Full Code   DVT Prophylaxis: SCDs Start: 10/01/24 0827   Family Communication: None at bedside   Disposition Plan: Status is:  Inpatient Remains inpatient appropriate because: Severity of illness   Planned Discharge Destination:Home   Diet: Diet Order             Diet Carb Modified Fluid consistency: Thin; Room service appropriate? Yes  Diet effective now                     Antimicrobial agents: Anti-infectives (From admission, onward)    None        MEDICATIONS: Scheduled Meds:  amLODipine   2.5 mg Oral Daily   atorvastatin   40 mg Oral Daily   carvedilol   12.5 mg Oral BID WC   dapagliflozin  propanediol  10 mg Oral QAC breakfast   insulin  aspart  0-15 Units Subcutaneous TID WC   insulin  aspart  0-5 Units Subcutaneous QHS   insulin  glargine  9 Units Subcutaneous Daily   levETIRAcetam   750 mg Oral BID   levETIRAcetam   1,000 mg Intravenous Once   mycophenolate   250 mg Oral BID   pantoprazole   40 mg Oral BID   sodium chloride  flush  3 mL Intravenous Q12H   tacrolimus   0.5 mg Oral BID   Vilazodone  HCl  20 mg Oral Daily   Continuous Infusions: PRN Meds:.acetaminophen  **OR** acetaminophen , albuterol , ALPRAZolam , hydrALAZINE , ondansetron  **OR** ondansetron  (ZOFRAN ) IV, senna   I have personally reviewed following labs and imaging studies  LABORATORY DATA: CBC: Recent Labs  Lab 09/30/24 2233 10/02/24 0740  WBC 8.1 6.7  NEUTROABS 4.5  --   HGB 10.8* 10.7*  HCT 35.8* 36.3*  MCV 75.2* 75.2*  PLT 136* 128*    Basic Metabolic Panel: Recent Labs  Lab 09/30/24 2233  NA 133*  K 3.1*  CL 99  CO2 23  GLUCOSE 258*  BUN 9  CREATININE 1.10  CALCIUM  8.7*    GFR: Estimated Creatinine Clearance: 51.3 mL/min (by C-G formula based on SCr of 1.1 mg/dL).  Liver Function Tests: Recent Labs  Lab 09/30/24 2233  AST 18  ALT 11  ALKPHOS 53  BILITOT 0.6  PROT 6.7  ALBUMIN 3.6   No results for input(s): LIPASE, AMYLASE in the last 168 hours. No results for input(s): AMMONIA in the last 168 hours.  Coagulation Profile: Recent Labs  Lab 09/30/24 2233  INR 1.1    Cardiac  Enzymes: No results for input(s): CKTOTAL, CKMB, CKMBINDEX, TROPONINI in the last 168 hours.  BNP (last 3 results) No results for input(s): PROBNP in the last 8760 hours.  Lipid Profile: No results for input(s): CHOL, HDL, LDLCALC, TRIG, CHOLHDL, LDLDIRECT in the last 72 hours.  Thyroid  Function Tests: No results for input(s): TSH, T4TOTAL, FREET4, T3FREE, THYROIDAB in the last 72 hours.  Anemia Panel: No results for input(s): VITAMINB12, FOLATE, FERRITIN, TIBC, IRON, RETICCTPCT in the last 72 hours.  Urine analysis:    Component Value Date/Time   COLORURINE STRAW (A) 01/30/2024 2245   APPEARANCEUR CLEAR 01/30/2024 2245   LABSPEC 1.020 01/30/2024 2245   PHURINE 6.0 01/30/2024 2245   GLUCOSEU >=500 (A) 01/30/2024 2245   HGBUR NEGATIVE 01/30/2024 2245   BILIRUBINUR NEGATIVE 01/30/2024 2245   KETONESUR NEGATIVE 01/30/2024 2245   PROTEINUR 30 (A) 01/30/2024 2245   NITRITE NEGATIVE 01/30/2024 2245   LEUKOCYTESUR NEGATIVE 01/30/2024 2245    Sepsis Labs: Lactic Acid, Venous No  results found for: LATICACIDVEN  MICROBIOLOGY: No results found for this or any previous visit (from the past 240 hours).  RADIOLOGY STUDIES/RESULTS: EEG adult Result Date: 10/01/2024 Matthews Elida HERO, MD     10/02/2024  7:10 AM Routine EEG Report Anthony Price is a 68 y.o. male with a history of altered mental status who is undergoing an EEG to evaluate for seizures. Report: This EEG was acquired with electrodes placed according to the International 10-20 electrode system (including Fp1, Fp2, F3, F4, C3, C4, P3, P4, O1, O2, T3, T4, T5, T6, A1, A2, Fz, Cz, Pz). The following electrodes were missing or displaced: none. The occipital dominant rhythm was 9 Hz. This activity is reactive to stimulation. Drowsiness was manifested by background fragmentation; deeper stages of sleep were identified by K complexes and sleep spindles. There was no focal slowing. There were  no interictal epileptiform discharges. There were no electrographic seizures identified. Impression: This EEG was obtained while awake and asleep and is normal.   Clinical Correlation: Normal EEGs, however, do not rule out epilepsy. Elida Matthews, MD [facility] [phone] If 7pm- 7am, please page neurology on call as listed in AMION.   MR BRAIN WO CONTRAST Result Date: 10/01/2024 EXAM: MRI BRAIN WITHOUT CONTRAST 10/01/2024 05:45:03 AM TECHNIQUE: Multiplanar multisequence MRI of the head/brain was performed without the administration of intravenous contrast. COMPARISON: CT of the head dated 10/01/2024. CLINICAL HISTORY: Neuro deficit, acute, stroke suspected. FINDINGS: BRAIN AND VENTRICLES: No acute infarct. There is a heterogeneous extraaxial fluid collection again seen overlying the right cerebral hemisphere, which appears to represent a subdural hematoma. It appears to have mildly increased in size in the interim on both the axial and coronal images, with maximal thickness on the coronal imaging increasing from 15 mm to 18 mm. On the axial images, the fluid collection overlying the right frontal lobe appears to have increased in thickness from 18 mm to 22 mm. There is subarachnoid hemorrhage also present within the right cerebral vertex. No mass. There is approximately 4 mm of shift of the midline structures to the left, similar to prior. No hydrocephalus. There are numerous foci of increased T2 signal scattered throughout the cerebral white matter. The sella is unremarkable. Normal flow voids. ORBITS: No acute abnormality. SINUSES AND MASTOIDS: No acute abnormality. BONES AND SOFT TISSUES: Normal marrow signal. No acute soft tissue abnormality. IMPRESSION: 1. Heterogeneous extraaxial fluid collection overlying the right cerebral hemisphere, consistent with a subdural hematoma, with mild interval increase in size. Maximal thickness measures 18 mm on coronal images and 22 mm on axial images.  Associated approximately 4 mm leftward midline shift, similar to prior. 2. Subarachnoid hemorrhage within the right cerebral vertex. 3. Numerous foci of increased T2 signal scattered throughout the cerebral white matter. Electronically signed by: Evalene Coho MD 10/01/2024 06:17 AM EST RP Workstation: [id number]   CT HEAD WO CONTRAST Result Date: 10/01/2024 EXAM: CT HEAD WITHOUT CONTRAST 10/01/2024 12:11:09 AM TECHNIQUE: CT of the head was performed without the administration of intravenous contrast. Automated exposure control, iterative reconstruction, and/or weight based adjustment of the mA/kV was utilized to reduce the radiation dose to as low as reasonably achievable. COMPARISON: CT head 09/12/2024 CLINICAL HISTORY: Neuro deficit, acute, stroke suspected FINDINGS: BRAIN AND VENTRICLES: Mild interval decrease in size of a mixed-density right subdural hematoma, which now measures up to 1.8 cm in thickness (previously 1.9 cm when remeasured). Slight improvement in midline shift, now 4 mm (previously 5 mm) No evidence of acute infarct. No hydrocephalus.  ORBITS: No acute abnormality. SINUSES: No acute abnormality. SOFT TISSUES AND SKULL: Right craniotomy.  Drain had been removed. No skull fracture. IMPRESSION: 1. Mild interval decrease in size of a mixed-density right subdural hematoma, which now measures up to 1.8 cm in thickness (previously 1.9 cm when remeasured). 2. Slight improvement in midline shift, now 4 mm (previously 5 mm). Electronically signed by: Gilmore Molt MD 10/01/2024 12:28 AM EST RP Workstation: [id number]     LOS: 1 day   Donalda Applebaum, MD  [facility]    To contact the attending provider between 7A-7P or the covering provider during after hours 7P-7A, please log into the web site [email or web address] and access using universal  password for that web site. If you do not have the password, please call the hospital operator.  10/02/2024, 9:33 AM

## 2024-10-02 NOTE — Plan of Care (Signed)
   Problem: Coping: Goal: Ability to adjust to condition or change in health will improve Outcome: Progressing   Problem: Fluid Volume: Goal: Ability to maintain a balanced intake and output will improve Outcome: Progressing   Problem: Skin Integrity: Goal: Risk for impaired skin integrity will decrease Outcome: Progressing

## 2024-10-02 NOTE — Significant Event (Signed)
 Patient had a generalized tonic-clonic activity following which patient's nurse noticed twitching like activities on his face.  I reviewed patient's labs notes and medications.  I discussed with on-call neurologist Dr. Lindzen who at this time given the admission for seizure and presently on Keppra  requested loading patient on 2 g like Keppra  and neurology will be seeing patient to give further recommendations.  Redia Cleaver MD.

## 2024-10-02 NOTE — Procedures (Signed)
 Routine EEG Report  Anthony Price is a 68 y.o. male with a history of altered mental status who is undergoing an EEG to evaluate for seizures.  Report: This EEG was acquired with electrodes placed according to the International 10-20 electrode system (including Fp1, Fp2, F3, F4, C3, C4, P3, P4, O1, O2, T3, T4, T5, T6, A1, A2, Fz, Cz, Pz). The following electrodes were missing or displaced: none.  The occipital dominant rhythm was 9 Hz. This activity is reactive to stimulation. Drowsiness was manifested by background fragmentation; deeper stages of sleep were identified by K complexes and sleep spindles. There was no focal slowing. There were no interictal epileptiform discharges. There were no electrographic seizures identified.   Impression: This EEG was obtained while awake and asleep and is normal.    Clinical Correlation: Normal EEGs, however, do not rule out epilepsy.  Elida Ross, MD [facility] [phone]  If 7pm- 7am, please page neurology on call as listed in AMION.

## 2024-10-02 NOTE — Progress Notes (Signed)
 Assessment 68 y/o M w/ hx heart transplant, uncontrolled DM2, 3 weeks s/p R craniotomy for evacuation of SDH who presents with LUE neglect/weakness, witnessed seizure activity, and imaging showing increased posterior R SDH mass effect compared to postop    LOS: 1 day    Plan: Anticipate inpatient MMA embolization Defer seizure mgmt to neurology service SBP<140   Subjective: Neurologic unchanged.  No seizure activity.  EEG without abnormality  Objective: Vital signs in last 24 hours: Temp:  [98.5 F (36.9 C)-99.8 F (37.7 C)] 99.5 F (37.5 C) (12/07 0749) Pulse Rate:  [90-103] 100 (12/07 0749) Resp:  [14-22] 22 (12/07 0749) BP: (107-133)/(70-93) 107/70 (12/07 0749) SpO2:  [99 %-100 %] 99 % (12/07 0749)  Intake/Output from previous day: No intake/output data recorded. Intake/Output this shift: No intake/output data recorded.  Exam: GCS 15 Flat affect Conjugate gaze FS RUE and BLE: Full strength and sensation LUE: Neglect. Will move antigravity with stimulation but not to command  Lab Results: Recent Labs    09/30/24 2233 10/02/24 0740  WBC 8.1 6.7  HGB 10.8* 10.7*  HCT 35.8* 36.3*  PLT 136* 128*   BMET Recent Labs    09/30/24 2233  NA 133*  K 3.1*  CL 99  CO2 23  GLUCOSE 258*  BUN 9  CREATININE 1.10  CALCIUM  8.7*    Studies/Results: EEG adult Result Date: 10/01/2024 Matthews Elida HERO, MD     10/02/2024  7:10 AM Routine EEG Report ZUBAYR BEDNARCZYK is a 68 y.o. male with a history of altered mental status who is undergoing an EEG to evaluate for seizures. Report: This EEG was acquired with electrodes placed according to the International 10-20 electrode system (including Fp1, Fp2, F3, F4, C3, C4, P3, P4, O1, O2, T3, T4, T5, T6, A1, A2, Fz, Cz, Pz). The following electrodes were missing or displaced: none. The occipital dominant rhythm was 9 Hz. This activity is reactive to stimulation. Drowsiness was manifested by background fragmentation; deeper stages of  sleep were identified by K complexes and sleep spindles. There was no focal slowing. There were no interictal epileptiform discharges. There were no electrographic seizures identified. Impression: This EEG was obtained while awake and asleep and is normal.   Clinical Correlation: Normal EEGs, however, do not rule out epilepsy. Elida Matthews, MD [facility] [phone] If 7pm- 7am, please page neurology on call as listed in AMION.   MR BRAIN WO CONTRAST Result Date: 10/01/2024 EXAM: MRI BRAIN WITHOUT CONTRAST 10/01/2024 05:45:03 AM TECHNIQUE: Multiplanar multisequence MRI of the head/brain was performed without the administration of intravenous contrast. COMPARISON: CT of the head dated 10/01/2024. CLINICAL HISTORY: Neuro deficit, acute, stroke suspected. FINDINGS: BRAIN AND VENTRICLES: No acute infarct. There is a heterogeneous extraaxial fluid collection again seen overlying the right cerebral hemisphere, which appears to represent a subdural hematoma. It appears to have mildly increased in size in the interim on both the axial and coronal images, with maximal thickness on the coronal imaging increasing from 15 mm to 18 mm. On the axial images, the fluid collection overlying the right frontal lobe appears to have increased in thickness from 18 mm to 22 mm. There is subarachnoid hemorrhage also present within the right cerebral vertex. No mass. There is approximately 4 mm of shift of the midline structures to the left, similar to prior. No hydrocephalus. There are numerous foci of increased T2 signal scattered throughout the cerebral white matter. The sella is unremarkable. Normal flow voids. ORBITS: No acute abnormality. SINUSES AND MASTOIDS:  No acute abnormality. BONES AND SOFT TISSUES: Normal marrow signal. No acute soft tissue abnormality. IMPRESSION: 1. Heterogeneous extraaxial fluid collection overlying the right cerebral hemisphere, consistent with a subdural hematoma, with mild interval  increase in size. Maximal thickness measures 18 mm on coronal images and 22 mm on axial images. Associated approximately 4 mm leftward midline shift, similar to prior. 2. Subarachnoid hemorrhage within the right cerebral vertex. 3. Numerous foci of increased T2 signal scattered throughout the cerebral white matter. Electronically signed by: Evalene Coho MD 10/01/2024 06:17 AM EST RP Workstation: [id number]   CT HEAD WO CONTRAST Result Date: 10/01/2024 EXAM: CT HEAD WITHOUT CONTRAST 10/01/2024 12:11:09 AM TECHNIQUE: CT of the head was performed without the administration of intravenous contrast. Automated exposure control, iterative reconstruction, and/or weight based adjustment of the mA/kV was utilized to reduce the radiation dose to as low as reasonably achievable. COMPARISON: CT head 09/12/2024 CLINICAL HISTORY: Neuro deficit, acute, stroke suspected FINDINGS: BRAIN AND VENTRICLES: Mild interval decrease in size of a mixed-density right subdural hematoma, which now measures up to 1.8 cm in thickness (previously 1.9 cm when remeasured). Slight improvement in midline shift, now 4 mm (previously 5 mm) No evidence of acute infarct. No hydrocephalus. ORBITS: No acute abnormality. SINUSES: No acute abnormality. SOFT TISSUES AND SKULL: Right craniotomy.  Drain had been removed. No skull fracture. IMPRESSION: 1. Mild interval decrease in size of a mixed-density right subdural hematoma, which now measures up to 1.8 cm in thickness (previously 1.9 cm when remeasured). 2. Slight improvement in midline shift, now 4 mm (previously 5 mm). Electronically signed by: Gilmore Molt MD 10/01/2024 12:28 AM EST RP Workstation: [id number]      Dorn JONELLE Glade 10/02/2024, 9:01 AM
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
## 2024-10-03 ENCOUNTER — Other Ambulatory Visit: Payer: Self-pay | Admitting: Neurological Surgery

## 2024-10-03 ENCOUNTER — Inpatient Hospital Stay (HOSPITAL_COMMUNITY)

## 2024-10-03 LAB — BASIC METABOLIC PANEL WITH GFR
Anion gap: 13 (ref 5–15)
BUN: 15 mg/dL (ref 8–23)
CO2: 23 mmol/L (ref 22–32)
Calcium: 9 mg/dL (ref 8.9–10.3)
Chloride: 101 mmol/L (ref 98–111)
Creatinine, Ser: 1.49 mg/dL — ABNORMAL HIGH (ref 0.61–1.24)
GFR, Estimated: 51 mL/min — ABNORMAL LOW (ref >60)
Glucose, Bld: 155 mg/dL — ABNORMAL HIGH (ref 70–99)
Potassium: 3.6 mmol/L (ref 3.5–5.1)
Sodium: 137 mmol/L (ref 135–145)

## 2024-10-03 LAB — GLUCOSE, CAPILLARY
Glucose-Capillary: 177 mg/dL — ABNORMAL HIGH (ref 70–99)
Glucose-Capillary: 148 mg/dL — ABNORMAL HIGH (ref 70–99)
Glucose-Capillary: 131 mg/dL — ABNORMAL HIGH (ref 70–99)
Glucose-Capillary: 201 mg/dL — ABNORMAL HIGH (ref 70–99)

## 2024-10-03 MED ORDER — VALPROATE SODIUM 100 MG/ML IV SOLN
500.0000 mg | Freq: Two times a day (BID) | INTRAVENOUS | Status: AC
  Administered 2024-10-03 – 2024-10-07 (×8): 500 mg via INTRAVENOUS
  Filled 2024-10-03: qty 500
  Filled 2024-10-03 (×2): qty 5
  Filled 2024-10-03: qty 500
  Filled 2024-10-03 (×3): qty 5
  Filled 2024-10-03: qty 500
  Filled 2024-10-03 (×3): qty 5

## 2024-10-03 MED ORDER — LORAZEPAM 2 MG/ML IJ SOLN
2.0000 mg | Freq: Once | INTRAMUSCULAR | Status: AC
  Administered 2024-10-03: 2 mg via INTRAVENOUS
  Filled 2024-10-03: qty 1

## 2024-10-03 MED ORDER — VALPROATE SODIUM 100 MG/ML IV SOLN
15.0000 mg/kg/d | Freq: Three times a day (TID) | INTRAVENOUS | Status: DC
  Filled 2024-10-03 (×3): qty 2.75

## 2024-10-03 MED ORDER — VALPROATE SODIUM 100 MG/ML IV SOLN
1000.0000 mg | Freq: Once | INTRAVENOUS | Status: AC
  Administered 2024-10-03: 1000 mg via INTRAVENOUS
  Filled 2024-10-03: qty 10
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
## 2024-10-03 NOTE — Progress Notes (Signed)
 LTM EEG hooked up and running - no initial skin breakdown - push button tested - [facility] monitoring.

## 2024-10-03 NOTE — Progress Notes (Addendum)
 PROGRESS NOTE        PATIENT DETAILS Name: Anthony Price Age: 68 y.o. Sex: male Date of Birth: 1956/07/17 Admit Date: 09/30/2024 Admitting Physician Maximino DELENA Sharps, MD ERE:Izuupwhzm, Fonda DELENA, MD  Brief Summary: Patient is a 68 y.o.  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  Significant events: 12/5>> admit to [facility] 12/7>>more seizures-Keppra  bolus-Keppra  maintenance dosage increased to 750 mg BID 12/8>> more seizure overnight-LTM EEG-Depakote started, Keppra  dosage increased  Significant studies: 12/5>> CT head: Mild interval increase in right subdural hematoma 12/6>> MRI brain: Mild interval increase in size of subdural hematoma-18 mm.  Subarachnoid hemorrhage within the right cerebral cortex.  Significant microbiology data: 12/6>> EEG: No seizures  Procedures: None  Consults: Neurosurgery Neurology  Subjective: More seizures overnight-neurology formally consulted overnight-LTM EEG planned for this morning.  Objective: Vitals: Blood pressure 115/84, pulse 99, temperature 97.6 F (36.4 C), temperature source Oral, resp. rate (!) 21, height 5' 7 (1.702 m), weight 54.9 kg, SpO2 98%.   Exam: Awake/alert Chest: Clear to auscultation CVS: S1-S2 regular Abdomen: Soft nontender Mild left-sided weakness this morning.  Pertinent Labs/Radiology:    Latest Ref Rng & Units 10/02/2024    7:40 AM 09/30/2024   10:33 PM 09/12/2024    9:37 AM  CBC  WBC 4.0 - 10.5 K/uL 6.7  8.1  13.3   Hemoglobin 13.0 - 17.0 g/dL 89.2  89.1  89.0   Hematocrit 39.0 - 52.0 % 36.3  35.8  35.3   Platelets 150 - 400 K/uL 128  136  206      Lab Results  Component Value Date   NA 137 10/03/2024   K 3.6 10/03/2024   CL 101 10/03/2024   CO2 23 10/03/2024      Assessment/Plan: SDH Worsening radiologically-likely provoking seizures Neurosurgery following-inpatient MMA embolization being planned.  Seizure disorder Provoked-likely due to SDH Some mild LUE weakness this morning More seizures overnight-Keppra  dosage increased to 1500 mg, started on Depakote-LTM EEG being planned-neurology now officially following.  Wellbutrin  discontinued on 12/7 as it can lower seizure threshold.  DM-2 with uncontrolled hyperglycemia (A1c 7.9 on 11/16)  CBGs now about Continue Lantus  14 units + 4 units of NovoLog  with meals + SSI Follow/optimize  Recent Labs    10/02/24 1639 10/02/24 2121 10/03/24 0817  GLUCAP 111* 147* 177*     Essential hypertension BP stable-continue Coreg /amlodipine     History of heart transplant 2000 Chronic immunosuppressive therapy Continue CellCept  and Prograf  Per most recent echocardiogram at [facility]-normal LV function.   Hypokalemia Repleted   Depression with anxiety Continue Viibryd  and Xanax  Stop Wellbutrin -lowers seizure threshold-patient having more breakthrough seizures this morning.     Hypertriglyceridemia Atorvastatin  and fenofibrate    GERD Protonix   Normocytic anemia Chronic issue-likely due to underlying chronic disease/heart transplant/immunosuppressive's Follow    Code status:   Code Status: Full Code   DVT Prophylaxis: SCDs Start: 10/01/24 0827   Family Communication: None at bedside   Disposition Plan: Status is: Inpatient Remains inpatient appropriate because: Severity of illness   Planned Discharge Destination:Home   Diet: Diet Order  Diet Carb Modified Fluid consistency: Thin; Room service appropriate? Yes  Diet effective now                     Antimicrobial agents: Anti-infectives (From admission, onward)    None         MEDICATIONS: Scheduled Meds:  amLODipine   2.5 mg Oral Daily   atorvastatin   40 mg Oral Daily   carvedilol   12.5 mg Oral BID WC   dapagliflozin  propanediol  10 mg Oral QAC breakfast   insulin  aspart  0-15 Units Subcutaneous TID WC   insulin  aspart  0-5 Units Subcutaneous QHS   insulin  aspart  4 Units Subcutaneous TID WC   insulin  glargine  14 Units Subcutaneous Daily   levETIRAcetam   1,500 mg Oral BID   mycophenolate   250 mg Oral BID   pantoprazole   40 mg Oral BID   sodium chloride  flush  3 mL Intravenous Q12H   tacrolimus   0.5 mg Oral BID   Vilazodone  HCl  20 mg Oral Daily   Continuous Infusions:  valproate sodium      PRN Meds:.acetaminophen  **OR** acetaminophen , albuterol , ALPRAZolam , hydrALAZINE , ondansetron  **OR** ondansetron  (ZOFRAN ) IV, senna   I have personally reviewed following labs and imaging studies  LABORATORY DATA: CBC: Recent Labs  Lab 09/30/24 2233 10/02/24 0740  WBC 8.1 6.7  NEUTROABS 4.5  --   HGB 10.8* 10.7*  HCT 35.8* 36.3*  MCV 75.2* 75.2*  PLT 136* 128*    Basic Metabolic Panel: Recent Labs  Lab 09/30/24 2233 10/02/24 0740 10/03/24 0212  NA 133* 135 137  K 3.1* 4.5 3.6  CL 99 104 101  CO2 23 19* 23  GLUCOSE 258* 287* 155*  BUN 9 15 15   CREATININE 1.10 1.32* 1.49*  CALCIUM  8.7* 9.0 9.0    GFR: Estimated Creatinine Clearance: 37.9 mL/min (A) (by C-G formula based on SCr of 1.49 mg/dL (H)).  Liver Function Tests: Recent Labs  Lab 09/30/24 2233  AST 18  ALT 11  ALKPHOS 53  BILITOT 0.6  PROT 6.7  ALBUMIN 3.6   No results for input(s): LIPASE, AMYLASE in the last 168 hours. No results for input(s): AMMONIA in the last 168 hours.  Coagulation Profile: Recent Labs  Lab 09/30/24 2233  INR 1.1    Cardiac Enzymes: No results for input(s): CKTOTAL, CKMB, CKMBINDEX, TROPONINI in the last 168 hours.  BNP (last 3 results) No results for input(s): PROBNP in the last 8760 hours.  Lipid Profile: No  results for input(s): CHOL, HDL, LDLCALC, TRIG, CHOLHDL, LDLDIRECT in the last 72 hours.  Thyroid  Function Tests: No results for input(s): TSH, T4TOTAL, FREET4, T3FREE, THYROIDAB in the last 72 hours.  Anemia Panel: No results for input(s): VITAMINB12, FOLATE, FERRITIN, TIBC, IRON, RETICCTPCT in the last 72 hours.  Urine analysis:    Component Value Date/Time   COLORURINE STRAW (A) 01/30/2024 2245   APPEARANCEUR CLEAR 01/30/2024 2245   LABSPEC 1.020 01/30/2024 2245   PHURINE 6.0 01/30/2024 2245   GLUCOSEU >=500 (A) 01/30/2024 2245   HGBUR NEGATIVE 01/30/2024 2245   BILIRUBINUR NEGATIVE 01/30/2024 2245   KETONESUR NEGATIVE 01/30/2024 2245   PROTEINUR 30 (A) 01/30/2024 2245   NITRITE NEGATIVE 01/30/2024 2245   LEUKOCYTESUR NEGATIVE 01/30/2024 2245    Sepsis Labs: Lactic Acid, Venous No results found for: LATICACIDVEN  MICROBIOLOGY: No results found for this or any previous visit (from the past 240 hours).  RADIOLOGY STUDIES/RESULTS: EEG adult Result Date: 10/01/2024 Matthews Elida HERO, MD  10/02/2024  7:10 AM Routine EEG Report YAAKOV SAINDON is a 68 y.o. male with a history of altered mental status who is undergoing an EEG to evaluate for seizures. Report: This EEG was acquired with electrodes placed according to the International 10-20 electrode system (including Fp1, Fp2, F3, F4, C3, C4, P3, P4, O1, O2, T3, T4, T5, T6, A1, A2, Fz, Cz, Pz). The following electrodes were missing or displaced: none. The occipital dominant rhythm was 9 Hz. This activity is reactive to stimulation. Drowsiness was manifested by background fragmentation; deeper stages of sleep were identified by K complexes and sleep spindles. There was no focal slowing. There were no interictal epileptiform discharges. There were no electrographic seizures identified. Impression: This EEG was obtained while awake and asleep and is normal.   Clinical Correlation: Normal EEGs, however,  do not rule out epilepsy. Elida Ross, MD [facility] [phone] If 7pm- 7am, please page neurology on call as listed in AMION.     LOS: 2 days   Donalda Applebaum, MD  [facility]    To contact the attending provider between 7A-7P or the covering provider during after hours 7P-7A, please log into the web site [email or web address] and access using universal [place] password for that web site. If you do not have the password, please call the hospital operator.  10/03/2024, 11:06 AM

## 2024-10-03 NOTE — Plan of Care (Signed)
  Problem: Education: Goal: Ability to describe self-care measures that may prevent or decrease complications (Diabetes Survival Skills Education) will improve Outcome: Not Progressing Goal: Individualized Educational Video(s) Outcome: Not Progressing   Problem: Coping: Goal: Ability to adjust to condition or change in health will improve Outcome: Not Progressing   Problem: Fluid Volume: Goal: Ability to maintain a balanced intake and output will improve Outcome: Not Progressing   Problem: Health Behavior/Discharge Planning: Goal: Ability to identify and utilize available resources and services will improve Outcome: Not Progressing Goal: Ability to manage health-related needs will improve Outcome: Not Progressing   Problem: Nutritional: Goal: Maintenance of adequate nutrition will improve Outcome: Not Progressing Goal: Progress toward achieving an optimal weight will improve Outcome: Not Progressing   Problem: Skin Integrity: Goal: Risk for impaired skin integrity will decrease Outcome: Not Progressing   Problem: Tissue Perfusion: Goal: Adequacy of tissue perfusion will improve Outcome: Not Progressing

## 2024-10-03 NOTE — Progress Notes (Signed)
 Inpatient Rehab Admissions Coordinator:   Per therapy recommendations,  patient was screened for CIR candidacy by Leita Kleine, MS, CCC-SLP.   At this time, Pt. is not medically ready for CIR as [facility] plans to take Pt. Back to OR Wednesday. I will not pursue a rehab consult for this Pt. at this time, but CIR admissions team will follow and monitor for medical readiness and place consult order if Pt. appears to be an appropriate candidate. Please contact me with any questions.   Leita Kleine, MS, CCC-SLP Rehab Admissions Coordinator  [phone] (celll) [phone] (office)

## 2024-10-03 NOTE — Evaluation (Signed)
 Physical Therapy Evaluation Patient Details Name: Anthony Price MRN: [id number] DOB: 1956-06-13 Today's Date: 10/03/2024  History of Present Illness  68 year old male who returns 09/30/24 after recent admission with R SDH 11/16 with right frontotemporoparietal craniotomy. 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 Had witnessed seizure in ED.  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 at [facility], pulmonary hypertension, HTN, CKD, HLD, NIDDM, depression/anxiety  Clinical Impression  Pt admitted with above diagnosis. Since d/c home from last admission pt had returned to independence including ambulation without AD and cooking.  He presents not with L inattention, decreased balance with posterior bias in standing, cognitive deficits, and worsening of receptive and expressive speech. Pt required 2 person min A for safety with ambulation. Was on LT EEG during eval. No signs of seizures during time of eval and VSS. Patient will benefit from intensive inpatient follow-up therapy, >3 hours/day. Pt currently with functional limitations due to the deficits listed below (see PT Problem List). Pt will benefit from acute skilled PT to increase their independence and safety with mobility to allow discharge.           If plan is discharge home, recommend the following: A lot of help with walking and/or transfers;A little help with bathing/dressing/bathroom;Assistance with cooking/housework;Help with stairs or ramp for entrance;Assist for transportation;Supervision due to cognitive status   Can travel by private vehicle        Equipment Recommendations Other (comment) (TBD with further mobility)  Recommendations for Other Services  Speech consult;Rehab consult    Functional Status Assessment Patient has had a recent decline in their functional status and demonstrates the ability to make  significant improvements in function in a reasonable and predictable amount of time.     Precautions / Restrictions Precautions Precautions: Fall Recall of Precautions/Restrictions: Impaired Precaution/Restrictions Comments: pt with very slow processing Restrictions Weight Bearing Restrictions Per Provider Order: No Other Position/Activity Restrictions: pt receiving LT EEG on eval      Mobility  Bed Mobility Overal bed mobility: Modified Independent             General bed mobility comments: able to come up to long sitting and pivot to EOB    Transfers Overall transfer level: Needs assistance Equipment used: None Transfers: Sit to/from Stand Sit to Stand: Min assist, +2 safety/equipment           General transfer comment: min A for balance due to posterior bias, +2 for safety and mgmt of lines/EEG    Ambulation/Gait Ambulation/Gait assistance: Min assist, +2 safety/equipment Gait Distance (Feet): 50 Feet   Gait Pattern/deviations: Step-through pattern, Narrow base of support, Drifts right/left Gait velocity: decreased Gait velocity interpretation: <1.8 ft/sec, indicate of risk for recurrent falls Pre-gait activities: marching in place, unsteady General Gait Details: pt with narrow BOS and posterior bias with decreased awareness of balance deficits. Drifts R and L and needs tactile cues for directions  Stairs            Wheelchair Mobility     Tilt Bed    Modified Rankin (Stroke Patients Only) Modified Rankin (Stroke Patients Only) Pre-Morbid Rankin Score: No symptoms Modified Rankin: Moderately severe disability     Balance Overall balance assessment: Needs assistance, History of Falls Sitting-balance support: Feet supported, No upper extremity supported Sitting balance-Leahy Scale: Fair Sitting balance - Comments: posterior lean without LOB in sitting Postural control: Posterior lean Standing  balance support: No upper extremity supported,  During functional activity Standing balance-Leahy Scale: Poor Standing balance comment: unable to correct for perturbation, decreased awareness of posterior lean                             Pertinent Vitals/Pain Pain Assessment Pain Assessment: 0-10 Pain Score: 3  Pain Location: head Pain Descriptors / Indicators: Headache Pain Intervention(s): Limited activity within patient's tolerance, Monitored during session    Home Living Family/patient expects to be discharged to:: Private residence Living Arrangements: Spouse/significant other Available Help at Discharge: Family;Available 24 hours/day Type of Home: Mobile home Home Access: Stairs to enter Entrance Stairs-Rails: Right;Left;Can reach both Entrance Stairs-Number of Steps: 5   Home Layout: One level Home Equipment: None Additional Comments: pt's girlfriend does not drive but is otherwise independent. Sister helps with driving and med appts    Prior Function Prior Level of Function : Independent/Modified Independent             Mobility Comments: had returned to independence except driving. Was doing the cooking and all self care ADLs Comments: independent, had begun showering again once staples were removed     Extremity/Trunk Assessment   Upper Extremity Assessment Upper Extremity Assessment: Defer to OT evaluation;Right hand dominant    Lower Extremity Assessment Lower Extremity Assessment: LLE deficits/detail LLE Deficits / Details: strength WFL but lacks full coordination of L side LLE Sensation: WNL LLE Coordination: decreased gross motor    Cervical / Trunk Assessment Cervical / Trunk Assessment: Normal  Communication   Communication Communication: Impaired Factors Affecting Communication: Difficulty expressing self;Other (comment) (receptive difficulties present as well)    Cognition Arousal: Alert Behavior During Therapy: WFL for tasks assessed/performed   PT - Cognitive  impairments: Attention, Initiation, Sequencing, Problem solving, Safety/Judgement, Awareness Difficult to assess due to: Impaired communication                     PT - Cognition Comments: pt AxOx4 but all responses slow with expressive difficulties that pt reports have regressed since last d/c. Pt with L side inattention but able to attend when cued. Following commands: Impaired Following commands impaired: Follows one step commands inconsistently (when cued to use L he initially uses right)     Cueing Cueing Techniques: Verbal cues, Tactile cues, Gestural cues, Visual cues     General Comments General comments (skin integrity, edema, etc.): HR 114 bpm with ambulation, SPO2 99% throughout on RA, BP 112/74 in supine, 119/88 in sitting, 131/80 after ambulation.    Exercises     Assessment/Plan    PT Assessment Patient needs continued PT services  PT Problem List Decreased activity tolerance;Decreased mobility;Pain;Decreased balance;Decreased strength;Decreased coordination;Decreased cognition;Decreased safety awareness;Decreased knowledge of precautions       PT Treatment Interventions Gait training;Stair training;Functional mobility training;Therapeutic activities;Balance training;Patient/family education;DME instruction;Therapeutic exercise;Neuromuscular re-education;Cognitive remediation    PT Goals (Current goals can be found in the Care Plan section)  Acute Rehab PT Goals Patient Stated Goal: unable to state; agrees with PT goals PT Goal Formulation: With patient Time For Goal Achievement: 10/17/24 Potential to Achieve Goals: Good    Frequency Min 2X/week     Co-evaluation PT/OT/SLP Co-Evaluation/Treatment: Yes Reason for Co-Treatment: Complexity of the patient's impairments (multi-system involvement);For patient/therapist safety;Other (comment) PT goals addressed during session: Mobility/safety with mobility;Balance OT goals addressed during session: ADL's and  self-care       AM-PAC PT 6 Clicks Mobility  Outcome Measure Help needed turning from your back to your side while in a flat bed without using bedrails?: None Help needed moving from lying on your back to sitting on the side of a flat bed without using bedrails?: None Help needed moving to and from a bed to a chair (including a wheelchair)?: A Lot Help needed standing up from a chair using your arms (e.g., wheelchair or bedside chair)?: A Little Help needed to walk in hospital room?: Total Help needed climbing 3-5 steps with a railing? : Total 6 Click Score: 15    End of Session Equipment Utilized During Treatment: Gait belt Activity Tolerance: Patient tolerated treatment well Patient left: with call bell/phone within reach;in bed;with bed alarm set;with nursing/sitter in room Nurse Communication: Mobility status;Other (comment) (need for SLP eval) PT Visit Diagnosis: Difficulty in walking, not elsewhere classified (R26.2);Pain;Unsteadiness on feet (R26.81) Pain - part of body:  (head)    Time: 9090-9051 PT Time Calculation (min) (ACUTE ONLY): 39 min   Charges:   PT Evaluation $PT Eval Moderate Complexity: 1 Mod PT Treatments $Gait Training: 8-22 mins PT General Charges $$ ACUTE PT VISIT: 1 Visit         Richerd Lipoma, PT  Acute Rehab Services Secure chat preferred Office [phone]   Richerd CROME Tarren Velardi 10/03/2024, 10:46 AM
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
## 2024-10-03 NOTE — Progress Notes (Signed)
@  2230- I was informed by the secretary that pt has a seizure, when I went to checked , pt was having twitching activities on face and head deviation to left, it last for about .  Made MD Franky aware, received the stat order for neuro consult and loading dose of IV keppra  2000mg   @0515 - pt again has witnessed seizure, at this time he was having jerking movement of left hand, twitching activities on face and head deviation to left, last for around 2 min. Made MD aware, gave iv ativan  2mg .  Will continue the ongoing plan of care.

## 2024-10-03 NOTE — Progress Notes (Signed)
 Patient ID: Anthony Price, male   DOB: [birth year], 68 y.o.   MRN: [id number] Pt seen and examined. HCT and MRI reviewed. He is awake and alert, MAEx4, but appears he is having frequent szs. Therefor it is probably best to consider re-operation instead of MMA embolization. We will plan for Wednesday when OR time is available.

## 2024-10-03 NOTE — Plan of Care (Signed)
  Problem: Health Behavior/Discharge Planning: Goal: Ability to manage health-related needs will improve Outcome: Progressing   Problem: Clinical Measurements: Goal: Will remain free from infection Outcome: Progressing   Problem: Nutrition: Goal: Adequate nutrition will be maintained Outcome: Progressing   Problem: Safety: Goal: Ability to remain free from injury will improve Outcome: Progressing
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
## 2024-10-03 NOTE — Evaluation (Signed)
 Occupational Therapy Evaluation Patient Details Name: Anthony Price MRN: [id number] DOB: [birth year] Today's Date: 10/03/2024   History of Present Illness   68 year old male who returns 09/30/24 after recent admission with R SDH 11/16 with right frontotemporoparietal craniotomy. 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 Had witnessed seizure in ED.  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  PTA he was living at home with assist as needed from SO.  Patient was increasing his independence with mobility and ADL completion.  Deficits impacting function are listed below.  Currently he is needing up to +2 Min A for mobility and up to Mod A for ADL completion from a sit to stand level.  OT will follow in the acute setting and Patient will benefit from intensive inpatient follow-up therapy, >3 hours/day given new impairments and new neuro impairments.         If plan is discharge home, recommend the following:   A lot of help with bathing/dressing/bathroom;Two people to help with walking and/or transfers;Direct supervision/assist for medications management;Assist for transportation;Assistance with cooking/housework     Functional Status Assessment   Patient has had a recent decline in their functional status and demonstrates the ability to make significant improvements in function in a reasonable and predictable amount of time.     Equipment Recommendations   Tub/shower seat     Recommendations for Other Services         Precautions/Restrictions   Precautions Precautions: Other (comment) Recall of Precautions/Restrictions: Intact Precaution/Restrictions Comments: expressive difficulties improving Restrictions Weight  Bearing Restrictions Per Provider Order: No     Mobility Bed Mobility Overal bed mobility: Modified Independent                  Transfers Overall transfer level: Needs assistance Equipment used: None Transfers: Sit to/from Stand Sit to Stand: Min assist, +2 safety/equipment           General transfer comment: min A for balance due to posterior bias, +2 for safety and mgmt of lines/EEG      Balance Overall balance assessment: Needs assistance, History of Falls Sitting-balance support: Feet supported, No upper extremity supported Sitting balance-Leahy Scale: Fair   Postural control: Posterior lean Standing balance support: No upper extremity supported, During functional activity Standing balance-Leahy Scale: Poor Standing balance comment: unable to correct for perturbation, decreased awareness of posterior lean                           ADL either performed or assessed with clinical judgement   ADL Overall ADL's : Needs assistance/impaired Eating/Feeding: Set up;Sitting   Grooming: Wash/dry hands;Wash/dry face;Minimal assistance;Sitting;Standing   Upper Body Bathing: Minimal assistance;Cueing for safety;Sitting   Lower Body Bathing: Moderate assistance;Sit to/from stand   Upper Body Dressing : Minimal assistance;Sitting   Lower Body Dressing: Moderate assistance;Sit to/from stand   Toilet Transfer: Minimal assistance;+2 for safety/equipment;+2 for physical assistance;Ambulation;Regular Toilet                   Vision Baseline Vision/History: 1 Wears glasses Vision Assessment?: No apparent visual deficits Additional Comments: R head trun, but able to to trun his head left and cross midline with eyes.     Perception Perception: Impaired Preception  Impairment Details: Inattention/Neglect Perception-Other Comments: Mild to the R   Praxis Praxis: Impaired Praxis Impairment Details: Limb apraxia     Pertinent Vitals/Pain Pain  Assessment Pain Assessment: Faces Faces Pain Scale: Hurts a little bit Pain Location: head Pain Descriptors / Indicators: Headache Pain Intervention(s): Monitored during session     Extremity/Trunk Assessment Upper Extremity Assessment Upper Extremity Assessment: Right hand dominant;LUE deficits/detail LUE Deficits / Details: [place] 3+/5 LUE Sensation: decreased light touch LUE Coordination: decreased fine motor;decreased gross motor   Lower Extremity Assessment Lower Extremity Assessment: Defer to PT evaluation LLE Deficits / Details: strength WFL but lacks full coordination of L side LLE Sensation: WNL LLE Coordination: decreased gross motor   Cervical / Trunk Assessment Cervical / Trunk Assessment: Normal   Communication Communication Communication: Impaired Factors Affecting Communication: Difficulty expressing self;Other (comment)   Cognition Arousal: Alert Behavior During Therapy: WFL for tasks assessed/performed Cognition: Cognition impaired         Attention impairment (select first level of impairment): Sustained attention Executive functioning impairment (select all impairments): Problem solving                   Following commands: Impaired Following commands impaired: Follows one step commands inconsistently     Cueing  General Comments   Cueing Techniques: Verbal cues;Tactile cues;Gestural cues;Visual cues  HR 114 bpm with ambulation, SPO2 99% throughout on RA, BP 112/74 in supine, 119/88 in sitting, 131/80 after ambulation.   Exercises     Shoulder Instructions      Home Living Family/patient expects to be discharged to:: Private residence Living Arrangements: Spouse/significant other Available Help at Discharge: Family;Available 24 hours/day Type of Home: Mobile home Home Access: Stairs to enter Entrance Stairs-Number of Steps: 5 Entrance Stairs-Rails: Right;Left;Can reach both Home Layout: One level     Bathroom Shower/Tub:  Chief Strategy Officer: Standard Bathroom Accessibility: No   Home Equipment: None   Additional Comments: pt's girlfriend does not drive but is otherwise independent. Sister helps with driving and med appts  Lives With: Significant other    Prior Functioning/Environment Prior Level of Function : Independent/Modified Independent             Mobility Comments: had returned to independence except driving. Was doing the cooking and all self care ADLs Comments: independent, had begun showering again once staples were removed    OT Problem List: Decreased strength;Impaired balance (sitting and/or standing);Decreased coordination;Impaired sensation;Impaired UE functional use;Decreased safety awareness   OT Treatment/Interventions: Self-care/ADL training;Therapeutic activities;Therapeutic exercise;Neuromuscular education;Patient/family education;Balance training;DME and/or AE instruction      OT Goals(Current goals can be found in the care plan section)   Acute Rehab OT Goals Patient Stated Goal: Return home OT Goal Formulation: With patient Time For Goal Achievement: 10/17/24 Potential to Achieve Goals: Good ADL Goals Pt Will Perform Grooming: with modified independence;standing Pt Will Perform Upper Body Bathing: with modified independence;sitting Pt Will Perform Lower Body Bathing: with modified independence;sit to/from stand Pt Will Perform Upper Body Dressing: with modified independence;sitting Pt Will Perform Lower Body Dressing: with modified independence;sit to/from stand Pt Will Transfer to Toilet: with modified independence;ambulating;regular height toilet   OT Frequency:  Min 2X/week    Co-evaluation PT/OT/SLP Co-Evaluation/Treatment: Yes Reason for Co-Treatment: Complexity of the patient's impairments (multi-system involvement);For patient/therapist safety;Other (comment) PT goals addressed during session: Mobility/safety with mobility;Balance OT goals  addressed during session: ADL's and self-care      AM-PAC OT 6 Clicks Daily Activity     Outcome  Measure Help from another person eating meals?: A Little Help from another person taking care of personal grooming?: A Little Help from another person toileting, which includes using toliet, bedpan, or urinal?: A Lot Help from another person bathing (including washing, rinsing, drying)?: A Lot Help from another person to put on and taking off regular upper body clothing?: A Little Help from another person to put on and taking off regular lower body clothing?: A Lot 6 Click Score: 15   End of Session Equipment Utilized During Treatment: Gait belt Nurse Communication: Mobility status  Activity Tolerance: Patient tolerated treatment well Patient left: in bed;with call bell/phone within reach;with nursing/sitter in room  OT Visit Diagnosis: Unsteadiness on feet (R26.81);Muscle weakness (generalized) (M62.81);Ataxia, unspecified (R27.0);Hemiplegia and hemiparesis Hemiplegia - Right/Left: Left Hemiplegia - dominant/non-dominant: Non-Dominant Hemiplegia - caused by: Other Nontraumatic intracranial hemorrhage                Time: 0918-0950 OT Time Calculation (min): 32 min Charges:  OT General Charges $OT Visit: 1 Visit OT Evaluation $OT Eval Moderate Complexity: 1 Mod  10/03/2024  RP, OTR/L  Acute Rehabilitation Services  Office:  [phone]   [name] 10/03/2024, 10:53 AM

## 2024-10-04 ENCOUNTER — Inpatient Hospital Stay (HOSPITAL_COMMUNITY)

## 2024-10-04 LAB — BASIC METABOLIC PANEL WITH GFR
Anion gap: 16 — ABNORMAL HIGH (ref 5–15)
BUN: 17 mg/dL (ref 8–23)
CO2: 18 mmol/L — ABNORMAL LOW (ref 22–32)
Calcium: 8.8 mg/dL — ABNORMAL LOW (ref 8.9–10.3)
Chloride: 104 mmol/L (ref 98–111)
Creatinine, Ser: 1.59 mg/dL — ABNORMAL HIGH (ref 0.61–1.24)
GFR, Estimated: 48 mL/min — ABNORMAL LOW (ref >60)
Glucose, Bld: 111 mg/dL — ABNORMAL HIGH (ref 70–99)
Potassium: 3.4 mmol/L — ABNORMAL LOW (ref 3.5–5.1)
Sodium: 138 mmol/L (ref 135–145)

## 2024-10-04 LAB — GLUCOSE, CAPILLARY
Glucose-Capillary: 121 mg/dL — ABNORMAL HIGH (ref 70–99)
Glucose-Capillary: 129 mg/dL — ABNORMAL HIGH (ref 70–99)
Glucose-Capillary: 183 mg/dL — ABNORMAL HIGH (ref 70–99)
Glucose-Capillary: 135 mg/dL — ABNORMAL HIGH (ref 70–99)

## 2024-10-04 LAB — TYPE AND SCREEN
ABO/RH(D): B POS
Antibody Screen: NEGATIVE

## 2024-10-04 LAB — VALPROIC ACID LEVEL: Valproic Acid Lvl: 73 ug/mL (ref 50–100)

## 2024-10-04 LAB — MAGNESIUM: Magnesium: 1.8 mg/dL (ref 1.7–2.4)

## 2024-10-04 MED ORDER — POTASSIUM CHLORIDE CRYS ER 20 MEQ PO TBCR
40.0000 meq | EXTENDED_RELEASE_TABLET | Freq: Once | ORAL | Status: AC
  Administered 2024-10-04: 40 meq via ORAL
  Filled 2024-10-04: qty 2

## 2024-10-04 MED ORDER — CHLORHEXIDINE GLUCONATE CLOTH 2 % EX PADS
6.0000 | MEDICATED_PAD | Freq: Once | CUTANEOUS | Status: AC
  Administered 2024-10-05: 6 via TOPICAL

## 2024-10-04 MED ORDER — CEFAZOLIN SODIUM-DEXTROSE 2-4 GM/100ML-% IV SOLN
2.0000 g | INTRAVENOUS | Status: AC
  Administered 2024-10-05: 2 g via INTRAVENOUS
  Filled 2024-10-04: qty 100

## 2024-10-04 NOTE — Progress Notes (Signed)
 NEUROLOGY CONSULT FOLLOW UP NOTE   Date of service: October 04, 2024 Patient Name: Anthony Price MRN:  [id number] DOB:  06-11-1956  Interval Hx/subjective   No family at the bedside. NO new neurological events overnight. He remains on LTM . LTM read with no seizures.   Vitals   Vitals:   10/04/24 0000 10/04/24 0400 10/04/24 0745 10/04/24 0800  BP: 102/64 133/76 113/78 105/69  Pulse: 77 87  92  Resp: 13 13  13   Temp: (!) 97.2 F (36.2 C) (!) 97.2 F (36.2 C) 97.6 F (36.4 C)   TempSrc: Oral Oral Oral   SpO2: 97% 99%  99%  Weight:      Height:         Body mass index is 18.95 kg/m.  Physical Exam   Constitutional: Appears well-developed and well-nourished.   Psych: Affect appropriate to situation.   Eyes: No scleral injection.   HENT: No OP obstrucion.   Head: Normocephalic.   Cardiovascular: Normal rate and regular rhythm.   Respiratory: Effort normal, non-labored breathing.   GI: Soft.  No distension. There is no tenderness.   Skin: WDI.    Neurologic Examination   Mental Status -  Level of arousal and orientation to time, place, and person were intact . Language including expression, naming, repetition, comprehension was assessed and found intact . Slow speech.  Attention span and concentration were normal . Recent and remote memory were intact . Fund of Knowledge was assessed and was intact .  Cranial Nerves II - XII - II - Visual field intact OU . III, IV, VI - Extraocular movements intact can track . V - Facial sensation intact bilaterally . VII - left facial  VIII - Hearing & vestibular intact bilaterally . X - Palate elevates symmetrically . XI - Chin turning & shoulder shrug intact bilaterally . XII - Tongue protrusion intact .  Motor Strength - The patient's strength was normal in right arm and right leg. Left arm 4/5 with drift and weak left grip 2/5, left leg with drift 4/5 Bulk was normal and fasciculations were absent .   Sensory -  symmetric bilaterally to light touch  Coordination - no ataxia  Gait and Station - deferred.  Medications  Current Facility-Administered Medications:    acetaminophen  (TYLENOL ) tablet 650 mg, 650 mg, Oral, Q6H PRN, 650 mg at 10/03/24 1338 **OR** acetaminophen  (TYLENOL ) suppository 650 mg, 650 mg, Rectal, Q6H PRN, Anthony Price, Anthony A, MD   albuterol  (PROVENTIL ) (2.5 MG/3ML) 0.083% nebulizer solution 2.5 mg, 2.5 mg, Nebulization, Q6H PRN, Smith, Anthony A, MD   ALPRAZolam  (XANAX ) tablet 1 mg, 1 mg, Oral, TID PRN, Smith, Anthony A, MD, 1 mg at 10/03/24 9060   amLODipine  (NORVASC ) tablet 2.5 mg, 2.5 mg, Oral, Daily, Smith, Anthony A, MD, 2.5 mg at 10/03/24 9060   atorvastatin  (LIPITOR) tablet 40 mg, 40 mg, Oral, Daily, Smith, Anthony A, MD, 40 mg at 10/04/24 0910   carvedilol  (COREG ) tablet 12.5 mg, 12.5 mg, Oral, BID WC, Smith, Anthony A, MD, 12.5 mg at 10/04/24 0910   dapagliflozin  propanediol (FARXIGA ) tablet 10 mg, 10 mg, Oral, QAC breakfast, Anthony Price, Anthony A, MD, 10 mg at 10/03/24 2109   hydrALAZINE  (APRESOLINE ) injection 10 mg, 10 mg, Intravenous, Q4H PRN, Smith, Anthony A, MD   insulin  aspart (novoLOG ) injection 0-15 Units, 0-15 Units, Subcutaneous, TID WC, Smith, Anthony A, MD, 2 Units at 10/03/24 1850   insulin  aspart (novoLOG ) injection 0-5 Units, 0-5 Units, Subcutaneous, QHS, Smith, Anthony A,  MD, 2 Units at 10/03/24 2110   insulin  aspart (novoLOG ) injection 4 Units, 4 Units, Subcutaneous, TID WC, Anthony Price, Anthony HERO, MD, 4 Units at 10/04/24 0911   insulin  glargine (LANTUS ) injection 14 Units, 14 Units, Subcutaneous, Daily, Anthony Price, Anthony HERO, MD, 14 Units at 10/04/24 0912   levETIRAcetam  (KEPPRA ) tablet 1,500 mg, 1,500 mg, Oral, BID, Anthony Price, Eric, MD, 1,500 mg at 10/04/24 9089   mycophenolate  (CELLCEPT ) capsule 250 mg, 250 mg, Oral, BID, Anthony Price, Anthony A, MD, 250 mg at 10/04/24 0910   ondansetron  (ZOFRAN ) tablet 4 mg, 4 mg, Oral, Q6H PRN, 4 mg at 10/02/24 1709 **OR** ondansetron  (ZOFRAN )  injection 4 mg, 4 mg, Intravenous, Q6H PRN, Anthony Price, Anthony A, MD   pantoprazole  (PROTONIX ) EC tablet 40 mg, 40 mg, Oral, BID, Anthony Price, Anthony A, MD, 40 mg at 10/04/24 0910   senna (SENOKOT) tablet 8.6-17.2 mg, 1-2 tablet, Oral, Daily PRN, Anthony Price, Anthony RAMAN, MD, 17.2 mg at 10/01/24 2157   sodium chloride  flush (NS) 0.9 % injection 3 mL, 3 mL, Intravenous, Q12H, Smith, Anthony A, MD, 3 mL at 10/04/24 9086   tacrolimus  (PROGRAF ) capsule 0.5 mg, 0.5 mg, Oral, BID, Smith, Anthony A, MD, 0.5 mg at 10/04/24 0913   valproate (DEPACON ) 500 mg in dextrose  5 % 50 mL IVPB, 500 mg, Intravenous, Q12H, Anthony Koloski, MD, Last Rate: 55 mL/hr at 10/04/24 0916, 500 mg at 10/04/24 9083   Vilazodone  HCl TABS 20 mg, 20 mg, Oral, Daily, Anthony Price Reeves A, MD, 20 mg at 10/04/24 0912  Labs and Diagnostic Imaging   CBC:  Recent Labs  Lab 09/30/24 2233 10/02/24 0740  WBC 8.1 6.7  NEUTROABS 4.5  --   HGB 10.8* 10.7*  HCT 35.8* 36.3*  MCV 75.2* 75.2*  PLT 136* 128*    Basic Metabolic Panel:  Lab Results  Component Value Date   NA 138 10/04/2024   K 3.4 (L) 10/04/2024   CO2 18 (L) 10/04/2024   GLUCOSE 111 (H) 10/04/2024   BUN 17 10/04/2024   CREATININE 1.59 (H) 10/04/2024   CALCIUM  8.8 (L) 10/04/2024   GFRNONAA 48 (L) 10/04/2024   GFRAA 60 10/04/2020   Lipid Panel:  Lab Results  Component Value Date   LDLCALC 61 08/10/2024   HgbA1c:  Lab Results  Component Value Date   HGBA1C 7.9 (H) 09/11/2024   Urine Drug Screen: No results found for: LABOPIA, COCAINSCRNUR, LABBENZ, AMPHETMU, THCU, LABBARB  Alcohol Level     Component Value Date/Time   [facility] <15 09/30/2024 2233   INR  Lab Results  Component Value Date   INR 1.1 09/30/2024   APTT  Lab Results  Component Value Date   APTT 27 09/30/2024   rEEG 12/6:  Normal   LTM EEG: pending  Assessment   Anthony Price is Price 68 y.o. male  PMHx of CHF, heart transplant (on chronic immunosuppressant therapy with tacrolimus ),  uncontrolled DM2, and recent right craniotomy 3 weeks previously for evacuation of SDH who presented to the hospital on 12/5 with new onset of LUE weakness and neglect since the prior day. Imaging revealed possible reaccumulation of blood in the subdural space, with 4 mm of midline shift. Of note, the patient had seizure activity in the ED in which the patient stopped talking and had jerking motions of the head for approximately 30 seconds. He was started on Keppra  at that time. EEG performed on 12/6 was normal. Neurosurgery has been following and anticipates inpatient MMA embolization. He had another seizure this afternoon and  one this evening (focal LUE jerking x 45 seconds) despite being on Keppra  750 mg BID. Neurology was called to further evaluate. The patient currently endorses left arm weakness but also has significant left sided neglect. No other complaints.   IMP: Seizure in the setting of right hemispheric SDH  Recommendations  Seizure precautions  Continue Keppra  1500mg  BID Continue Depakote 500mg  BID Check VPA level today- ordered Discontinue LTM Management of SDH per neurosurgery- likely OR tomorrow for evacuation.  May need future consideration for MMA embolization if continues to have recurrent SDH. Neurology will sign off.  Please call with questions as needed.  Plan discussed with Dr. Raenelle ______________________________________________________________________   Signed, Karna DELENA Geralds, NP [facility]  Attending Neurohospitalist Addendum Patient seen and examined with APP/Resident. Agree with the history and physical as documented above. Agree with the plan as documented, which I helped formulate. I have independently reviewed the chart, obtained history, review of systems and examined the patient.I have personally reviewed pertinent head/neck/spine imaging (CT/MRI).  Remains seizure-free over the last 24 hours.  Plan for SDH evacuation per neurosurgery tomorrow.   No further changes from our side.  Will be available with questions as needed  I discussed my plan with the patient and Dr. Raenelle, [facility].  -- Eligio Lav, MD Neurologist [facility] Pager: [phone]

## 2024-10-04 NOTE — Progress Notes (Signed)
 Patient ID: Anthony Price, male   DOB: October 08, 1956, 68 y.o.   MRN: [id number] Clinically he looks good. Neurology in room, no drift, no obvious sz activity  Plan is for re-do craniotomy tomorrow for SDH. I have discussed this with him and he has demonstrated understanding. Npo after MN

## 2024-10-04 NOTE — Progress Notes (Signed)
 PROGRESS NOTE        PATIENT DETAILS Name: Anthony Price Age: 68 y.o. Sex: male Date of Birth: Mar 21, 1956 Admit Date: 09/30/2024 Admitting Physician Maximino DELENA Sharps, MD ERE:[email or web address], Fonda DELENA, MD  Brief Summary: Patient is a 68 y.o.  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  Significant events: 12/5>> admit to [facility] 12/7>>more seizures-Keppra  bolus-Keppra  maintenance dosage increased to 750 mg BID 12/8>> more seizure overnight-LTM EEG-Depakote started, Keppra  dosage increased  Significant studies: 12/5>> CT head: Mild interval increase in right subdural hematoma 12/6>> MRI brain: Mild interval increase in size of subdural hematoma-18 mm.  Subarachnoid hemorrhage within the right cerebral cortex. 12/8-12/9>> LTM EEG: No seizures  Significant microbiology data: 12/6>> EEG: No seizures  Procedures: None  Consults: Neurosurgery Neurology  Subjective: No major issues-does not think he had any further seizures overnight.  Objective: Vitals: Blood pressure 105/69, pulse 92, temperature 97.6 F (36.4 C), temperature source Oral, resp. rate 13, height 5' 7 (1.702 m), weight 54.9 kg, SpO2 99%.   Exam: Awake/alert Minimal left upper extremity weakness Abdomen: Soft nontender nondistended Extremities: No edema  Pertinent Labs/Radiology:    Latest Ref Rng & Units 10/02/2024    7:40 AM 09/30/2024   10:33 PM 09/12/2024    9:37 AM  CBC  WBC 4.0 - 10.5 K/uL 6.7  8.1  13.3   Hemoglobin 13.0 - 17.0 g/dL 89.2  89.1  89.0   Hematocrit 39.0 - 52.0 % 36.3  35.8  35.3   Platelets 150 - 400 K/uL 128  136  206     Lab  Results  Component Value Date   NA 138 10/04/2024   K 3.4 (L) 10/04/2024   CL 104 10/04/2024   CO2 18 (L) 10/04/2024      Assessment/Plan: SDH Worsening radiologically-likely provoking seizures Minimal LUE persists Neurosurgery following-redo craniotomy on 12/10.  Seizure disorder Provoked-likely due to SDH On Keppra /Depakote per neurology LTM EEG overnight negative  Wellbutrin  discontinued on 12/7 as it can lower seizure threshold.  DM-2 with uncontrolled hyperglycemia (A1c 7.9 on 11/16)  CBGs now about Continue Lantus  14 units + 4 units of NovoLog  with meals + SSI Follow/optimize  Recent Labs    10/03/24 1618 10/03/24 2048 10/04/24 0747  GLUCAP 131* 201* 121*     Essential hypertension BP stable-continue Coreg /amlodipine    CKD stage IIIa Close to baseline Follow   History of heart transplant 2000 Chronic immunosuppressive therapy Continue CellCept  and Prograf  Per most recent echocardiogram at [facility]-normal LV function.   Hypokalemia Replete/recheck   Depression with anxiety Continue Viibryd  and Xanax  No longer on Wellbutrin -see above   Hypertriglyceridemia Atorvastatin  and fenofibrate    GERD Protonix   Normocytic anemia Chronic issue-likely due to underlying chronic disease/heart transplant/immunosuppressive's Follow    Code status:   Code Status: Full Code   DVT Prophylaxis: SCDs Start: 10/01/24 0827   Family Communication: None at bedside   Disposition Plan: Status is: Inpatient Remains inpatient appropriate because: Severity of illness   Planned Discharge Destination:Home   Diet: Diet Order             Diet NPO time specified Except for: Noralyn  with Meds  Diet effective midnight           Diet Carb Modified Fluid consistency: Thin; Room service appropriate? Yes  Diet effective now                     Antimicrobial agents: Anti-infectives (From admission, onward)    None        MEDICATIONS: Scheduled Meds:   amLODipine   2.5 mg Oral Daily   atorvastatin   40 mg Oral Daily   carvedilol   12.5 mg Oral BID WC   dapagliflozin  propanediol  10 mg Oral QAC breakfast   insulin  aspart  0-15 Units Subcutaneous TID WC   insulin  aspart  0-5 Units Subcutaneous QHS   insulin  aspart  4 Units Subcutaneous TID WC   insulin  glargine  14 Units Subcutaneous Daily   levETIRAcetam   1,500 mg Oral BID   mycophenolate   250 mg Oral BID   pantoprazole   40 mg Oral BID   sodium chloride  flush  3 mL Intravenous Q12H   tacrolimus   0.5 mg Oral BID   Vilazodone  HCl  20 mg Oral Daily   Continuous Infusions:  valproate sodium  500 mg (10/04/24 0916)   PRN Meds:.acetaminophen  **OR** acetaminophen , albuterol , ALPRAZolam , hydrALAZINE , ondansetron  **OR** ondansetron  (ZOFRAN ) IV, senna   I have personally reviewed following labs and imaging studies  LABORATORY DATA: CBC: Recent Labs  Lab 09/30/24 2233 10/02/24 0740  WBC 8.1 6.7  NEUTROABS 4.5  --   HGB 10.8* 10.7*  HCT 35.8* 36.3*  MCV 75.2* 75.2*  PLT 136* 128*    Basic Metabolic Panel: Recent Labs  Lab 09/30/24 2233 10/02/24 0740 10/03/24 0212 10/04/24 0301  NA 133* 135 137 138  K 3.1* 4.5 3.6 3.4*  CL 99 104 101 104  CO2 23 19* 23 18*  GLUCOSE 258* 287* 155* 111*  BUN 9 15 15 17   CREATININE 1.10 1.32* 1.49* 1.59*  CALCIUM  8.7* 9.0 9.0 8.8*  MG  --   --   --  1.8    GFR: Estimated Creatinine Clearance: 35.5 mL/min (A) (by C-G formula based on SCr of 1.59 mg/dL (H)).  Liver Function Tests: Recent Labs  Lab 09/30/24 2233  AST 18  ALT 11  ALKPHOS 53  BILITOT 0.6  PROT 6.7  ALBUMIN 3.6   No results for input(s): LIPASE, AMYLASE in the last 168 hours. No results for input(s): AMMONIA in the last 168 hours.  Coagulation Profile: Recent Labs  Lab 09/30/24 2233  INR 1.1    Cardiac Enzymes: No results for input(s): CKTOTAL, CKMB, CKMBINDEX, TROPONINI in the last 168 hours.  BNP (last 3 results) No results for input(s):  PROBNP in the last 8760 hours.  Lipid Profile: No results for input(s): CHOL, HDL, LDLCALC, TRIG, CHOLHDL, LDLDIRECT in the last 72 hours.  Thyroid  Function Tests: No results for input(s): TSH, T4TOTAL, FREET4, T3FREE, THYROIDAB in the last 72 hours.  Anemia Panel: No results for input(s): VITAMINB12, FOLATE, FERRITIN, TIBC, IRON, RETICCTPCT in the last 72 hours.  Urine analysis:    Component Value Date/Time   COLORURINE STRAW (A) 01/30/2024 2245   APPEARANCEUR CLEAR 01/30/2024 2245   LABSPEC 1.020 01/30/2024 2245   PHURINE 6.0 01/30/2024 2245   GLUCOSEU >=500 (A) 01/30/2024 2245   HGBUR NEGATIVE 01/30/2024 2245   BILIRUBINUR NEGATIVE 01/30/2024 2245   KETONESUR NEGATIVE 01/30/2024 2245   PROTEINUR 30 (A) 01/30/2024 2245   NITRITE NEGATIVE 01/30/2024 2245   LEUKOCYTESUR NEGATIVE 01/30/2024 2245    Sepsis  Labs: Lactic Acid, Venous No results found for: LATICACIDVEN  MICROBIOLOGY: No results found for this or any previous visit (from the past 240 hours).  RADIOLOGY STUDIES/RESULTS: Overnight EEG with video Result Date: 10/04/2024 Shelton Arlin KIDD, MD     10/04/2024  9:13 AM Patient Name: KUZEY OGATA MRN: [id number] Epilepsy Attending: Arlin KIDD Shelton Referring Physician/Provider: Voncile Isles, MD Duration: 10/03/2024 9188 to 10/04/2024 9188 Patient history:  68 y.o. male with a history of altered mental status who is undergoing an EEG to evaluate for seizures. Level of alertness: Awake, asleep AEDs during EEG study: LEV, VPA Technical aspects: This EEG study was done with scalp electrodes positioned according to the 10-20 International system of electrode placement. Electrical activity was reviewed with band pass filter of 1-70Hz , sensitivity of 7 uV/mm, display speed of 18mm/sec with a 60Hz  notched filter applied as appropriate. EEG data were recorded continuously and digitally stored.  Video monitoring was available and reviewed as  appropriate. Description: The posterior dominant rhythm consists of 8-9 Hz activity of moderate voltage (25-35 uV) seen predominantly in posterior head regions, symmetric and reactive to eye opening and eye closing. Sleep was characterized by vertex waves, sleep spindles (12 to 14 Hz), maximal frontocentral region. EEG showed continuous generalized 3 to 6 Hz theta-delta slowing in right fronto-temporal region. Hyperventilation and photic stimulation were not performed.   ABNORMALITY - Continuous slow, right fronto-temporal region IMPRESSION: This study is suggestive of cortical dysfunction arising from right fronto-temporal region, likely secondary to underlying structural abnormality. No seizures or epileptiform discharges were seen throughout the recording. Priyanka KIDD Shelton     LOS: 3 days   Donalda Applebaum, MD  [facility]    To contact the attending provider between 7A-7P or the covering provider during after hours 7P-7A, please log into the web site [email or web address] and access using universal  password for that web site. If you do not have the password, please call the hospital operator.  10/04/2024, 10:03 AM

## 2024-10-04 NOTE — Progress Notes (Signed)
 Speech Language Pathology  Patient Details Name: SUFYAN MEIDINGER MRN: [id number] DOB: 09-Apr-1956 Today's Date: 10/04/2024 Time:  -     Orders for speech-language-cog assessment, however pt having re-do craniotomy 12/10- will defer until after surgery    Dustin Olam Bull  10/04/2024, 8:41 AM

## 2024-10-04 NOTE — Progress Notes (Signed)
 LTM EEG discontinued - no skin breakdown at [facility].

## 2024-10-04 NOTE — Procedures (Signed)
 Patient Name: Anthony Price  MRN: [id number]  Epilepsy Attending: Arlin MALVA Krebs  Referring Physician/Provider: Voncile Isles, MD  Duration: 10/04/2024 0811 to 10/04/2024 1028   Patient history:  68 y.o. male with a history of altered mental status who is undergoing an EEG to evaluate for seizures.    Level of alertness: Awake, asleep   AEDs during EEG study: LEV, VPA   Technical aspects: This EEG study was done with scalp electrodes positioned according to the 10-20 International system of electrode placement. Electrical activity was reviewed with band pass filter of 1-70Hz , sensitivity of 7 uV/mm, display speed of 79mm/sec with a 60Hz  notched filter applied as appropriate. EEG data were recorded continuously and digitally stored.  Video monitoring was available and reviewed as appropriate.   Description: The posterior dominant rhythm consists of 8-9 Hz activity of moderate voltage (25-35 uV) seen predominantly in posterior head regions, symmetric and reactive to eye opening and eye closing. Sleep was characterized by vertex waves, sleep spindles (12 to 14 Hz), maximal frontocentral region. EEG showed continuous generalized 3 to 6 Hz theta-delta slowing in right fronto-temporal region. Hyperventilation and photic stimulation were not performed.      ABNORMALITY - Continuous slow, right fronto-temporal region   IMPRESSION: This study is suggestive of cortical dysfunction arising from right fronto-temporal region, likely secondary to underlying structural abnormality. No seizures or epileptiform discharges were seen throughout the recording.   Joellyn Grandt O Maddux First

## 2024-10-04 NOTE — Care Management Important Message (Signed)
 Important Message  Patient Details  Name: Anthony Price MRN: [id number] Date of Birth: [birth year]   Important Message Given:  Yes - Medicare IM     Claretta Deed 10/04/2024, 4:15 PM

## 2024-10-04 NOTE — Progress Notes (Signed)
    PROCEDURAL EXPEDITER PROGRESS NOTE  Patient Name: Anthony Price  DOB:Feb 11, 1956 Date of Admission: 09/30/2024  Date of Assessment:10/04/24   -------------------------------------------------------------------------------------------------------------------   Brief clinical summary: pt is in patient with Hx of  heart transplantin 2000 on cellcept , CKD. DM, HTN, anxoety and depression.  Is having surgery today for craniotomy hematoma evacuation subdural on right.   Orders in place:  Yes   Communication with surgical team if no orders: n/a  Labs, test, and orders reviewed: n/a  Requires surgical clearance:  No  What type of clearance: n/a  Clearance received: n/a  Barriers noted:limited verbal   Intervention provided by [facility] team:  n/a   Barrier resolved:  no   -------------------------------------------------------------------------------------------------------------------  [employer], Ronal DELENA Bald Please contact us  directly via secure chat (search for [facility]) or by calling us  at [phone] [facility]).

## 2024-10-04 NOTE — Procedures (Addendum)
 Patient Name: Anthony Price  MRN: [id number]  Epilepsy Attending: Arlin MALVA Krebs  Referring Physician/Provider: Voncile Isles, MD  Duration: 10/03/2024 9188 to 10/04/2024 9188  Patient history:  68 y.o. male with a history of altered mental status who is undergoing an EEG to evaluate for seizures.   Level of alertness: Awake, asleep  AEDs during EEG study: LEV, VPA  Technical aspects: This EEG study was done with scalp electrodes positioned according to the 10-20 International system of electrode placement. Electrical activity was reviewed with band pass filter of 1-70Hz , sensitivity of 7 uV/mm, display speed of 42mm/sec with a 60Hz  notched filter applied as appropriate. EEG data were recorded continuously and digitally stored.  Video monitoring was available and reviewed as appropriate.  Description: The posterior dominant rhythm consists of 8-9 Hz activity of moderate voltage (25-35 uV) seen predominantly in posterior head regions, symmetric and reactive to eye opening and eye closing. Sleep was characterized by vertex waves, sleep spindles (12 to 14 Hz), maximal frontocentral region. EEG showed continuous generalized 3 to 6 Hz theta-delta slowing in right fronto-temporal region. Hyperventilation and photic stimulation were not performed.     ABNORMALITY - Continuous slow, right fronto-temporal region  IMPRESSION: This study is suggestive of cortical dysfunction arising from right fronto-temporal region, likely secondary to underlying structural abnormality. No seizures or epileptiform discharges were seen throughout the recording.  Wayburn Shaler O Kole Hilyard

## 2024-10-05 ENCOUNTER — Inpatient Hospital Stay (HOSPITAL_COMMUNITY): Payer: Self-pay | Admitting: Anesthesiology

## 2024-10-05 ENCOUNTER — Encounter (HOSPITAL_COMMUNITY): Payer: Self-pay | Admitting: Internal Medicine

## 2024-10-05 ENCOUNTER — Encounter (HOSPITAL_COMMUNITY): Admission: EM | Disposition: A | Payer: Self-pay | Source: Home / Self Care | Attending: Internal Medicine

## 2024-10-05 ENCOUNTER — Ambulatory Visit: Payer: Self-pay

## 2024-10-05 DIAGNOSIS — E1165 Type 2 diabetes mellitus with hyperglycemia: Secondary | ICD-10-CM | POA: Diagnosis not present

## 2024-10-05 DIAGNOSIS — R569 Unspecified convulsions: Secondary | ICD-10-CM | POA: Diagnosis not present

## 2024-10-05 DIAGNOSIS — Z9889 Other specified postprocedural states: Secondary | ICD-10-CM | POA: Diagnosis not present

## 2024-10-05 DIAGNOSIS — E785 Hyperlipidemia, unspecified: Secondary | ICD-10-CM

## 2024-10-05 DIAGNOSIS — E119 Type 2 diabetes mellitus without complications: Secondary | ICD-10-CM

## 2024-10-05 HISTORY — PX: CRANIOTOMY: SHX93

## 2024-10-05 LAB — BASIC METABOLIC PANEL WITH GFR
Anion gap: 9 (ref 5–15)
BUN: 20 mg/dL (ref 8–23)
CO2: 24 mmol/L (ref 22–32)
Calcium: 8.8 mg/dL — ABNORMAL LOW (ref 8.9–10.3)
Chloride: 106 mmol/L (ref 98–111)
Creatinine, Ser: 1.48 mg/dL — ABNORMAL HIGH (ref 0.61–1.24)
GFR, Estimated: 52 mL/min — ABNORMAL LOW (ref >60)
Glucose, Bld: 120 mg/dL — ABNORMAL HIGH (ref 70–99)
Potassium: 3.4 mmol/L — ABNORMAL LOW (ref 3.5–5.1)
Sodium: 139 mmol/L (ref 135–145)

## 2024-10-05 LAB — CBC
HCT: 37 % — ABNORMAL LOW (ref 39.0–52.0)
Hemoglobin: 11.4 g/dL — ABNORMAL LOW (ref 13.0–17.0)
MCH: 22.9 pg — ABNORMAL LOW (ref 26.0–34.0)
MCHC: 30.8 g/dL (ref 30.0–36.0)
MCV: 74.4 fL — ABNORMAL LOW (ref 80.0–100.0)
Platelets: 164 K/uL (ref 150–400)
RBC: 4.97 MIL/uL (ref 4.22–5.81)
RDW: 16.5 % — ABNORMAL HIGH (ref 11.5–15.5)
WBC: 5.8 K/uL (ref 4.0–10.5)
nRBC: 0 % (ref 0.0–0.2)

## 2024-10-05 LAB — GLUCOSE, CAPILLARY
Glucose-Capillary: 130 mg/dL — ABNORMAL HIGH (ref 70–99)
Glucose-Capillary: 86 mg/dL (ref 70–99)
Glucose-Capillary: 56 mg/dL — ABNORMAL LOW (ref 70–99)
Glucose-Capillary: 129 mg/dL — ABNORMAL HIGH (ref 70–99)
Glucose-Capillary: 114 mg/dL — ABNORMAL HIGH (ref 70–99)
Glucose-Capillary: 235 mg/dL — ABNORMAL HIGH (ref 70–99)

## 2024-10-05 LAB — MAGNESIUM: Magnesium: 1.8 mg/dL (ref 1.7–2.4)

## 2024-10-05 SURGERY — CRANIOTOMY HEMATOMA EVACUATION SUBDURAL
Anesthesia: General | Laterality: Right

## 2024-10-05 MED ORDER — ORAL CARE MOUTH RINSE: 15.0000 mL | Freq: Once | OROMUCOSAL | Status: AC

## 2024-10-05 MED ORDER — MORPHINE SULFATE (PF) 2 MG/ML IV SOLN: 1.0000 mg | INTRAVENOUS | Status: DC | PRN

## 2024-10-05 MED ORDER — ACETAMINOPHEN 500 MG PO TABS: 1000.0000 mg | ORAL_TABLET | Freq: Once | ORAL | Status: DC

## 2024-10-05 MED ORDER — ROCURONIUM BROMIDE 10 MG/ML (PF) SYRINGE
PREFILLED_SYRINGE | INTRAVENOUS | Status: AC
  Filled 2024-10-05: qty 10

## 2024-10-05 MED ORDER — ONDANSETRON HCL 4 MG/2ML IJ SOLN
INTRAMUSCULAR | Status: AC
  Filled 2024-10-05: qty 6

## 2024-10-05 MED ORDER — ACETAMINOPHEN 325 MG PO TABS: 650.0000 mg | ORAL_TABLET | ORAL | Status: DC | PRN

## 2024-10-05 MED ORDER — ONDANSETRON HCL 4 MG/2ML IJ SOLN
INTRAMUSCULAR | Status: AC
  Filled 2024-10-05: qty 2

## 2024-10-05 MED ORDER — PHENYLEPHRINE 80 MCG/ML (10ML) SYRINGE FOR IV PUSH (FOR BLOOD PRESSURE SUPPORT)
PREFILLED_SYRINGE | INTRAVENOUS | Status: DC | PRN
  Administered 2024-10-05: 160 ug via INTRAVENOUS

## 2024-10-05 MED ORDER — INSULIN GLARGINE 100 UNIT/ML ~~LOC~~ SOLN
10.0000 [IU] | Freq: Every day | SUBCUTANEOUS | Status: DC
  Filled 2024-10-05: qty 0.1

## 2024-10-05 MED ORDER — 0.9 % SODIUM CHLORIDE (POUR BTL) OPTIME
TOPICAL | Status: DC | PRN
  Administered 2024-10-05: 3000 mL

## 2024-10-05 MED ORDER — DEXAMETHASONE SODIUM PHOSPHATE 4 MG/ML IJ SOLN
4.0000 mg | Freq: Four times a day (QID) | INTRAMUSCULAR | Status: AC
  Administered 2024-10-06 – 2024-10-07 (×4): 4 mg via INTRAVENOUS
  Filled 2024-10-05 (×4): qty 1

## 2024-10-05 MED ORDER — ONDANSETRON HCL 4 MG/2ML IJ SOLN
INTRAMUSCULAR | Status: DC | PRN
  Administered 2024-10-05: 4 mg via INTRAVENOUS

## 2024-10-05 MED ORDER — FENTANYL CITRATE (PF) 100 MCG/2ML IJ SOLN
25.0000 ug | INTRAMUSCULAR | Status: DC | PRN
  Administered 2024-10-05: 50 ug via INTRAVENOUS

## 2024-10-05 MED ORDER — SUGAMMADEX SODIUM 200 MG/2ML IV SOLN
INTRAVENOUS | Status: AC
  Filled 2024-10-05: qty 2

## 2024-10-05 MED ORDER — SUGAMMADEX SODIUM 200 MG/2ML IV SOLN
INTRAVENOUS | Status: DC | PRN
  Administered 2024-10-05: 200 mg via INTRAVENOUS

## 2024-10-05 MED ORDER — THROMBIN 5000 UNITS EX KIT
PACK | CUTANEOUS | Status: AC
  Filled 2024-10-05: qty 1

## 2024-10-05 MED ORDER — THROMBIN 20000 UNITS EX SOLR
CUTANEOUS | Status: AC
  Filled 2024-10-05: qty 20000

## 2024-10-05 MED ORDER — LIDOCAINE 2% (20 MG/ML) 5 ML SYRINGE
INTRAMUSCULAR | Status: AC
  Filled 2024-10-05: qty 5

## 2024-10-05 MED ORDER — LIDOCAINE 2% (20 MG/ML) 5 ML SYRINGE
INTRAMUSCULAR | Status: DC | PRN
  Administered 2024-10-05: 60 mg via INTRAVENOUS

## 2024-10-05 MED ORDER — PHENYLEPHRINE 80 MCG/ML (10ML) SYRINGE FOR IV PUSH (FOR BLOOD PRESSURE SUPPORT)
PREFILLED_SYRINGE | INTRAVENOUS | Status: AC
  Filled 2024-10-05: qty 10

## 2024-10-05 MED ORDER — BACITRACIN ZINC 500 UNIT/GM EX OINT
TOPICAL_OINTMENT | CUTANEOUS | Status: AC
  Filled 2024-10-05: qty 28.35

## 2024-10-05 MED ORDER — BACITRACIN ZINC 500 UNIT/GM EX OINT
TOPICAL_OINTMENT | CUTANEOUS | Status: DC | PRN
  Administered 2024-10-05: 1 via TOPICAL

## 2024-10-05 MED ORDER — DEXTROSE 50 % IV SOLN: 25.0000 g | INTRAVENOUS | Status: AC

## 2024-10-05 MED ORDER — THROMBIN 20000 UNITS EX SOLR
CUTANEOUS | Status: DC | PRN
  Administered 2024-10-05: 20 mL via TOPICAL

## 2024-10-05 MED ORDER — SODIUM CHLORIDE 0.9 % IV SOLN: INTRAVENOUS | Status: DC | PRN

## 2024-10-05 MED ORDER — DEXAMETHASONE SOD PHOSPHATE PF 10 MG/ML IJ SOLN
6.0000 mg | Freq: Four times a day (QID) | INTRAMUSCULAR | Status: AC
  Administered 2024-10-05 – 2024-10-06 (×4): 6 mg via INTRAVENOUS

## 2024-10-05 MED ORDER — HEMOSTATIC AGENTS (NO CHARGE) OPTIME
TOPICAL | Status: DC | PRN
  Administered 2024-10-05: 1 via TOPICAL

## 2024-10-05 MED ORDER — DEXAMETHASONE SOD PHOSPHATE PF 10 MG/ML IJ SOLN
INTRAMUSCULAR | Status: DC | PRN
  Administered 2024-10-05: 8 mg via INTRAVENOUS

## 2024-10-05 MED ORDER — DEXTROSE 50 % IV SOLN
INTRAVENOUS | Status: AC
  Administered 2024-10-05: 25 g via INTRAVENOUS
  Filled 2024-10-05: qty 50

## 2024-10-05 MED ORDER — POTASSIUM CHLORIDE IN NACL 20-0.9 MEQ/L-% IV SOLN
INTRAVENOUS | Status: DC
  Filled 2024-10-05 (×3): qty 1000

## 2024-10-05 MED ORDER — ACETAMINOPHEN 650 MG RE SUPP: 650.0000 mg | RECTAL | Status: DC | PRN

## 2024-10-05 MED ORDER — ONDANSETRON HCL 4 MG/2ML IJ SOLN: 4.0000 mg | Freq: Once | INTRAMUSCULAR | Status: DC | PRN

## 2024-10-05 MED ORDER — FENTANYL CITRATE (PF) 250 MCG/5ML IJ SOLN
INTRAMUSCULAR | Status: DC | PRN
  Administered 2024-10-05: 25 ug via INTRAVENOUS
  Administered 2024-10-05: 50 ug via INTRAVENOUS

## 2024-10-05 MED ORDER — DEXTROSE 50 % IV SOLN: 12.5000 g | INTRAVENOUS | Status: DC

## 2024-10-05 MED ORDER — THROMBIN 5000 UNITS EX SOLR
OROMUCOSAL | Status: DC | PRN
  Administered 2024-10-05: 5 mL via TOPICAL

## 2024-10-05 MED ORDER — PROPOFOL 10 MG/ML IV BOLUS
INTRAVENOUS | Status: DC | PRN
  Administered 2024-10-05: 80 mg via INTRAVENOUS
  Administered 2024-10-05: 50 mg via INTRAVENOUS

## 2024-10-05 MED ORDER — CHLORHEXIDINE GLUCONATE 0.12 % MT SOLN
OROMUCOSAL | Status: AC
  Administered 2024-10-05: 15 mL via OROMUCOSAL
  Filled 2024-10-05: qty 15

## 2024-10-05 MED ORDER — SUGAMMADEX SODIUM 200 MG/2ML IV SOLN
INTRAVENOUS | Status: AC
  Filled 2024-10-05: qty 4

## 2024-10-05 MED ORDER — DEXAMETHASONE SODIUM PHOSPHATE 4 MG/ML IJ SOLN
4.0000 mg | Freq: Three times a day (TID) | INTRAMUSCULAR | Status: DC
  Administered 2024-10-07 – 2024-10-08 (×2): 4 mg via INTRAVENOUS
  Filled 2024-10-05 (×2): qty 1

## 2024-10-05 MED ORDER — PROPOFOL 10 MG/ML IV BOLUS
INTRAVENOUS | Status: AC
  Filled 2024-10-05: qty 20

## 2024-10-05 MED ORDER — FENTANYL CITRATE (PF) 250 MCG/5ML IJ SOLN
INTRAMUSCULAR | Status: AC
  Filled 2024-10-05: qty 5

## 2024-10-05 MED ORDER — ESMOLOL HCL 100 MG/10ML IV SOLN
INTRAVENOUS | Status: AC
  Filled 2024-10-05: qty 10

## 2024-10-05 MED ORDER — FENTANYL CITRATE (PF) 100 MCG/2ML IJ SOLN
INTRAMUSCULAR | Status: AC
  Filled 2024-10-05: qty 2

## 2024-10-05 MED ORDER — HYDROMORPHONE HCL 1 MG/ML IJ SOLN
INTRAMUSCULAR | Status: AC
  Filled 2024-10-05: qty 0.5

## 2024-10-05 MED ORDER — LIDOCAINE-EPINEPHRINE 1 %-1:100000 IJ SOLN
INTRAMUSCULAR | Status: AC
  Filled 2024-10-05: qty 1

## 2024-10-05 MED ORDER — ROCURONIUM BROMIDE 10 MG/ML (PF) SYRINGE
PREFILLED_SYRINGE | INTRAVENOUS | Status: DC | PRN
  Administered 2024-10-05: 50 mg via INTRAVENOUS
  Administered 2024-10-05: 10 mg via INTRAVENOUS

## 2024-10-05 MED ORDER — CHLORHEXIDINE GLUCONATE 0.12 % MT SOLN: 15.0000 mL | Freq: Once | OROMUCOSAL | Status: AC

## 2024-10-05 MED ORDER — PHENYLEPHRINE HCL-NACL 20-0.9 MG/250ML-% IV SOLN
INTRAVENOUS | Status: DC | PRN
  Administered 2024-10-05: 30 ug/min via INTRAVENOUS

## 2024-10-05 MED ORDER — SODIUM CHLORIDE 0.9 % IV SOLN: INTRAVENOUS | Status: DC

## 2024-10-05 MED ORDER — LIDOCAINE-EPINEPHRINE 1 %-1:100000 IJ SOLN
INTRAMUSCULAR | Status: DC | PRN
  Administered 2024-10-05: 4 mL

## 2024-10-05 MED ORDER — HYDROCODONE-ACETAMINOPHEN 5-325 MG PO TABS: 1.0000 | ORAL_TABLET | ORAL | Status: DC | PRN

## 2024-10-05 MED ORDER — CHLORHEXIDINE GLUCONATE CLOTH 2 % EX PADS
6.0000 | MEDICATED_PAD | Freq: Every day | CUTANEOUS | Status: DC
  Administered 2024-10-05 – 2024-10-07 (×4): 6 via TOPICAL

## 2024-10-05 SURGICAL SUPPLY — 49 items
BAG COUNTER SPONGE SURGICOUNT (BAG) ×1 IMPLANT
BUR SPIRAL ROUTER 2.3 (BUR) ×1 IMPLANT
CANISTER SUCTION 3000ML PPV (SUCTIONS) ×1 IMPLANT
CATH VENTRIC 35X38 W/TROCAR LG (CATHETERS) IMPLANT
CLIP TI MEDIUM 6 (CLIP) IMPLANT
DRAIN JP 7F FLT 3/4 PRF SI HBL (DRAIN) IMPLANT
DRAPE MICROSCOPE LEICA (MISCELLANEOUS) IMPLANT
DRAPE NEUROLOGICAL W/INCISE (DRAPES) ×1 IMPLANT
DRAPE SURG 17X23 STRL (DRAPES) IMPLANT
DRAPE WARM FLUID 44X44 (DRAPES) ×1 IMPLANT
DURAPREP 6ML APPLICATOR 50/CS (WOUND CARE) ×1 IMPLANT
ELECTRODE REM PT RTRN 9FT ADLT (ELECTROSURGICAL) ×1 IMPLANT
EVACUATOR 1/8 PVC DRAIN (DRAIN) IMPLANT
EVACUATOR SILICONE 100CC (DRAIN) IMPLANT
GAUZE 4X4 16PLY ~~LOC~~+RFID DBL (SPONGE) IMPLANT
GAUZE SPONGE 4X4 12PLY STRL (GAUZE/BANDAGES/DRESSINGS) ×1 IMPLANT
GLOVE BIO SURGEON STRL SZ7 (GLOVE) ×1 IMPLANT
GLOVE BIO SURGEON STRL SZ8 (GLOVE) ×1 IMPLANT
GLOVE BIOGEL PI IND STRL 7.0 (GLOVE) ×1 IMPLANT
GOWN STRL REUS W/ TWL LRG LVL3 (GOWN DISPOSABLE) ×1 IMPLANT
GOWN STRL REUS W/ TWL XL LVL3 (GOWN DISPOSABLE) ×1 IMPLANT
GOWN STRL REUS W/TWL 2XL LVL3 (GOWN DISPOSABLE) IMPLANT
HEMOSTAT POWDER KIT SURGIFOAM (HEMOSTASIS) ×1 IMPLANT
KIT BASIN OR (CUSTOM PROCEDURE TRAY) ×1 IMPLANT
KIT DRAIN CSF ACCUDRAIN (MISCELLANEOUS) IMPLANT
KIT TURNOVER KIT B (KITS) ×1 IMPLANT
NDL HYPO 22X1.5 SAFETY MO (MISCELLANEOUS) ×1 IMPLANT
NEEDLE HYPO 22X1.5 SAFETY MO (MISCELLANEOUS) ×1 IMPLANT
PACK CRANIOTOMY CUSTOM (CUSTOM PROCEDURE TRAY) ×1 IMPLANT
PAD ARMBOARD POSITIONER FOAM (MISCELLANEOUS) ×1 IMPLANT
PATTIES SURGICAL .5 X3 (DISPOSABLE) IMPLANT
PERFORATOR LRG 14-11MM (BIT) ×1 IMPLANT
PIN MAYFIELD SKULL DISP (PIN) IMPLANT
SCREW UNIII AXS SD 1.5X4 (Screw) IMPLANT
SOLN 0.9% NACL POUR BTL 1000ML (IV SOLUTION) ×1 IMPLANT
SOLN STERILE WATER BTL 1000 ML (IV SOLUTION) ×1 IMPLANT
SPONGE NEURO XRAY DETECT 1X3 (DISPOSABLE) IMPLANT
SPONGE SURGIFOAM ABS GEL 100 (HEMOSTASIS) ×1 IMPLANT
STAPLER SKIN PROX 35W (STAPLE) ×1 IMPLANT
SUT 3-0 BLK 1X30 PSL (SUTURE) IMPLANT
SUT NURALON 4 0 TR CR/8 (SUTURE) ×2 IMPLANT
SUT VIC AB 2-0 CP2 18 (SUTURE) ×1 IMPLANT
SYR CONTROL 10ML LL (SYRINGE) ×1 IMPLANT
TAPE CLOTH SURG 4X10 WHT LF (GAUZE/BANDAGES/DRESSINGS) IMPLANT
TOWEL GREEN STERILE (TOWEL DISPOSABLE) ×1 IMPLANT
TOWEL GREEN STERILE FF (TOWEL DISPOSABLE) ×1 IMPLANT
TRAY FOLEY MTR SLVR 16FR STAT (SET/KITS/TRAYS/PACK) ×1 IMPLANT
TUBING FEATHERFLOW (TUBING) IMPLANT
UNDERPAD 30X36 HEAVY ABSORB (UNDERPADS AND DIAPERS) IMPLANT

## 2024-10-05 NOTE — Transfer of Care (Signed)
 Immediate Anesthesia Transfer of Care Note  Patient: Anthony Price  Procedure(s) Performed: CRANIOTOMY HEMATOMA EVACUATION SUBDURAL (Right)  Patient Location: PACU  Anesthesia Type:General  Level of Consciousness: drowsy  Airway & Oxygen Therapy: Patient connected to face mask oxygen  Post-op Assessment: Report given to RN and Post -op Vital signs reviewed and stable  Post vital signs: Reviewed and stable  Last Vitals:  Vitals Value Taken Time  BP 144/76 10/05/24 13:15  Temp    Pulse 74 10/05/24 13:17  Resp 8 10/05/24 13:17  SpO2 100 % 10/05/24 13:17  Vitals shown include unfiled device data.  Last Pain:  Vitals:   10/05/24 1002  TempSrc: Oral  PainSc: 2       Patients Stated Pain Goal: 0 (10/03/24 2059)  Complications: No notable events documented.

## 2024-10-05 NOTE — Anesthesia Preprocedure Evaluation (Addendum)
 Anesthesia Evaluation  Patient identified by MRN, date of birth, ID band Patient confused    Reviewed: Allergy & Precautions, NPO status , Patient's Chart, lab work & pertinent test results, reviewed documented beta blocker date and time   History of Anesthesia Complications (+) PONV and history of anesthetic complications  Airway Mallampati: II  TM Distance: >3 FB Neck ROM: Full    Dental  (+) Dental Advisory Given, Edentulous Upper, Edentulous Lower   Pulmonary former smoker   Pulmonary exam normal breath sounds clear to auscultation       Cardiovascular hypertension, Pt. on medications and Pt. on home beta blockers + Past MI and +CHF  Normal cardiovascular exam Rhythm:Regular Rate:Normal     Neuro/Psych Seizures -,  PSYCHIATRIC DISORDERS Anxiety Depression    Subdural hematoma    GI/Hepatic Neg liver ROS,GERD  Medicated,,  Endo/Other  diabetes, Type 2, Oral Hypoglycemic Agents, Insulin  Dependent    Renal/GU Renal InsufficiencyRenal disease     Musculoskeletal negative musculoskeletal ROS (+)    Abdominal   Peds  Hematology  (+) Blood dyscrasia, anemia   Anesthesia Other Findings Day of surgery medications reviewed with the patient.  Reproductive/Obstetrics                              Anesthesia Physical Anesthesia Plan  ASA: 4  Anesthesia Plan: General   Post-op Pain Management: Ofirmev  IV (intra-op)*   Induction: Intravenous  PONV Risk Score and Plan: 3 and Dexamethasone  and Ondansetron   Airway Management Planned: Oral ETT  Additional Equipment:   Intra-op Plan:   Post-operative Plan: Possible Post-op intubation/ventilation  Informed Consent: I have reviewed the patients History and Physical, chart, labs and discussed the procedure including the risks, benefits and alternatives for the proposed anesthesia with the patient or authorized representative who has indicated  his/her understanding and acceptance.     Dental advisory given  Plan Discussed with: CRNA  Anesthesia Plan Comments:          Anesthesia Quick Evaluation

## 2024-10-05 NOTE — Progress Notes (Signed)
 10/05/2024 Seen for postop care.  Hx heart transplant here with recurrent subdural manifesting as seizure taken for  repeat right craniotomy.  Uncomplicated surgery, arrives to unit in no distress, drain in place.  Aox3, moving everything, oriented.  Maintain normotension, continue keppra  and anticonvulsants as ordered.  Rolan Sharps MD PCCM

## 2024-10-05 NOTE — Op Note (Signed)
 10/05/2024  1:16 PM  PATIENT:  Anthony Price  68 y.o. 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 Imaging showed recurrent right subdural hematoma about 3 weeks after craniotomy for subdural.. Recommended repeat evacuation of the subdural hematoma. Patient understood the risks, benefits, and alternatives and potential outcomes and wished to proceed.  PROCEDURE DETAILS: The patient was taken to the operating room and after induction of adequate generalized endotracheal anesthesia, the head was turned to the left to expose the right frontotemporal parietal region. The head was shaved and then cleaned with Hibiclens  and alcohol and then prepped with DuraPrep and draped in the usual sterile fashion. 10 cc of local anesthetic was injected, and a curvilinear incision was made on the right of the head through the same old incision. Raney clips were placed to establish hemostasis of the scalp, the muscle was reflected with the scalp flap, to expose the the old craniotomy.  We removed the screws from the dog bone plates and removed the craniotomy flap.  The flap was then placed in bacitracin -containing saline solution, and the dura was opened to expose the underlying hematoma. A hematoma was then removed with a combination of irrigation and suction. I continued to irrigate until the irrigant was clear to, and dried any bleeding with bipolar cautery.  There appeared to be 2 loculated areas superior and medial to the craniotomy.  These were opened.   The membrane was vascular and bled a fair amount.  We controlled this with bipolar cautery and with Surgifoam.  We then irrigated until the irrigant was clear.  I then placed a subdural drain through separate stab incision and close the dura with a running 4-0 Nurolon suture. Dural tack up sutures were placed. The dura was lined with Gelfoam, and the craniotomy flap was replaced with doggie-bone plates. The wound was copiously irrigated.  the galea was then closed with interrupted 2-0 Vicryl suture. The skin was then closed with staples a sterile dressing was applied. The patient was then taken out of the 3-point [place] headrest and awakened from general anesthesia, and transported to the recovery room in stable condition. At the end of the procedure all sponge, needle, and instrument counts were correct.    PLAN OF CARE: Admit to inpatient   PATIENT DISPOSITION:  PACU - hemodynamically stable.   Delay start of Pharmacological VTE agent (>24hrs) due to surgical blood loss or risk of bleeding:  yes

## 2024-10-05 NOTE — Anesthesia Postprocedure Evaluation (Signed)
 Anesthesia Post Note  Patient: TAKOTA CAHALAN  Procedure(s) Performed: CRANIOTOMY HEMATOMA EVACUATION SUBDURAL (Right)     Patient location during evaluation: PACU Anesthesia Type: General Level of consciousness: awake and alert Pain management: pain level controlled Vital Signs Assessment: post-procedure vital signs reviewed and stable Respiratory status: spontaneous breathing, nonlabored ventilation, respiratory function stable and patient connected to nasal cannula oxygen Cardiovascular status: blood pressure returned to baseline and stable Postop Assessment: no apparent nausea or vomiting Anesthetic complications: no   No notable events documented.  Last Vitals:  Vitals:   10/05/24 1630 10/05/24 1645  BP: (!) 143/79 (!) 143/79  Pulse:    Resp: 13 10  Temp:    SpO2: 100% 100%    Last Pain:  Vitals:   10/05/24 1645  TempSrc:   PainSc: 0-No pain    LLE Motor Response: Purposeful movement (10/05/24 1645) LLE Sensation: Full sensation (10/05/24 1645) RLE Motor Response: Purposeful movement (10/05/24 1645) RLE Sensation: Full sensation (10/05/24 1645)      Garnette FORBES Skillern
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
## 2024-10-05 NOTE — Plan of Care (Signed)
   Problem: Fluid Volume: Goal: Ability to maintain a balanced intake and output will improve Outcome: Progressing

## 2024-10-05 NOTE — Progress Notes (Signed)
 PT Cancellation Note  Patient Details Name: Anthony Price MRN: [id number] DOB: 1956/10/05   Cancelled Treatment:    Reason Eval/Treat Not Completed: (P) Patient at procedure or test/unavailable (Pt in OR for repeat craniotomy. Will continue to follow per PT POC.)   Darryle George 10/05/2024, 11:23 AM

## 2024-10-05 NOTE — Anesthesia Procedure Notes (Signed)
 Procedure Name: Intubation Date/Time: 10/05/2024 11:31 AM  Performed by: Boyce Shilling, CRNAPre-anesthesia Checklist: Patient identified, Emergency Drugs available, Suction available, Timeout performed and Patient being monitored Patient Re-evaluated:Patient Re-evaluated prior to induction Oxygen Delivery Method: Circle system utilized Preoxygenation: Pre-oxygenation with 100% oxygen Induction Type: IV induction Ventilation: Mask ventilation without difficulty Laryngoscope Size: Mac and 4 Grade View: Grade I Tube type: Oral Tube size: 7.5 mm Number of attempts: 1 Airway Equipment and Method: Stylet Placement Confirmation: ETT inserted through vocal cords under direct vision, positive ETCO2, CO2 detector and breath sounds checked- equal and bilateral Secured at: 23 cm Tube secured with: Tape Dental Injury: Teeth and Oropharynx as per pre-operative assessment
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
## 2024-10-05 NOTE — Progress Notes (Signed)
 PROGRESS NOTE        PATIENT DETAILS Name: Anthony Price Age: 68 y.o. Sex: male Date of Birth: July 17, 1956 Admit Date: 09/30/2024 Admitting Physician Maximino DELENA Sharps, MD ERE:Izuupwhzm, Fonda DELENA, MD  Brief Summary: Patient is a 68 y.o.  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  Significant events: 12/5>> admit to [facility] 12/7>>more seizures-Keppra  bolus-Keppra  maintenance dosage increased to 750 mg BID 12/8>> more seizure overnight-LTM EEG-Depakote started, Keppra  dosage increased  Significant studies: 12/5>> CT head: Mild interval increase in right subdural hematoma 12/6>> MRI brain: Mild interval increase in size of subdural hematoma-18 mm.  Subarachnoid hemorrhage within the right cerebral cortex. 12/8-12/9>> LTM EEG: No seizures  Significant microbiology data: 12/6>> EEG: No seizures  Procedures: None  Consults: Neurosurgery Neurology  Subjective: Seen earlier this morning-lying comfortably in bed-no major issues overnight-appears unchanged-for SDH evacuation later today.  Objective: Vitals: Blood pressure 127/84, pulse 87, temperature 98 F (36.7 C), temperature source Oral, resp. rate 12, height 5' 7 (1.702 m), weight 54.9 kg, SpO2 100%.   Exam: Awake/alert Hardly any LUE weakness today-essentially nonfocal Abdomen: Soft nontender nondistended   Pertinent Labs/Radiology:    Latest Ref Rng & Units 10/05/2024    4:39 AM 10/02/2024    7:40 AM 09/30/2024   10:33 PM  CBC  WBC 4.0 - 10.5 K/uL 5.8  6.7  8.1   Hemoglobin 13.0 - 17.0 g/dL 88.5  89.2  89.1   Hematocrit 39.0 - 52.0 % 37.0  36.3  35.8    Platelets 150 - 400 K/uL 164  128  136     Lab Results  Component Value Date   NA 139 10/05/2024   K 3.4 (L) 10/05/2024   CL 106 10/05/2024   CO2 24 10/05/2024      Assessment/Plan: SDH Worsening radiologically-likely provoking seizures LUE weakness improved significantly-this could have been from Todd's paresis as well. Neurosurgery following-redo craniotomy scheduled for later today.  Seizure disorder Provoked-likely due to SDH On Keppra /Depakote per neurology LTM EEG overnight negative  Wellbutrin  discontinued on 12/7 as it can lower seizure threshold.  DM-2 with uncontrolled hyperglycemia (A1c 7.9 on 11/16)  Hypoglycemic this morning Decrease Lantus  to 10 units-stop Premeal NovoLog -continue SSI Follow/optimize  Recent Labs    10/05/24 0804 10/05/24 1005 10/05/24 1110  GLUCAP 130* 86 56*     Essential hypertension BP stable-continue Coreg /amlodipine    CKD stage IIIa Close to baseline Follow   History of heart transplant 2000 Chronic immunosuppressive therapy Continue CellCept  and Prograf  Per most recent echocardiogram at [facility]-normal LV function.   Hypokalemia Replete/recheck   Depression with anxiety Continue Viibryd  and Xanax  No longer on Wellbutrin -see above   Hypertriglyceridemia Atorvastatin  and fenofibrate    GERD Protonix   Normocytic anemia Chronic issue-likely due to underlying chronic disease/heart transplant/immunosuppressive's Follow    Code status:   Code Status: Full Code   DVT Prophylaxis: SCD's Start: 10/04/24 1201 SCDs Start: 10/01/24 0827   Family Communication: Sister [phone] updated 12/10   Disposition Plan: Status is: Inpatient Remains inpatient appropriate because: Severity of illness   Planned Discharge Destination:Home   Diet: Diet Order  Diet NPO time specified Except for: Sips with Meds  Diet effective midnight                     Antimicrobial agents: Anti-infectives  (From admission, onward)    Start     Dose/Rate Route Frequency Ordered Stop   10/05/24 0600  ceFAZolin  (ANCEF ) IVPB 2g/100 mL premix        2 g 200 mL/hr over 30 Minutes Intravenous On call to O.R. 10/04/24 1200 10/06/24 0559        MEDICATIONS: Scheduled Meds:  [MAR Hold] amLODipine   2.5 mg Oral Daily   [MAR Hold] atorvastatin   40 mg Oral Daily   [MAR Hold] carvedilol   12.5 mg Oral BID WC   [MAR Hold] dapagliflozin  propanediol  10 mg Oral QAC breakfast   [MAR Hold] insulin  aspart  0-15 Units Subcutaneous TID WC   [MAR Hold] insulin  aspart  0-5 Units Subcutaneous QHS   [MAR Hold] insulin  aspart  4 Units Subcutaneous TID WC   [MAR Hold] insulin  glargine  14 Units Subcutaneous Daily   [MAR Hold] levETIRAcetam   1,500 mg Oral BID   [MAR Hold] mycophenolate   250 mg Oral BID   [MAR Hold] pantoprazole   40 mg Oral BID   [MAR Hold] sodium chloride  flush  3 mL Intravenous Q12H   [MAR Hold] tacrolimus   0.5 mg Oral BID   [MAR Hold] Vilazodone  HCl  20 mg Oral Daily   Continuous Infusions:  sodium chloride  10 mL/hr at 10/05/24 1120    ceFAZolin  (ANCEF ) IV     [MAR Hold] valproate sodium  Stopped (10/04/24 2328)   PRN Meds:.0.9 % irrigation (POUR BTL), [MAR Hold] acetaminophen  **OR** [MAR Hold] acetaminophen , [MAR Hold] albuterol , [MAR Hold] ALPRAZolam , [MAR Hold] hydrALAZINE , [MAR Hold] ondansetron  **OR** [MAR Hold] ondansetron  (ZOFRAN ) IV, [MAR Hold] senna, Surgifoam 1 Gm with Thrombin  5,000 units (5 ml) topical solution, Surgifoam 100 with Thrombin  20,000 units (20 ml) topical solution   I have personally reviewed following labs and imaging studies  LABORATORY DATA: CBC: Recent Labs  Lab 09/30/24 2233 10/02/24 0740 10/05/24 0439  WBC 8.1 6.7 5.8  NEUTROABS 4.5  --   --   HGB 10.8* 10.7* 11.4*  HCT 35.8* 36.3* 37.0*  MCV 75.2* 75.2* 74.4*  PLT 136* 128* 164    Basic Metabolic Panel: Recent Labs  Lab 09/30/24 2233 10/02/24 0740 10/03/24 0212 10/04/24 0301 10/05/24 0439   NA 133* 135 137 138 139  K 3.1* 4.5 3.6 3.4* 3.4*  CL 99 104 101 104 106  CO2 23 19* 23 18* 24  GLUCOSE 258* 287* 155* 111* 120*  BUN 9 15 15 17 20   CREATININE 1.10 1.32* 1.49* 1.59* 1.48*  CALCIUM  8.7* 9.0 9.0 8.8* 8.8*  MG  --   --   --  1.8 1.8    GFR: Estimated Creatinine Clearance: 38.1 mL/min (A) (by C-G formula based on SCr of 1.48 mg/dL (H)).  Liver Function Tests: Recent Labs  Lab 09/30/24 2233  AST 18  ALT 11  ALKPHOS 53  BILITOT 0.6  PROT 6.7  ALBUMIN 3.6   No results for input(s): LIPASE, AMYLASE in the last 168 hours. No results for input(s): AMMONIA in the last 168 hours.  Coagulation Profile: Recent Labs  Lab 09/30/24 2233  INR 1.1    Cardiac Enzymes: No results for input(s): CKTOTAL, CKMB, CKMBINDEX, TROPONINI in the last 168 hours.  BNP (last 3 results) No results for input(s): PROBNP in the last 8760 hours.  Lipid Profile: No results for input(s): CHOL, HDL, LDLCALC, TRIG, CHOLHDL, LDLDIRECT in the last 72 hours.  Thyroid  Function Tests: No results for input(s): TSH, T4TOTAL, FREET4, T3FREE, THYROIDAB in the last 72 hours.  Anemia Panel: No results for input(s): VITAMINB12, FOLATE, FERRITIN, TIBC, IRON, RETICCTPCT in the last 72 hours.  Urine analysis:    Component Value Date/Time   COLORURINE STRAW (A) 01/30/2024 2245   APPEARANCEUR CLEAR 01/30/2024 2245   LABSPEC 1.020 01/30/2024 2245   PHURINE 6.0 01/30/2024 2245   GLUCOSEU >=500 (A) 01/30/2024 2245   HGBUR NEGATIVE 01/30/2024 2245   BILIRUBINUR NEGATIVE 01/30/2024 2245   KETONESUR NEGATIVE 01/30/2024 2245   PROTEINUR 30 (A) 01/30/2024 2245   NITRITE NEGATIVE 01/30/2024 2245   LEUKOCYTESUR NEGATIVE 01/30/2024 2245    Sepsis Labs: Lactic Acid, Venous No results found for: LATICACIDVEN  MICROBIOLOGY: No results found for this or any previous visit (from the past 240 hours).  RADIOLOGY STUDIES/RESULTS: Overnight EEG with  video Result Date: 10/04/2024 Shelton Arlin KIDD, MD     10/04/2024 12:58 PM Patient Name: AMDREW OBOYLE MRN: [id number] Epilepsy Attending: Arlin KIDD Shelton Referring Physician/Provider: Voncile Isles, MD Duration: 10/03/2024 9188 to 10/04/2024 9188 Patient history:  68 y.o. male with a history of altered mental status who is undergoing an EEG to evaluate for seizures. Level of alertness: Awake, asleep AEDs during EEG study: LEV, VPA Technical aspects: This EEG study was done with scalp electrodes positioned according to the 10-20 International system of electrode placement. Electrical activity was reviewed with band pass filter of 1-70Hz , sensitivity of 7 uV/mm, display speed of 32mm/sec with a 60Hz  notched filter applied as appropriate. EEG data were recorded continuously and digitally stored.  Video monitoring was available and reviewed as appropriate. Description: The posterior dominant rhythm consists of 8-9 Hz activity of moderate voltage (25-35 uV) seen predominantly in posterior head regions, symmetric and reactive to eye opening and eye closing. Sleep was characterized by vertex waves, sleep spindles (12 to 14 Hz), maximal frontocentral region. EEG showed continuous generalized 3 to 6 Hz theta-delta slowing in right fronto-temporal region. Hyperventilation and photic stimulation were not performed.   ABNORMALITY - Continuous slow, right fronto-temporal region IMPRESSION: This study is suggestive of cortical dysfunction arising from right fronto-temporal region, likely secondary to underlying structural abnormality. No seizures or epileptiform discharges were seen throughout the recording. Priyanka KIDD Shelton     LOS: 4 days   Donalda Applebaum, MD  [facility]    To contact the attending provider between 7A-7P or the covering provider during after hours 7P-7A, please log into the web site [email or web address] and access using universal [place] password for that web site. If you do not have the  password, please call the hospital operator.  10/05/2024, 12:02 PM

## 2024-10-06 ENCOUNTER — Encounter (HOSPITAL_COMMUNITY): Payer: Self-pay | Admitting: Neurological Surgery

## 2024-10-06 ENCOUNTER — Inpatient Hospital Stay (HOSPITAL_COMMUNITY)

## 2024-10-06 DIAGNOSIS — N179 Acute kidney failure, unspecified: Secondary | ICD-10-CM

## 2024-10-06 LAB — COMPREHENSIVE METABOLIC PANEL WITH GFR
ALT: 9 U/L (ref 0–44)
AST: 14 U/L — ABNORMAL LOW (ref 15–41)
Albumin: 3 g/dL — ABNORMAL LOW (ref 3.5–5.0)
Alkaline Phosphatase: 60 U/L (ref 38–126)
Anion gap: 12 (ref 5–15)
BUN: 23 mg/dL (ref 8–23)
CO2: 19 mmol/L — ABNORMAL LOW (ref 22–32)
Calcium: 8.2 mg/dL — ABNORMAL LOW (ref 8.9–10.3)
Chloride: 107 mmol/L (ref 98–111)
Creatinine, Ser: 1.37 mg/dL — ABNORMAL HIGH (ref 0.61–1.24)
GFR, Estimated: 57 mL/min — ABNORMAL LOW (ref >60)
Glucose, Bld: 166 mg/dL — ABNORMAL HIGH (ref 70–99)
Potassium: 4.2 mmol/L (ref 3.5–5.1)
Sodium: 138 mmol/L (ref 135–145)
Total Bilirubin: 0.8 mg/dL (ref 0.0–1.2)
Total Protein: 6.4 g/dL — ABNORMAL LOW (ref 6.5–8.1)

## 2024-10-06 LAB — CBC
HCT: 39.6 % (ref 39.0–52.0)
Hemoglobin: 11.7 g/dL — ABNORMAL LOW (ref 13.0–17.0)
MCH: 22.9 pg — ABNORMAL LOW (ref 26.0–34.0)
MCHC: 29.5 g/dL — ABNORMAL LOW (ref 30.0–36.0)
MCV: 77.3 fL — ABNORMAL LOW (ref 80.0–100.0)
Platelets: 131 K/uL — ABNORMAL LOW (ref 150–400)
RBC: 5.12 MIL/uL (ref 4.22–5.81)
RDW: 16.5 % — ABNORMAL HIGH (ref 11.5–15.5)
WBC: 4.4 K/uL (ref 4.0–10.5)
nRBC: 0 % (ref 0.0–0.2)

## 2024-10-06 LAB — GLUCOSE, CAPILLARY
Glucose-Capillary: 183 mg/dL — ABNORMAL HIGH (ref 70–99)
Glucose-Capillary: 191 mg/dL — ABNORMAL HIGH (ref 70–99)
Glucose-Capillary: 308 mg/dL — ABNORMAL HIGH (ref 70–99)
Glucose-Capillary: 285 mg/dL — ABNORMAL HIGH (ref 70–99)

## 2024-10-06 MED ORDER — INSULIN GLARGINE 100 UNIT/ML ~~LOC~~ SOLN
12.0000 [IU] | Freq: Every day | SUBCUTANEOUS | Status: DC
  Administered 2024-10-06 – 2024-10-08 (×3): 12 [IU] via SUBCUTANEOUS
  Filled 2024-10-06 (×3): qty 0.12

## 2024-10-06 NOTE — Progress Notes (Signed)
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
## 2024-10-06 NOTE — Progress Notes (Signed)
 Subjective: Patient reports doing well, no headaches, no acute events overnight   Objective: Vital signs in last 24 hours: Temp:  [97.8 F (36.6 C)-98.2 F (36.8 C)] 98 F (36.7 C) (12/11 0800) Pulse Rate:  [74-99] 87 (12/11 0700) Resp:  [9-24] 10 (12/11 0700) BP: (110-151)/(56-101) 126/69 (12/11 0700) SpO2:  [86 %-100 %] 100 % (12/11 0700)  Intake/Output from previous day: 12/10 0701 - 12/11 0700 In: 1794.9 [I.V.:1584.9; IV Piggyback:210] Out: 28 [Drains:18; Blood:10] Intake/Output this shift: No intake/output data recorded.  Neurologic: Grossly normal  Lab Results: Lab Results  Component Value Date   WBC 4.4 10/06/2024   HGB 11.7 (L) 10/06/2024   HCT 39.6 10/06/2024   MCV 77.3 (L) 10/06/2024   PLT 131 (L) 10/06/2024   Lab Results  Component Value Date   INR 1.1 09/30/2024   BMET Lab Results  Component Value Date   NA 138 10/06/2024   K 4.2 10/06/2024   CL 107 10/06/2024   CO2 19 (L) 10/06/2024   GLUCOSE 166 (H) 10/06/2024   BUN 23 10/06/2024   CREATININE 1.37 (H) 10/06/2024   CALCIUM  8.2 (L) 10/06/2024    Studies/Results: CT HEAD WO CONTRAST Result Date: 10/06/2024 EXAM: CT HEAD WITHOUT 10/06/2024 05:51:09 AM TECHNIQUE: CT of the head was performed without the administration of intravenous contrast. Automated exposure control, iterative reconstruction, and/or weight based adjustment of the mA/kV was utilized to reduce the radiation dose to as low as reasonably achievable. COMPARISON: Brain MRI and Head CT 10/01/2024 and earlier. CLINICAL HISTORY: 68 year old male postoperative day 1 status post repeat right craniotomy for evacuation of recurrent chronic subdural hematoma. FINDINGS: BRAIN AND VENTRICLES: Right side subdural drain in place. Residual mixed density right subdural space blood products and postoperative pneumocephalus. Right sided subdural hematoma has decreased. Residual now is 11 mm maximal thickness, versus 15 to 16 mm preoperatively. Mass effect on  the right hemisphere has decreased, only trace leftward midline shift now on coronal image 41. Improved patency of the right lateral ventricle. Basilar cisterns remain patent. Calcified atherosclerosis at the skull base. Chronic tortuosity of the basilar artery. No new intracranial hemorrhage identified. Stable gray white differentiation with chronic white matter disease and small chronic infarcts in the cerebellum. No suspicious intracranial vascular hyperdensity. No evidence of acute infarct. No hydrocephalus. ORBITS: Stable orbit soft tissues. SINUSES AND MASTOIDS: Paranasal sinuses, tympanic cavities and mastoids remain well aerated. SOFT TISSUES AND SKULL: New postoperative changes over the right superior scalp with skin staples and percutaneous drain in place. Intact skull elsewhere. No acute skull fracture. IMPRESSION: 1. Decreased mixed density right sided subdural hematoma on postoperative day 1 following craniotomy, with subdural drain in place. 2. Residual right SDH up to 11 mm. Leftward midline shift has decreased, now trace. 3. Chronic ischemic disease.  No new intracranial abnormality. Electronically signed by: Anthony Hurst MD 10/06/2024 06:36 AM EST RP Workstation: [id number]   Overnight EEG with video Result Date: 10/04/2024 Anthony Anthony KIDD, MD     10/04/2024 12:58 PM Patient Name: Anthony Price MRN: [id number] Epilepsy Attending: Arlin Price Anthony Referring Physician/Provider: Voncile Isles, MD Duration: 10/03/2024 9188 to 10/04/2024 9188 Patient history:  68 y.o. male with a history of altered mental status who is undergoing an EEG to evaluate for seizures. Level of alertness: Awake, asleep AEDs during EEG study: LEV, VPA Technical aspects: This EEG study was done with scalp electrodes positioned according to the 10-20 International system of electrode placement. Electrical activity was reviewed with band pass  filter of 1-70Hz , sensitivity of 7 uV/mm, display speed of 68mm/sec with a 60Hz   notched filter applied as appropriate. EEG data were recorded continuously and digitally stored.  Video monitoring was available and reviewed as appropriate. Description: The posterior dominant rhythm consists of 8-9 Hz activity of moderate voltage (25-35 uV) seen predominantly in posterior head regions, symmetric and reactive to eye opening and eye closing. Sleep was characterized by vertex waves, sleep spindles (12 to 14 Hz), maximal frontocentral region. EEG showed continuous generalized 3 to 6 Hz theta-delta slowing in right fronto-temporal region. Hyperventilation and photic stimulation were not performed.   ABNORMALITY - Continuous slow, right fronto-temporal region IMPRESSION: This study is suggestive of cortical dysfunction arising from right fronto-temporal region, likely secondary to underlying structural abnormality. No seizures or epileptiform discharges were seen throughout the recording. Anthony Price    Assessment/Plan: Postop day 1 crani for SDH reaccumulation. We will leave the drain in one more day. Would like for him to get out of bed today and walk the halls. Likely transfer to floor tomorrow.    LOS: 5 days    Anthony Price 10/06/2024, 8:10 AM

## 2024-10-06 NOTE — Evaluation (Signed)
 Physical Therapy Brief RE-Evaluation Patient Details Name: Anthony Price MRN: [id number] DOB: 13-Jun-1956 Today's Date: 10/06/2024  History of Present Illness  68 year old male who returns 09/30/24 after recent admission with R SDH 11/16 with right frontotemporoparietal craniotomy. 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 Had witnessed seizure in ED.  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 at [facility], pulmonary hypertension, HTN, CKD, HLD, NIDDM, depression/anxiety. 12/10 crani for SDH reaccumulation.  Clinical Impression  Pt was not agreeable to working with PT today, but needed to get into the bed, so used the opportunity to see how he was doing post redo crani.  P t well enough to continue goals as they are.     If plan is discharge home, recommend the following: A lot of help with walking and/or transfers;A lot of help with bathing/dressing/bathroom;Assistance with cooking/housework;Help with stairs or ramp for entrance   Can travel by private vehicle        Equipment Recommendations    Recommendations for Other Services       Functional Status Assessment Patient has had a recent decline in their functional status and demonstrates the ability to make significant improvements in function in a reasonable and predictable amount of time.     Precautions / Restrictions Precautions Precautions: Fall Recall of Precautions/Restrictions: Intact Precaution/Restrictions Comments: SBP < 160 DBP < 100      Mobility  Bed Mobility Overal bed mobility: Modified Independent             General bed mobility comments: no help to bring legs into bed and scoot up into an adequate position.    Transfers Overall transfer level: Needs assistance Equipment used: None Transfers: Sit to/from Stand, Bed to chair/wheelchair/BSC Sit to Stand: Contact guard assist            General transfer comment: min to CGA for balance on safety padding at EOB.  Pt turned to the L in a 360 degree turn to escape the lines and side stepped to position up high in the bed    Ambulation/Gait         Gait velocity: decreased     General Gait Details: pt deferred all mobility except that needed to get back to bed.  Stairs            Wheelchair Mobility     Tilt Bed    Modified Rankin (Stroke Patients Only)       Balance     Sitting balance-Leahy Scale: Good (to fair, prefers using UE's, too unsteady to sit at EOB without assist during donning/doffing brace.)   Postural control: Posterior lean Standing balance support: Reliant on assistive device for balance Standing balance-Leahy Scale: Good                               Pertinent Vitals/Pain Pain Assessment Pain Assessment: Faces Faces Pain Scale: Hurts a little bit Pain Location: head Pain Descriptors / Indicators: Headache Pain Intervention(s): Monitored during session    Home Living     Available Help at Discharge: Family;Available 24 hours/day Type of Home: Mobile home                  Prior Function  Extremity/Trunk Assessment                Communication   Communication Factors Affecting Communication: Difficulty expressing self    Cognition Arousal: Alert, Lethargic Behavior During Therapy: WFL for tasks assessed/performed   PT - Cognitive impairments: No family/caregiver present to determine baseline Difficult to assess due to: Impaired communication (slow to give answers.)                       Following commands: Intact Following commands impaired: Only follows one step commands consistently, Follows one step commands with increased time     Cueing Cueing Techniques: Verbal cues     General Comments General comments (skin integrity, edema, etc.): Pt fatigued on entry and deferred any mobility other  than getting back to bed.    Exercises     Assessment/Plan    PT Assessment    PT Problem List         PT Treatment Interventions Gait training;Stair training;Functional mobility training;Therapeutic activities;Balance training;Patient/family education;DME instruction;Therapeutic exercise;Neuromuscular re-education;Cognitive remediation    PT Goals (Current goals can be found in the Care Plan section)  Acute Rehab PT Goals Patient Stated Goal: didn't participate PT Goal Formulation: With patient Time For Goal Achievement: 10/17/24 Potential to Achieve Goals: Good    Frequency Min 2X/week     Co-evaluation               AM-PAC PT 6 Clicks Mobility  Outcome Measure Help needed turning from your back to your side while in a flat bed without using bedrails?: None Help needed moving from lying on your back to sitting on the side of a flat bed without using bedrails?: Total Help needed moving to and from a bed to a chair (including a wheelchair)?: A Lot       6 Click Score: 7    End of Session     Patient left: in bed;with call bell/phone within reach;with bed alarm set Nurse Communication: Mobility status PT Visit Diagnosis: Difficulty in walking, not elsewhere classified (R26.2);Pain;Unsteadiness on feet (R26.81)    Time: 8559-8550 PT Time Calculation (min) (ACUTE ONLY): 9 min   Charges:     PT Treatments $Therapeutic Activity: 8-22 mins PT General Charges $$ ACUTE PT VISIT: 1 Visit       10/06/2024  India HERO., PT Acute Rehabilitation Services [phone]  (office)  10/06/2024  India HERO., PT Acute Rehabilitation Services [phone]  (office)  Vinie GAILS Micheale Schlack 10/06/2024, 4:02 PM
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
## 2024-10-06 NOTE — Progress Notes (Signed)
 Occupational Therapy Treatment Patient Details Name: Anthony Price MRN: [id number] DOB: [birth year] Today's Date: 10/06/2024   History of present illness 68 year old male who returns 09/30/24 after recent admission with R SDH 11/16 with right frontotemporoparietal craniotomy. 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 Had witnessed seizure in ED.  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 12/10 crani for SDH reaccumulation.   OT comments  Patient with nice improvement in functional status compared to original evaluation.  Patient is now s/p evacuation of SDH.  Coordination appears improved, patient moving better with less hands on with improved balance.  Patient is following commands better and his speech is more intelligible. OT will follow in the acute setting to address deficits, and AIR will continue to be recommended for now.        If plan is discharge home, recommend the following:  A lot of help with bathing/dressing/bathroom;Two people to help with walking and/or transfers;Direct supervision/assist for medications management;Assist for transportation;Assistance with cooking/housework   Equipment Recommendations       Recommendations for Other Services      Precautions / Restrictions Precautions Precautions: Fall Recall of Precautions/Restrictions: Intact Precaution/Restrictions Comments: SBP < 160 DBP < 100 Restrictions Weight Bearing Restrictions Per Provider Order: No       Mobility Bed Mobility Overal bed mobility: Modified Independent                  Transfers Overall transfer level: Needs assistance Equipment used: Rolling walker (2 wheels) Transfers: Sit to/from Stand, Bed to chair/wheelchair/BSC Sit to Stand: Contact guard assist     Step pivot transfers:  Contact guard assist, Min assist           Balance Overall balance assessment: Needs assistance, History of Falls Sitting-balance support: Feet supported Sitting balance-Leahy Scale: Good     Standing balance support: Reliant on assistive device for balance Standing balance-Leahy Scale: Fair                             ADL either performed or assessed with clinical judgement   ADL Overall ADL's : Needs assistance/impaired Eating/Feeding: Set up;Sitting   Grooming: Wash/dry hands;Wash/dry face;Sitting;Set up   Upper Body Bathing: Set up;Sitting   Lower Body Bathing: Minimal assistance;Sit to/from stand   Upper Body Dressing : Minimal assistance;Sitting   Lower Body Dressing: Minimal assistance;Sit to/from stand   Toilet Transfer: Minimal assistance;Rolling walker (2 wheels);Regular Toilet;Ambulation                  Extremity/Trunk Assessment Upper Extremity Assessment Upper Extremity Assessment: Right hand dominant LUE Deficits / Details: Improving L grip and strength.  Able to feed himself this am with both hands.   Lower Extremity Assessment Lower Extremity Assessment: Defer to PT evaluation   Cervical / Trunk Assessment Cervical / Trunk Assessment: Normal    Vision Baseline Vision/History: 1 Wears glasses Patient Visual Report: No change from baseline     Perception Perception Perception: Within Functional Limits   Praxis Praxis Praxis: [facility]   Communication Communication Communication: No apparent difficulties;Impaired Factors Affecting Communication: Difficulty expressing self   Cognition Arousal: Alert Behavior During Therapy: WFL for tasks assessed/performed Cognition: No apparent impairments             OT - Cognition Comments: Following  commands better, able to focus well and no safety cues noted                 Following commands: Intact Following commands impaired: Only follows one step commands consistently       Cueing   Cueing Techniques: Verbal cues  Exercises      Shoulder Instructions       General Comments      Pertinent Vitals/ Pain       Pain Assessment Pain Assessment: No/denies pain Pain Intervention(s): Monitored during session                                                          Frequency  Min 2X/week        Progress Toward Goals  OT Goals(current goals can now be found in the care plan section)  Progress towards OT goals: Progressing toward goals  Acute Rehab OT Goals OT Goal Formulation: With patient Time For Goal Achievement: 10/17/24 Potential to Achieve Goals: Good  Plan      Co-evaluation                 AM-PAC OT 6 Clicks Daily Activity     Outcome Measure   Help from another person eating meals?: A Little Help from another person taking care of personal grooming?: A Little Help from another person toileting, which includes using toliet, bedpan, or urinal?: A Little Help from another person bathing (including washing, rinsing, drying)?: A Little Help from another person to put on and taking off regular upper body clothing?: A Little Help from another person to put on and taking off regular lower body clothing?: A Little 6 Click Score: 18    End of Session Equipment Utilized During Treatment: Rolling walker (2 wheels)  OT Visit Diagnosis: Unsteadiness on feet (R26.81);Muscle weakness (generalized) (M62.81);Ataxia, unspecified (R27.0)   Activity Tolerance Patient tolerated treatment well   Patient Left in chair;with call bell/phone within reach;with chair alarm set   Nurse Communication          Time: [phone] OT Time Calculation (min): 23 min  Charges: OT General Charges $OT Visit: 1 Visit OT Treatments $Self Care/Home Management : 8-22 mins $Therapeutic Activity: 8-22 mins  10/06/2024  RP, OTR/L  Acute Rehabilitation Services  Office:  [phone]   [name] 10/06/2024,  8:55 AM

## 2024-10-07 LAB — GLUCOSE, CAPILLARY
Glucose-Capillary: 219 mg/dL — ABNORMAL HIGH (ref 70–99)
Glucose-Capillary: 214 mg/dL — ABNORMAL HIGH (ref 70–99)
Glucose-Capillary: 196 mg/dL — ABNORMAL HIGH (ref 70–99)
Glucose-Capillary: 155 mg/dL — ABNORMAL HIGH (ref 70–99)

## 2024-10-07 LAB — BASIC METABOLIC PANEL WITH GFR
Anion gap: 8 (ref 5–15)
BUN: 28 mg/dL — ABNORMAL HIGH (ref 8–23)
CO2: 22 mmol/L (ref 22–32)
Calcium: 8.2 mg/dL — ABNORMAL LOW (ref 8.9–10.3)
Chloride: 108 mmol/L (ref 98–111)
Creatinine, Ser: 1.33 mg/dL — ABNORMAL HIGH (ref 0.61–1.24)
GFR, Estimated: 59 mL/min — ABNORMAL LOW (ref >60)
Glucose, Bld: 214 mg/dL — ABNORMAL HIGH (ref 70–99)
Potassium: 4.4 mmol/L (ref 3.5–5.1)
Sodium: 138 mmol/L (ref 135–145)

## 2024-10-07 LAB — PHOSPHORUS: Phosphorus: 2.9 mg/dL (ref 2.5–4.6)

## 2024-10-07 LAB — CBC
HCT: 34.2 % — ABNORMAL LOW (ref 39.0–52.0)
Hemoglobin: 10.5 g/dL — ABNORMAL LOW (ref 13.0–17.0)
MCH: 23.1 pg — ABNORMAL LOW (ref 26.0–34.0)
MCHC: 30.7 g/dL (ref 30.0–36.0)
MCV: 75.3 fL — ABNORMAL LOW (ref 80.0–100.0)
Platelets: 158 K/uL (ref 150–400)
RBC: 4.54 MIL/uL (ref 4.22–5.81)
RDW: 16.5 % — ABNORMAL HIGH (ref 11.5–15.5)
WBC: 7.1 K/uL (ref 4.0–10.5)
nRBC: 0 % (ref 0.0–0.2)

## 2024-10-07 LAB — MAGNESIUM: Magnesium: 1.8 mg/dL (ref 1.7–2.4)

## 2024-10-07 MED ORDER — DIVALPROEX SODIUM 500 MG PO DR TAB
500.0000 mg | DELAYED_RELEASE_TABLET | Freq: Two times a day (BID) | ORAL | Status: DC
  Administered 2024-10-07 – 2024-10-08 (×2): 500 mg via ORAL
  Filled 2024-10-07 (×3): qty 1

## 2024-10-07 MED ORDER — SODIUM CHLORIDE 0.9 % IV SOLN: INTRAVENOUS | Status: DC

## 2024-10-07 NOTE — Progress Notes (Signed)
 Inpatient Rehab Admissions:  Inpatient Rehab Consult received.  I met with patient at the bedside for rehabilitation assessment and to discuss goals and expectations of an inpatient rehab admission.  Dicussed average length of stay, insurance authorization requirement and discharge home after completion of CIR. Pt acknowledged understanding. Pt does not want to stay in the hospital any longer than he has to and would prefer to receive therapy closer to home. TOC made aware.  Signed: Tinnie Yvone Cohens, MS, CCC-SLP Admissions Coordinator [phone]

## 2024-10-07 NOTE — Progress Notes (Signed)
 PROGRESS NOTE    Anthony Price  FMW:[id number] DOB: October 20, 1956 DOA: 09/30/2024 PCP: Dettinger, Fonda LABOR, MD   Brief Narrative:  This 68 yrs old Male with PMH significant for CHF s/p heart transplant(2000) on immunosuppressants, posttransplant lymphoproliferative disorder, recurrent right SDH causing seizure activity presented for  scheduled craniotomy with Dr. Joshua.  Patient was recently hospitalized from 11/16 to 11/19 for SDH requiring craniotomy with evacuation with residual left upper extremity weakness.  PCCM was consulted for medical management postoperatively.  Significant Hospital events: 12/5 admit to [facility] 12/7>>More seizures-Keppra  bolus-Keppra  maintenance dosage increased to 750 mg BID 12/8>> more seizure overnight-LTM EEG-Depakote started, Keppra  dosage increased 12/10 Admit to ICU s/p craniotomy for recurrent R SDH CT head 12/11 > decreased density of R SDH following craniotomy, MLS decreased. 12/12 : TRH pickup. Patient still has Drain   Assessment & Plan:   Principal Problem:   Subdural hematoma (HCC) Active Problems:   S/P craniotomy   Seizure (HCC)   Uncontrolled type 2 diabetes mellitus with hyperglycemia, with long-term current use of insulin  (HCC)   Hypertension associated with diabetes (HCC)   History of heart transplant (HCC)   Hypokalemia   Depression with anxiety   Hypertriglyceridemia   GERD (gastroesophageal reflux disease)  Recurrent Right SDH s/p craniotomy 12/10 by Dr. Joshua: Seizures secondary to above: Patient presented with recurrent subdural hematoma. Status postcraniotomy with evacuation 12/10. POD # 2 Maintain EVD as per neurosurgery. Continue adequate pain control. Neurosurgery is following.  Continue steroid taper. Continue Keppra  1500 mg twice daily, valproic  acid 500 mg q12hr. Continue seizure precautions. PT and OT evaluation.  CHF s/p heart transplant on immunosuppressants: Continue immune suppressant / antirejection meds  CellCept  and Prograf .  Essential hypertension: Continue Coreg , Norvasc , Farxiga  Continue hydralazine  10 mg Q4 as needed. SBP goal below 150.  Stress-induced hyperglycemia Type 2 diabetes uncontrolled Continue regular insulin  sliding scale Continue basal insulin  12 units daily  Carb modified diet  AKI on CKD stage IIIb Continue IV hydration.   Avoid nephrotoxic medication.   DVT prophylaxis: SCDs Code Status: Full code Family Communication: No family at bed side. Disposition Plan:    Status is: Inpatient Remains inpatient appropriate because: Severity of illness.  Plan likely  acute inpatient rehab once clear from Neurosurgery    Consultants:  Neuro Surgery PCCM  Procedures: Status post craniotomy. Antimicrobials:  Anti-infectives (From admission, onward)    Start     Dose/Rate Route Frequency Ordered Stop   10/05/24 0600  ceFAZolin  (ANCEF ) IVPB 2g/100 mL premix        2 g 200 mL/hr over 30 Minutes Intravenous On call to O.R. 10/04/24 1200 10/05/24 1203       Subjective: Patient was seen and examined at bedside.  Overnight events noted. Patient was sitting comfortably on the bed,  has a drain connected on his right side of his head. Patient reports pain is reasonably controlled.  Status post craniectomy with evacuation.  POD #2.  Objective: Vitals:   10/07/24 0600 10/07/24 0700 10/07/24 0750 10/07/24 0800  BP: 139/71 135/72  (!) 142/87  Pulse: 80 77  76  Resp: 15 11  16   Temp:   98.3 F (36.8 C)   TempSrc:   Oral   SpO2: 96% 97%  98%  Weight:      Height:        Intake/Output Summary (Last 24 hours) at 10/07/2024 1110 Last data filed at 10/07/2024 0753 Gross per 24 hour  Intake 1602.24 ml  Output 2305  ml  Net -702.76 ml   Filed Weights   09/30/24 2217  Weight: 54.9 kg    Examination:  General exam: Appears calm and comfortable , not in any acute distress. Respiratory system: CTA Bilaterally. Respiratory effort normal.  RR 16 Cardiovascular  system: S1 & S2 heard, RRR. No JVD, murmurs, rubs, gallops or clicks.  Gastrointestinal system: Abdomen is non distended, soft and non tender. Normal bowel sounds heard. Central nervous system: Alert and oriented x 3. No focal neurological deficits. Extremities: No edema, no cyanosis, no clubbing. Skin: No rashes, lesions or ulcers Psychiatry: Judgement and insight appear normal. Mood & affect appropriate.     Data Reviewed: I have personally reviewed following labs and imaging studies  CBC: Recent Labs  Lab 09/30/24 2233 10/02/24 0740 10/05/24 0439 10/06/24 0150 10/07/24 0354  WBC 8.1 6.7 5.8 4.4 7.1  NEUTROABS 4.5  --   --   --   --   HGB 10.8* 10.7* 11.4* 11.7* 10.5*  HCT 35.8* 36.3* 37.0* 39.6 34.2*  MCV 75.2* 75.2* 74.4* 77.3* 75.3*  PLT 136* 128* 164 131* 158   Basic Metabolic Panel: Recent Labs  Lab 10/03/24 0212 10/04/24 0301 10/05/24 0439 10/06/24 0150 10/07/24 0354  NA 137 138 139 138 138  K 3.6 3.4* 3.4* 4.2 4.4  CL 101 104 106 107 108  CO2 23 18* 24 19* 22  GLUCOSE 155* 111* 120* 166* 214*  BUN 15 17 20 23  28*  CREATININE 1.49* 1.59* 1.48* 1.37* 1.33*  CALCIUM  9.0 8.8* 8.8* 8.2* 8.2*  MG  --  1.8 1.8  --  1.8  PHOS  --   --   --   --  2.9   GFR: Estimated Creatinine Clearance: 42.4 mL/min (A) (by C-G formula based on SCr of 1.33 mg/dL (H)). Liver Function Tests: Recent Labs  Lab 09/30/24 2233 10/06/24 0150  AST 18 14*  ALT 11 9  ALKPHOS 53 60  BILITOT 0.6 0.8  PROT 6.7 6.4*  ALBUMIN 3.6 3.0*   No results for input(s): LIPASE, AMYLASE in the last 168 hours. No results for input(s): AMMONIA in the last 168 hours. Coagulation Profile: Recent Labs  Lab 09/30/24 2233  INR 1.1   Cardiac Enzymes: No results for input(s): CKTOTAL, CKMB, CKMBINDEX, TROPONINI in the last 168 hours. BNP (last 3 results) No results for input(s): PROBNP in the last 8760 hours. HbA1C: No results for input(s): HGBA1C in the last 72  hours. CBG: Recent Labs  Lab 10/06/24 0759 10/06/24 1110 10/06/24 1554 10/06/24 2124 10/07/24 0751  GLUCAP 183* 191* 308* 285* 219*   Lipid Profile: No results for input(s): CHOL, HDL, LDLCALC, TRIG, CHOLHDL, LDLDIRECT in the last 72 hours. Thyroid  Function Tests: No results for input(s): TSH, T4TOTAL, FREET4, T3FREE, THYROIDAB in the last 72 hours. Anemia Panel: No results for input(s): VITAMINB12, FOLATE, FERRITIN, TIBC, IRON, RETICCTPCT in the last 72 hours. Sepsis Labs: No results for input(s): PROCALCITON, LATICACIDVEN in the last 168 hours.  No results found for this or any previous visit (from the past 240 hours).   Radiology Studies: CT HEAD WO CONTRAST Result Date: 10/06/2024 EXAM: CT HEAD WITHOUT 10/06/2024 05:51:09 AM TECHNIQUE: CT of the head was performed without the administration of intravenous contrast. Automated exposure control, iterative reconstruction, and/or weight based adjustment of the mA/kV was utilized to reduce the radiation dose to as low as reasonably achievable. COMPARISON: Brain MRI and Head CT 10/01/2024 and earlier. CLINICAL HISTORY: 68 year old male postoperative day 1 status  post repeat right craniotomy for evacuation of recurrent chronic subdural hematoma. FINDINGS: BRAIN AND VENTRICLES: Right side subdural drain in place. Residual mixed density right subdural space blood products and postoperative pneumocephalus. Right sided subdural hematoma has decreased. Residual now is 11 mm maximal thickness, versus 15 to 16 mm preoperatively. Mass effect on the right hemisphere has decreased, only trace leftward midline shift now on coronal image 41. Improved patency of the right lateral ventricle. Basilar cisterns remain patent. Calcified atherosclerosis at the skull base. Chronic tortuosity of the basilar artery. No new intracranial hemorrhage identified. Stable gray white differentiation with chronic white matter disease and  small chronic infarcts in the cerebellum. No suspicious intracranial vascular hyperdensity. No evidence of acute infarct. No hydrocephalus. ORBITS: Stable orbit soft tissues. SINUSES AND MASTOIDS: Paranasal sinuses, tympanic cavities and mastoids remain well aerated. SOFT TISSUES AND SKULL: New postoperative changes over the right superior scalp with skin staples and percutaneous drain in place. Intact skull elsewhere. No acute skull fracture. IMPRESSION: 1. Decreased mixed density right sided subdural hematoma on postoperative day 1 following craniotomy, with subdural drain in place. 2. Residual right SDH up to 11 mm. Leftward midline shift has decreased, now trace. 3. Chronic ischemic disease.  No new intracranial abnormality. Electronically signed by: Helayne Hurst MD 10/06/2024 06:36 AM EST RP Workstation: [id number]   Scheduled Meds:  amLODipine   2.5 mg Oral Daily   carvedilol   12.5 mg Oral BID WC   Chlorhexidine  Gluconate Cloth  6 each Topical Daily   dapagliflozin  propanediol  10 mg Oral QAC breakfast   dexamethasone  (DECADRON ) injection  4 mg Intravenous Q6H   Followed by   dexamethasone  (DECADRON ) injection  4 mg Intravenous Q8H   divalproex  500 mg Oral Q12H   insulin  aspart  0-15 Units Subcutaneous TID WC   insulin  aspart  0-5 Units Subcutaneous QHS   insulin  glargine  12 Units Subcutaneous Daily   levETIRAcetam   1,500 mg Oral BID   mycophenolate   250 mg Oral BID   pantoprazole   40 mg Oral BID   tacrolimus   0.5 mg Oral BID   Vilazodone  HCl  20 mg Oral Daily   Continuous Infusions:  0.9 % NaCl with KCl 20 mEq / L 75 mL/hr at 10/07/24 0600   valproate sodium  500 mg (10/07/24 1018)     LOS: 6 days    Time spent: 50 Mins    Darcel Dawley, MD [facility]   If 7PM-7AM, please contact night-coverage

## 2024-10-07 NOTE — Progress Notes (Signed)
 Patient ID: Anthony Price, male   DOB: 1956-10-27, 68 y.o.   MRN: [id number] Doing well, no headache or NT, hand still a little weak, incision is good, drain removed, stable for d/c from our standpoint with f/u in 2 weeks

## 2024-10-08 ENCOUNTER — Other Ambulatory Visit (HOSPITAL_COMMUNITY): Payer: Self-pay

## 2024-10-08 ENCOUNTER — Other Ambulatory Visit: Payer: Self-pay | Admitting: Family Medicine

## 2024-10-08 DIAGNOSIS — S065XAA Traumatic subdural hemorrhage with loss of consciousness status unknown, initial encounter: Secondary | ICD-10-CM | POA: Diagnosis not present

## 2024-10-08 LAB — GLUCOSE, CAPILLARY
Glucose-Capillary: 167 mg/dL — ABNORMAL HIGH (ref 70–99)
Glucose-Capillary: 173 mg/dL — ABNORMAL HIGH (ref 70–99)

## 2024-10-08 MED ORDER — DIVALPROEX SODIUM 500 MG PO DR TAB
500.0000 mg | DELAYED_RELEASE_TABLET | Freq: Two times a day (BID) | ORAL | 0 refills | Status: DC
  Filled 2024-10-08: qty 14, 7d supply, fill #0

## 2024-10-08 MED ORDER — HYDROCODONE-ACETAMINOPHEN 5-325 MG PO TABS
1.0000 | ORAL_TABLET | ORAL | 0 refills | Status: AC | PRN
  Filled 2024-10-08: qty 10, 2d supply, fill #0

## 2024-10-08 MED ORDER — LEVETIRACETAM 750 MG PO TABS
1500.0000 mg | ORAL_TABLET | Freq: Two times a day (BID) | ORAL | 0 refills | Status: DC
  Filled 2024-10-08: qty 28, 7d supply, fill #0

## 2024-10-08 MED ORDER — LEVETIRACETAM 750 MG PO TABS
1500.0000 mg | ORAL_TABLET | Freq: Two times a day (BID) | ORAL | 0 refills | Status: DC
  Filled 2024-10-08: qty 120, 30d supply, fill #0

## 2024-10-08 MED ORDER — DIVALPROEX SODIUM 500 MG PO DR TAB
500.0000 mg | DELAYED_RELEASE_TABLET | Freq: Two times a day (BID) | ORAL | 0 refills | Status: DC
  Filled 2024-10-08: qty 60, 30d supply, fill #0

## 2024-10-08 MED ORDER — LEVETIRACETAM 750 MG PO TABS: 1500.0000 mg | ORAL_TABLET | Freq: Two times a day (BID) | ORAL | 0 refills | Status: AC

## 2024-10-08 MED ORDER — DIVALPROEX SODIUM 500 MG PO DR TAB: 500.0000 mg | DELAYED_RELEASE_TABLET | Freq: Two times a day (BID) | ORAL | 0 refills | Status: AC

## 2024-10-08 NOTE — Discharge Summary (Addendum)
 Physician Discharge Summary  [name] FMW:[id number] DOB: Jun 22, 1956 DOA: 09/30/2024  PCP: Dettinger, Fonda LABOR, MD  Admit date: 09/30/2024  Discharge date: 10/08/2024  Admitted From: Home  Disposition:  Home Health Services  Recommendations for Outpatient Follow-up:  Follow up with PCP in 1-2 weeks. Please obtain BMP/CBC in one week. Advised to follow-up with Neurosurgery in 2 week. Advised to continue Keppra  and Depakote  Advised to follow-up with neurology in 2 weeks.  Home Health:Home PT/OT Equipment/Devices:None  Discharge Condition: Stable CODE STATUS:Full code Diet recommendation: Carb modified diet.  Brief Summary/ Hospital Course: This 68 yrs old Male with PMH significant for CHF s/p heart transplant(2000) on immunosuppressants, posttransplant lymphoproliferative disorder, recurrent right SDH causing seizure activity presented for scheduled craniotomy with Dr. Joshua.  Patient was recently hospitalized from 11/16 to 11/19 for SDH requiring craniotomy with evacuation with residual left upper extremity weakness.  PCCM was consulted for medical management postoperatively.  Patient was managed in progressive, had made significant improvement.  EVD removed.  PT and OT recommended acute inpatient rehab but patient wants to go home with home health services.  Neurosurgery has cleared the patient for discharge home.  Discussed with Dr. Cabbell about discharge medications.  He states no dexamethasone .  Discussed with Dr. Voncile Neurologist, given recurrent seizures he wanted to continue Depakote  and Keppra  and follow-up with Neurology. Prescription sent to the patient's desired pharmacy.  Patient was notified.  Significant Hospital events: 12/5 admit to [facility] 12/7>>More seizures-Keppra  bolus-Keppra  maintenance dosage increased to 750 mg BID 12/8>> more seizure overnight-LTM EEG-Depakote  started, Keppra  dosage increased 12/10 Admit to ICU s/p craniotomy for recurrent R SDH CT head  12/11 > decreased density of R SDH following craniotomy, MLS decreased. 12/12 : [facility] pickup. Patient still has Drain  Discharge Diagnoses:  Principal Problem:   Subdural hematoma (HCC) Active Problems:   S/P craniotomy   Seizure (HCC)   Uncontrolled type 2 diabetes mellitus with hyperglycemia, with long-term current use of insulin  (HCC)   Hypertension associated with diabetes (HCC)   History of heart transplant (HCC)   Hypokalemia   Depression with anxiety   Hypertriglyceridemia   GERD (gastroesophageal reflux disease)  Recurrent Right SDH s/p craniotomy 12/10 by Dr. Joshua: Seizures secondary to above: Patient presented with recurrent subdural hematoma. Status postcraniotomy with evacuation 12/10. POD # 3 EVD removed as per neurosurgery. Continue adequate pain control. Neurosurgery is following.  Continue steroid taper. Continue Keppra  1500 mg twice daily, valproic  acid 500 mg q12hr Continue seizure precautions. PT and OT evaluation.   CHF s/p heart transplant on immunosuppressants: Continue immune suppressant / antirejection meds CellCept  and Prograf .   Essential hypertension: Continue Coreg , Norvasc , Farxiga  Continue hydralazine  10 mg Q4 as needed. SBP goal below 150.   Stress-induced hyperglycemia Type 2 diabetes uncontrolled Continue regular insulin  sliding scale Continue basal insulin  12 units daily  Carb modified diet   AKI on CKD stage IIIb Continue IV hydration.   Avoid nephrotoxic medication.    Discharge Instructions  Discharge Instructions     Call MD for:  difficulty breathing, headache or visual disturbances   Complete by: As directed    Call MD for:  persistant nausea and vomiting   Complete by: As directed    Diet general   Complete by: As directed    Discharge instructions   Complete by: As directed    Advised to follow-up with primary care physician in 1 week. Advised to follow-up with neurosurgery in 1 week. Advised to continue Keppra  and  Depakote  as prescribed.   Discharge wound care:   Complete by: As directed    Follow up Neurosurgery   Increase activity slowly   Complete by: As directed       Allergies as of 10/08/2024       Reactions   Lactose Intolerance (gi) Diarrhea, Other (See Comments)   Bloating    Ultram [tramadol] Nausea Only        Medication List     STOP taking these medications    aspirin EC 81 MG tablet   dexamethasone  2 MG tablet Commonly known as: DECADRON    levETIRAcetam  500 MG tablet Commonly known as: KEPPRA    sildenafil  20 MG tablet Commonly known as: REVATIO        TAKE these medications    acetaminophen  500 MG tablet Commonly known as: TYLENOL  Take 1,000 mg by mouth 2 (two) times daily as needed for headache or fever (pain).   ALPRAZolam  1 MG tablet Commonly known as: XANAX  TAKE (1) TABLET BY MOUTH THREE TIMES DAILY.   amLODipine  2.5 MG tablet Commonly known as: NORVASC  Take 1 tablet (2.5 mg total) by mouth daily.   atorvastatin  40 MG tablet Commonly known as: LIPITOR Take 1 tablet (40 mg total) by mouth daily. What changed: when to take this   buPROPion  300 MG 24 hr tablet Commonly known as: WELLBUTRIN  XL TAKE 1 TABLET BY MOUTH ONCE EVERY MORNING.   carvedilol  12.5 MG tablet Commonly known as: COREG  TAKE (1) TABLET BY MOUTH TWICE DAILY.   dapagliflozin  propanediol 10 MG Tabs tablet Commonly known as: Farxiga  Take 1 tablet (10 mg total) by mouth daily before breakfast.   esomeprazole  20 MG capsule Commonly known as: NEXIUM  TAKE (1) CAPSULE BY MOUTH TWICE DAILYBEFORE A MEAL What changed:  how much to take how to take this when to take this additional instructions   fenofibrate  48 MG tablet Commonly known as: TRICOR  Take 1 tablet (48 mg total) by mouth daily.   HYDROcodone -acetaminophen  5-325 MG tablet Commonly known as: NORCO/VICODIN Take 1 tablet by mouth every 4 (four) hours as needed for up to 3 days for moderate pain (pain score 4-6).    Januvia  50 MG tablet Generic drug: sitaGLIPtin  TAKE ONE TABLET BY MOUTH EVERY DAY   MAGNESIUM  PO Take 1 tablet by mouth every evening.   metFORMIN  500 MG tablet Commonly known as: GLUCOPHAGE  Take 1 tablet (500 mg total) by mouth 2 (two) times daily with a meal.   mycophenolate  250 MG capsule Commonly known as: CELLCEPT  Take 250 mg by mouth 2 (two) times daily.   OMEGA-3 PO Take 1-2 capsules by mouth See admin instructions. Take 1 capsule by mouth in the morning and take 2 capsules at night.   tacrolimus  0.5 MG capsule Commonly known as: PROGRAF  Take 0.5 mg by mouth in the morning and at bedtime.   Tresiba  FlexTouch 100 UNIT/ML FlexTouch Pen Generic drug: insulin  degludec Inject 9 Units into the skin daily. Inject 9 Units into the skin daily. What changed:  when to take this additional instructions   Vilazodone  HCl 20 MG Tabs Commonly known as: Viibryd  Take 1 tablet (20 mg total) by mouth daily.               Discharge Care Instructions  (From admission, onward)           Start     Ordered   10/08/24 0000  Discharge wound care:       Comments: Follow up Neurosurgery   10/08/24  1127            Follow-up Information     Joshua Alm Hamilton, MD. Schedule an appointment as soon as possible for a visit in 2 week(s).   Specialty: Neurosurgery Contact information: [address] [place] [address] [phone]         Dettinger, Fonda LABOR, MD Follow up in 1 week(s).   Specialties: Family Medicine, Cardiology Contact information: [address] [place] [address] [phone]         [facility] Follow up.   Specialty: Rehabilitation Why: Physical therapy in outpatient setting. Office will call to arrange follow up after hospital discharge. Contact information: [address] [place]  [id number] [phone]                Allergies[1]  Consultations: Neurosurgery PCCM   Procedures/Studies: CT HEAD WO CONTRAST Result Date: 10/06/2024 EXAM: CT HEAD WITHOUT 10/06/2024 05:51:09 AM TECHNIQUE: CT of the head was performed without the administration of intravenous contrast. Automated exposure control, iterative reconstruction, and/or weight based adjustment of the mA/kV was utilized to reduce the radiation dose to as low as reasonably achievable. COMPARISON: Brain MRI and Head CT 10/01/2024 and earlier. CLINICAL HISTORY: 68 year old male postoperative day 1 status post repeat right craniotomy for evacuation of recurrent chronic subdural hematoma. FINDINGS: BRAIN AND VENTRICLES: Right side subdural drain in place. Residual mixed density right subdural space blood products and postoperative pneumocephalus. Right sided subdural hematoma has decreased. Residual now is 11 mm maximal thickness, versus 15 to 16 mm preoperatively. Mass effect on the right hemisphere has decreased, only trace leftward midline shift now on coronal image 41. Improved patency of the right lateral ventricle. Basilar cisterns remain patent. Calcified atherosclerosis at the skull base. Chronic tortuosity of the basilar artery. No new intracranial hemorrhage identified. Stable gray white differentiation with chronic white matter disease and small chronic infarcts in the cerebellum. No suspicious intracranial vascular hyperdensity. No evidence of acute infarct. No hydrocephalus. ORBITS: Stable orbit soft tissues. SINUSES AND MASTOIDS: Paranasal sinuses, tympanic cavities and mastoids remain well aerated. SOFT TISSUES AND SKULL: New postoperative changes over the right superior scalp with skin staples and percutaneous drain in place. Intact skull elsewhere. No acute skull fracture. IMPRESSION: 1. Decreased mixed density right sided subdural hematoma on postoperative day 1 following craniotomy, with subdural drain in place. 2. Residual right SDH up to 11 mm.  Leftward midline shift has decreased, now trace. 3. Chronic ischemic disease.  No new intracranial abnormality. Electronically signed by: Helayne Hurst MD 10/06/2024 06:36 AM EST RP Workstation: [id number]   Overnight EEG with video Result Date: 10/04/2024 Shelton Arlin KIDD, MD     10/04/2024 12:58 PM Patient Name: Anthony Price MRN: [id number] Epilepsy Attending: Arlin KIDD Shelton Referring Physician/Provider: Voncile Isles, MD Duration: 10/03/2024 9188 to 10/04/2024 9188 Patient history:  68 y.o. male with a history of altered mental status who is undergoing an EEG to evaluate for seizures. Level of alertness: Awake, asleep AEDs during EEG study: LEV, VPA Technical aspects: This EEG study was done with scalp electrodes positioned according to the 10-20 International system of electrode placement. Electrical activity was reviewed with band pass filter of 1-70Hz , sensitivity of 7 uV/mm, display speed of 53mm/sec with a 60Hz  notched filter applied as appropriate. EEG data were recorded continuously and digitally stored.  Video monitoring was available and reviewed as appropriate. Description: The posterior dominant rhythm consists of  8-9 Hz activity of moderate voltage (25-35 uV) seen predominantly in posterior head regions, symmetric and reactive to eye opening and eye closing. Sleep was characterized by vertex waves, sleep spindles (12 to 14 Hz), maximal frontocentral region. EEG showed continuous generalized 3 to 6 Hz theta-delta slowing in right fronto-temporal region. Hyperventilation and photic stimulation were not performed.   ABNORMALITY - Continuous slow, right fronto-temporal region IMPRESSION: This study is suggestive of cortical dysfunction arising from right fronto-temporal region, likely secondary to underlying structural abnormality. No seizures or epileptiform discharges were seen throughout the recording. Arlin MALVA Krebs   EEG adult Result Date: 10/01/2024 Matthews Elida HERO, MD     10/02/2024  7:10  AM Routine EEG Report ALEXIS MIZUNO is a 68 y.o. male with a history of altered mental status who is undergoing an EEG to evaluate for seizures. Report: This EEG was acquired with electrodes placed according to the International 10-20 electrode system (including Fp1, Fp2, F3, F4, C3, C4, P3, P4, O1, O2, T3, T4, T5, T6, A1, A2, Fz, Cz, Pz). The following electrodes were missing or displaced: none. The occipital dominant rhythm was 9 Hz. This activity is reactive to stimulation. Drowsiness was manifested by background fragmentation; deeper stages of sleep were identified by K complexes and sleep spindles. There was no focal slowing. There were no interictal epileptiform discharges. There were no electrographic seizures identified. Impression: This EEG was obtained while awake and asleep and is normal.   Clinical Correlation: Normal EEGs, however, do not rule out epilepsy. Elida Matthews, MD [facility] [phone] If 7pm- 7am, please page neurology on call as listed in AMION.   MR BRAIN WO CONTRAST Result Date: 10/01/2024 EXAM: MRI BRAIN WITHOUT CONTRAST 10/01/2024 05:45:03 AM TECHNIQUE: Multiplanar multisequence MRI of the head/brain was performed without the administration of intravenous contrast. COMPARISON: CT of the head dated 10/01/2024. CLINICAL HISTORY: Neuro deficit, acute, stroke suspected. FINDINGS: BRAIN AND VENTRICLES: No acute infarct. There is a heterogeneous extraaxial fluid collection again seen overlying the right cerebral hemisphere, which appears to represent a subdural hematoma. It appears to have mildly increased in size in the interim on both the axial and coronal images, with maximal thickness on the coronal imaging increasing from 15 mm to 18 mm. On the axial images, the fluid collection overlying the right frontal lobe appears to have increased in thickness from 18 mm to 22 mm. There is subarachnoid hemorrhage also present within the right cerebral vertex. No mass. There is  approximately 4 mm of shift of the midline structures to the left, similar to prior. No hydrocephalus. There are numerous foci of increased T2 signal scattered throughout the cerebral white matter. The sella is unremarkable. Normal flow voids. ORBITS: No acute abnormality. SINUSES AND MASTOIDS: No acute abnormality. BONES AND SOFT TISSUES: Normal marrow signal. No acute soft tissue abnormality. IMPRESSION: 1. Heterogeneous extraaxial fluid collection overlying the right cerebral hemisphere, consistent with a subdural hematoma, with mild interval increase in size. Maximal thickness measures 18 mm on coronal images and 22 mm on axial images. Associated approximately 4 mm leftward midline shift, similar to prior. 2. Subarachnoid hemorrhage within the right cerebral vertex. 3. Numerous foci of increased T2 signal scattered throughout the cerebral white matter. Electronically signed by: Evalene Coho MD 10/01/2024 06:17 AM EST RP Workstation: [id number]   CT HEAD WO CONTRAST Result Date: 10/01/2024 EXAM: CT HEAD WITHOUT CONTRAST 10/01/2024 12:11:09 AM TECHNIQUE: CT of the head was performed without the administration of intravenous contrast. Automated exposure control, iterative  reconstruction, and/or weight based adjustment of the mA/kV was utilized to reduce the radiation dose to as low as reasonably achievable. COMPARISON: CT head 09/12/2024 CLINICAL HISTORY: Neuro deficit, acute, stroke suspected FINDINGS: BRAIN AND VENTRICLES: Mild interval decrease in size of a mixed-density right subdural hematoma, which now measures up to 1.8 cm in thickness (previously 1.9 cm when remeasured). Slight improvement in midline shift, now 4 mm (previously 5 mm) No evidence of acute infarct. No hydrocephalus. ORBITS: No acute abnormality. SINUSES: No acute abnormality. SOFT TISSUES AND SKULL: Right craniotomy.  Drain had been removed. No skull fracture. IMPRESSION: 1. Mild interval decrease in size of a mixed-density right  subdural hematoma, which now measures up to 1.8 cm in thickness (previously 1.9 cm when remeasured). 2. Slight improvement in midline shift, now 4 mm (previously 5 mm). Electronically signed by: Gilmore Molt MD 10/01/2024 12:28 AM EST RP Workstation: [id number]   CT HEAD WO CONTRAST Result Date: 09/12/2024 EXAM: CT HEAD WITHOUT CONTRAST 09/12/2024 05:56:30 AM TECHNIQUE: CT of the head was performed without the administration of intravenous contrast. Automated exposure control, iterative reconstruction, and/or weight based adjustment of the mA/kV was utilized to reduce the radiation dose to as low as reasonably achievable. COMPARISON: CT of the head dated 09/11/2024. CLINICAL HISTORY: FINDINGS: BRAIN AND VENTRICLES: The patient has undergone right parietal craniotomy and placement of a subdural drain. There is a chronic right-sided subdural hematoma, which has decreased in size in the interim. There is now less mass effect upon the right cerebral hemisphere with interval decrease in shift of the midline structures to the left from 9 mm to 5 mm. There has been interval right subdural hemorrhage. Moderate intracranial air is now present. No evidence of acute infarct. No hydrocephalus. ORBITS: No acute abnormality. SINUSES: No acute abnormality. SOFT TISSUES AND SKULL: Right parietal craniotomy and subdural drain are present. No acute soft tissue abnormality. No skull fracture. IMPRESSION: 1. Interval decrease in size of the chronic right-sided subdural hematoma and associated mass effect, with midline shift now measuring 5 mm (previously 9 mm). 2. Interval hemorrhage into the right subdural collection, but overall decreased in size. 3. Moderate intracranial air. Electronically signed by: Evalene Coho MD 09/12/2024 06:24 AM EST RP Workstation: [id number]   CT ANGIO HEAD NECK W WO CM Result Date: 09/11/2024 EXAM: CTA HEAD AND NECK WITH AND WITHOUT 09/11/2024 10:59:41 AM TECHNIQUE: CTA of the head and neck  was performed with and without the administration of 60 mL of iohexol  (OMNIPAQUE ) 350 MG/ML intravenous contrast. Multiplanar 2D and/or 3D reformatted images are provided for review. Automated exposure control, iterative reconstruction, and/or weight based adjustment of the mA/kV was utilized to reduce the radiation dose to as low as reasonably achievable. Stenosis of the internal carotid arteries measured using NASCET criteria. COMPARISON: None available CLINICAL HISTORY: Neuro deficit, acute, stroke suspected; Confusion aphasia. FINDINGS: CTA NECK: AORTIC ARCH AND ARCH VESSELS: No dissection or arterial injury. No significant stenosis of the brachiocephalic or subclavian arteries. CERVICAL CAROTID ARTERIES: No dissection, arterial injury, or hemodynamically significant stenosis by NASCET criteria. CERVICAL VERTEBRAL ARTERIES: No dissection, arterial injury, or significant stenosis. LUNGS AND MEDIASTINUM: Unremarkable. SOFT TISSUES: No acute abnormality. BONES: No acute abnormality. CTA HEAD: ANTERIOR CIRCULATION: No significant stenosis of the internal carotid arteries. No significant stenosis of the anterior cerebral arteries. No significant stenosis of the middle cerebral arteries. No aneurysm. POSTERIOR CIRCULATION: No significant stenosis of the posterior cerebral arteries. No significant stenosis of the basilar artery. No significant stenosis of the vertebral  arteries. No aneurysm. OTHER: No dural venous sinus thrombosis on this non-dedicated study. Traumatic brain injury risk stratification: Skull fracture: no (low - MBIG 1) Subdural hematoma (SDH): large right chronic hemispheric subdural hematoma present, which measures up to 2.4 cm in thickness and extends approximately 11 cm in AP dimension and 7.3 cm in craniocaudad length (high - MBIG 3) Subarachnoid hemorrhage [facility]): no Epidural hematoma (EDH): no (low - MBIG 1) Cerebral contusion, intra-axial, intraparenchymal hemorrhage (IPH): no Intraventricular  hemorrhage (IVH): no (low - MBIG 1) Midline shift > 1mm or edema/effacement of sulci/vents: moderate mass effect upon the right cerebral hemisphere with shifted midline structures to the left by approximately 9 mm. The right cerebral sulci are largely effaced (high - MBIG 3) IMPRESSION: 1. Large right chronic hemispheric subdural hematoma measuring up to 2.4 cm in thickness with approximately 11 cm AP and 7.3 cm craniocaudal extent, causing moderate mass effect with 9 mm leftward midline shift and near-complete effacement of right cerebral sulci; calvarium intact 2. TBI risk stratification: high - mbig 3 3. Findings discussed with Dr. Lamar Pries he erson at 11:17 AM 09/11/24 Electronically signed by: Evalene Coho MD 09/11/2024 11:18 AM EST RP Workstation: [id number]    Subjective: Patient seen and examined at bedside.  Overnight events noted. Patient reports feeling much better and wants to be discharged home.  Discharge Exam: Vitals:   10/08/24 1200 10/08/24 1300  BP: 120/80 114/79  Pulse: 86 82  Resp: 13 13  Temp: 98.3 F (36.8 C)   SpO2: 98% 99%   Vitals:   10/08/24 0900 10/08/24 1000 10/08/24 1200 10/08/24 1300  BP: 119/66 127/67 120/80 114/79  Pulse: 96 97 86 82  Resp: 10 10 13 13   Temp:   98.3 F (36.8 C)   TempSrc:   Axillary   SpO2: 97% 98% 98% 99%  Weight:      Height:        General: Pt is alert, awake, not in acute distress Cardiovascular: RRR, S1/S2 +, no rubs, no gallops Respiratory: CTA bilaterally, no wheezing, no rhonchi Abdominal: Soft, NT, ND, bowel sounds + Extremities: no edema, no cyanosis    The results of significant diagnostics from this hospitalization (including imaging, microbiology, ancillary and laboratory) are listed below for reference.     Microbiology: No results found for this or any previous visit (from the past 240 hours).   Labs: BNP (last 3 results) No results for input(s): BNP in the last 8760 hours. Basic Metabolic  Panel: Recent Labs  Lab 10/03/24 0212 10/04/24 0301 10/05/24 0439 10/06/24 0150 10/07/24 0354  NA 137 138 139 138 138  K 3.6 3.4* 3.4* 4.2 4.4  CL 101 104 106 107 108  CO2 23 18* 24 19* 22  GLUCOSE 155* 111* 120* 166* 214*  BUN 15 17 20 23  28*  CREATININE 1.49* 1.59* 1.48* 1.37* 1.33*  CALCIUM  9.0 8.8* 8.8* 8.2* 8.2*  MG  --  1.8 1.8  --  1.8  PHOS  --   --   --   --  2.9   Liver Function Tests: Recent Labs  Lab 10/06/24 0150  AST 14*  ALT 9  ALKPHOS 60  BILITOT 0.8  PROT 6.4*  ALBUMIN 3.0*   No results for input(s): LIPASE, AMYLASE in the last 168 hours. No results for input(s): AMMONIA in the last 168 hours. CBC: Recent Labs  Lab 10/02/24 0740 10/05/24 0439 10/06/24 0150 10/07/24 0354  WBC 6.7 5.8 4.4 7.1  HGB 10.7* 11.4* 11.7*  10.5*  HCT 36.3* 37.0* 39.6 34.2*  MCV 75.2* 74.4* 77.3* 75.3*  PLT 128* 164 131* 158   Cardiac Enzymes: No results for input(s): CKTOTAL, CKMB, CKMBINDEX, TROPONINI in the last 168 hours. BNP: Invalid input(s): POCBNP CBG: Recent Labs  Lab 10/07/24 1220 10/07/24 1551 10/07/24 2111 10/08/24 0757 10/08/24 1208  GLUCAP 214* 196* 155* 167* 173*   D-Dimer No results for input(s): DDIMER in the last 72 hours. Hgb A1c No results for input(s): HGBA1C in the last 72 hours. Lipid Profile No results for input(s): CHOL, HDL, LDLCALC, TRIG, CHOLHDL, LDLDIRECT in the last 72 hours. Thyroid  function studies No results for input(s): TSH, T4TOTAL, T3FREE, THYROIDAB in the last 72 hours.  Invalid input(s): FREET3 Anemia work up No results for input(s): VITAMINB12, FOLATE, FERRITIN, TIBC, IRON, RETICCTPCT in the last 72 hours. Urinalysis    Component Value Date/Time   COLORURINE STRAW (A) 01/30/2024 2245   APPEARANCEUR CLEAR 01/30/2024 2245   LABSPEC 1.020 01/30/2024 2245   PHURINE 6.0 01/30/2024 2245   GLUCOSEU >=500 (A) 01/30/2024 2245   HGBUR NEGATIVE 01/30/2024 2245    BILIRUBINUR NEGATIVE 01/30/2024 2245   KETONESUR NEGATIVE 01/30/2024 2245   PROTEINUR 30 (A) 01/30/2024 2245   NITRITE NEGATIVE 01/30/2024 2245   LEUKOCYTESUR NEGATIVE 01/30/2024 2245   Sepsis Labs Recent Labs  Lab 10/02/24 0740 10/05/24 0439 10/06/24 0150 10/07/24 0354  WBC 6.7 5.8 4.4 7.1   Microbiology No results found for this or any previous visit (from the past 240 hours).   Time coordinating discharge: Over 30 minutes  SIGNED:   Darcel Dawley, MD  [facility] 10/08/2024, 5:39 PM Pager   If 7PM-7AM, please contact night-coverage     [1]  Allergies Allergen Reactions   Lactose Intolerance (Gi) Diarrhea and Other (See Comments)    Bloating    Ultram [Tramadol] Nausea Only

## 2024-10-08 NOTE — Progress Notes (Signed)
 Patient ID: Anthony Price, male   DOB: May 31, 1956, 68 y.o.   MRN: [id number] BP 119/66   Pulse 96   Temp 97.9 F (36.6 C) (Axillary)   Resp 10   Ht 5' 7 (1.702 m)   Wt 54.9 kg   SpO2 97%   BMI 18.95 kg/m  Alert and oriented x 4, speech is clear and fluent Moving all extremities well. Wound is clean, dry, no signs of infection.   Ok for discharge.

## 2024-10-08 NOTE — Progress Notes (Unsigned)
 {  Select_TRH_Note:[id number]}

## 2024-10-08 NOTE — Progress Notes (Addendum)
 Physical Therapy Treatment Patient Details Name: Anthony Price MRN: [id number] DOB: 1955/10/29 Today's Date: 10/08/2024   History of Present Illness 68 year old male who returns 09/30/24 after recent admission with R SDH 11/16 with right frontotemporoparietal craniotomy. 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 Had witnessed seizure in ED.  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 12/10 crani for SDH reaccumulation.    PT Comments  Pt with improved function and mobility this session.  Minor balance and inattention to L side remain.  He would benefit from change in recommendations from AIR to OP-neuro PT.  He has support from sister at d/c and does not require any DME for d/c home today. Based on presentation today will inform supervising PT of need for change in recommendations at this time.  BP pre tx 132/70 MAP-88 BP post tx 140/81 MAP-99      If plan is discharge home, recommend the following: A lot of help with walking and/or transfers;A lot of help with bathing/dressing/bathroom;Assistance with cooking/housework;Help with stairs or ramp for entrance   Can travel by private vehicle        Equipment Recommendations  None recommended by PT (gt belt issued to sister for support.)    Recommendations for Other Services Speech consult     Precautions / Restrictions Precautions Precautions: Fall Recall of Precautions/Restrictions: Intact Precaution/Restrictions Comments: SBP < 160 DBP < 100 Restrictions Weight Bearing Restrictions Per Provider Order: No     Mobility  Bed Mobility Overal bed mobility: Modified Independent                  Transfers Overall transfer level: Needs assistance Equipment used: None Transfers: Sit to/from Stand Sit to Stand: Supervision            General transfer comment: No assistance needed to rise into standing.  Pt with L weakness in L hand so RW would not be of assistance but more of a hindrance.    Ambulation/Gait Ambulation/Gait assistance: Contact guard assist Gait Distance (Feet): 250 Feet Assistive device: None Gait Pattern/deviations: Step-through pattern, Narrow base of support, Drifts right/left Gait velocity: decreased     General Gait Details: Cues for obstacle negotiation and L inattention.   Stairs             Wheelchair Mobility     Tilt Bed    Modified Rankin (Stroke Patients Only) Modified Rankin (Stroke Patients Only) Pre-Morbid Rankin Score: No symptoms Modified Rankin: Moderately severe disability     Balance Overall balance assessment: Needs assistance, History of Falls Sitting-balance support: Feet supported Sitting balance-Leahy Scale: Good       Standing balance-Leahy Scale: Fair                              Communication Communication Communication: No apparent difficulties;Impaired Factors Affecting Communication: Difficulty expressing self  Cognition Arousal: Alert Behavior During Therapy: WFL for tasks assessed/performed   PT - Cognitive impairments: Safety/Judgement Difficult to assess due to: Impaired communication (slow response time to questions)                     PT - Cognition Comments: pt AxOx4 but all responses slow with expressive difficulties.Pt with L side inattention but able to attend when cued. Following commands: Intact Following  commands impaired: Only follows one step commands consistently, Follows one step commands with increased time    Cueing Cueing Techniques: Verbal cues  Exercises      General Comments        Pertinent Vitals/Pain Pain Assessment Pain Assessment: No/denies pain    Home Living                          Prior Function            PT Goals (current goals can now be found in the care  plan section) Acute Rehab PT Goals Patient Stated Goal: To go home Potential to Achieve Goals: Good Additional Goals Additional Goal #1: Patient will score >19 on DGI, indicative of no increased fall risk. Progress towards PT goals: Progressing toward goals    Frequency    Min 2X/week      PT Plan      Co-evaluation              AM-PAC PT 6 Clicks Mobility   Outcome Measure  Help needed turning from your back to your side while in a flat bed without using bedrails?: None Help needed moving from lying on your back to sitting on the side of a flat bed without using bedrails?: A Little Help needed moving to and from a bed to a chair (including a wheelchair)?: A Little Help needed standing up from a chair using your arms (e.g., wheelchair or bedside chair)?: A Little Help needed to walk in hospital room?: A Little Help needed climbing 3-5 steps with a railing? : A Little 6 Click Score: 19    End of Session Equipment Utilized During Treatment: Gait belt Activity Tolerance: Patient tolerated treatment well Patient left: in chair;with call bell/phone within reach;with chair alarm set Nurse Communication: Mobility status PT Visit Diagnosis: Difficulty in walking, not elsewhere classified (R26.2);Pain;Unsteadiness on feet (R26.81) Pain - part of body:  (head)     Time: 8778-8761 PT Time Calculation (min) (ACUTE ONLY): 17 min  Charges:    $Gait Training: 8-22 mins PT General Charges $$ ACUTE PT VISIT: 1 Visit                     Anthony Price , PTA Acute Rehabilitation Services Office [phone]    Anthony Price 10/08/2024, 12:47 PM

## 2024-10-08 NOTE — Progress Notes (Signed)
 Pt discharged to home by car with family. Assessment stable. D/C instructions reviewed and all questions answered.

## 2024-10-08 NOTE — Discharge Instructions (Signed)
 Advised to follow-up with primary care physician in 1 week. Advised to follow-up with neurosurgery in 1 week. Advised to continue Keppra  and Depakote  as prescribed.

## 2024-10-08 NOTE — TOC Progression Note (Addendum)
 Transition of Care [facility]) - Progression Note    Patient Details  Name: Anthony Price MRN: [id number] Date of Birth: December 10, 1955  [facility] CM/SW Contact  Robynn Eileen Hoose, RN Phone Number: 10/08/2024, 11:43 AM  Clinical Narrative:   Secure message from provider regardiging patient needed home health services arranged. Referral to [facility] sent via [facility] portal, awaiting response.  1250: Patient seen by PT today, recommendations changed from [facility] to outpatient neuro physical therapy. Referral number [id number], placed for [facility] clinic. Contact information placed on AVS.    Expected Discharge Plan: IP Rehab Facility Barriers to Discharge: Continued Medical Work up               Expected Discharge Plan and Services       Living arrangements for the past 2 months: Single Family Home Expected Discharge Date: 10/08/24                                     Social Drivers of Health (SDOH) Interventions SDOH Screenings   Food Insecurity: Food Insecurity Present (10/01/2024)  Housing: Low Risk (10/01/2024)  Transportation Needs: No Transportation Needs (10/01/2024)  Recent Concern: Transportation Needs - Unmet Transportation Needs (07/20/2024)   Received from [facility]  Utilities: Not At Risk (10/01/2024)  Alcohol Screen: Low Risk (09/30/2023)  Depression (PHQ2-9): Low Risk (09/15/2024)  Recent Concern: Depression (PHQ2-9) - High Risk (08/10/2024)  Financial Resource Strain: Low Risk (09/30/2023)  Physical Activity: Sufficiently Active (09/30/2023)  Social Connections: Moderately Isolated (10/01/2024)  Stress: Stress Concern Present (09/30/2023)  Tobacco Use: Medium Risk (10/05/2024)  Health Literacy: Adequate Health Literacy (09/30/2023)    Readmission Risk Interventions     No data to display

## 2024-10-10 ENCOUNTER — Telehealth: Payer: Self-pay | Admitting: *Deleted

## 2024-10-10 NOTE — Transitions of Care (Post Inpatient/ED Visit) (Signed)
° °  10/10/2024  Name: Anthony Price MRN: [id number] DOB: Jun 10, 1956  Today's TOC FU Call Status: Today's TOC FU Call Status:: Unsuccessful Call (1st Attempt) Unsuccessful Call (1st Attempt) Date: 10/10/24  Attempted to reach the patient regarding the most recent Inpatient/ED visit.  Follow Up Plan: Additional outreach attempts will be made to reach the patient to complete the Transitions of Care (Post Inpatient/ED visit) call.   Mliss Creed [facility], BSN RN Care Manager/ Transition of Care [place]/ [facility] [phone]

## 2024-10-11 ENCOUNTER — Telehealth: Payer: Self-pay | Admitting: *Deleted

## 2024-10-11 NOTE — Transitions of Care (Post Inpatient/ED Visit) (Signed)
° °  10/11/2024  Name: EDISON NICHOLSON MRN: [id number] DOB: 07-03-1956  Today's TOC FU Call Status: Today's TOC FU Call Status:: Unsuccessful Call (2nd Attempt) Unsuccessful Call (2nd Attempt) Date: 10/11/24  Attempted to reach the patient regarding the most recent Inpatient/ED visit.  Follow Up Plan: Additional outreach attempts will be made to reach the patient to complete the Transitions of Care (Post Inpatient/ED visit) call.   Mliss Creed [facility], BSN RN Care Manager/ Transition of Care [place]/ [facility] [phone]

## 2024-10-12 ENCOUNTER — Telehealth: Payer: Self-pay | Admitting: *Deleted

## 2024-10-12 NOTE — Transitions of Care (Post Inpatient/ED Visit) (Signed)
° °  10/12/2024  Name: Anthony Price MRN: [id number] DOB: September 23, 1956  Today's TOC FU Call Status: Today's TOC FU Call Status:: Unsuccessful Call (3rd Attempt) Unsuccessful Call (3rd Attempt) Date: 10/12/24  Attempted to reach the patient regarding the most recent Inpatient/ED visit.  Follow Up Plan: No further outreach attempts will be made at this time. We have been unable to contact the patient.  Mliss Creed [facility], BSN RN Care Manager/ Transition of Care [place]/ [facility] [phone]

## 2024-11-11 ENCOUNTER — Other Ambulatory Visit: Payer: Self-pay | Admitting: Family Medicine

## 2024-11-11 DIAGNOSIS — K219 Gastro-esophageal reflux disease without esophagitis: Secondary | ICD-10-CM

## 2024-11-16 ENCOUNTER — Encounter: Payer: Self-pay | Admitting: Family Medicine

## 2024-11-16 ENCOUNTER — Ambulatory Visit: Payer: Self-pay | Admitting: Family Medicine

## 2024-11-16 VITALS — BP 127/73 | HR 83 | Temp 98.0°F | Ht 67.0 in | Wt 119.0 lb

## 2024-11-16 DIAGNOSIS — Z23 Encounter for immunization: Secondary | ICD-10-CM

## 2024-11-16 DIAGNOSIS — E1159 Type 2 diabetes mellitus with other circulatory complications: Secondary | ICD-10-CM | POA: Diagnosis not present

## 2024-11-16 DIAGNOSIS — N183 Chronic kidney disease, stage 3 unspecified: Secondary | ICD-10-CM | POA: Diagnosis not present

## 2024-11-16 DIAGNOSIS — I152 Hypertension secondary to endocrine disorders: Secondary | ICD-10-CM

## 2024-11-16 DIAGNOSIS — Z125 Encounter for screening for malignant neoplasm of prostate: Secondary | ICD-10-CM | POA: Diagnosis not present

## 2024-11-16 DIAGNOSIS — E119 Type 2 diabetes mellitus without complications: Secondary | ICD-10-CM

## 2024-11-16 DIAGNOSIS — N1832 Chronic kidney disease, stage 3b: Secondary | ICD-10-CM

## 2024-11-16 DIAGNOSIS — E781 Pure hyperglyceridemia: Secondary | ICD-10-CM | POA: Diagnosis not present

## 2024-11-16 DIAGNOSIS — E1122 Type 2 diabetes mellitus with diabetic chronic kidney disease: Secondary | ICD-10-CM

## 2024-11-16 DIAGNOSIS — Z794 Long term (current) use of insulin: Secondary | ICD-10-CM

## 2024-11-16 LAB — BAYER DCA HB A1C WAIVED: HB A1C (BAYER DCA - WAIVED): 8 % — ABNORMAL HIGH (ref 4.8–5.6)

## 2024-11-16 NOTE — Progress Notes (Signed)
 "  BP 127/73   Pulse 83   Temp 98 F (36.7 C)   Ht 5' 7 (1.702 m)   Wt 119 lb (54 kg)   SpO2 100%   BMI 18.64 kg/m    Subjective:   Patient ID: Anthony Price, male    DOB: [birth year], 69 y.o.   MRN: [id number]  HPI: Anthony Price is a 70 y.o. male presenting on 11/16/2024 for Medical Management of Chronic Issues, Chronic Kidney Disease, and Diabetes   Discussed the use of AI scribe software for clinical note transcription with the patient, who gave verbal consent to proceed.  History of Present Illness   Anthony Price is a 69 year old male who presents for a recheck following falls and a brain bleed.  Subdural hematoma and falls - Experienced two falls, timing unclear, resulting in subdural hematoma - Underwent craniotomy and hematoma evacuation - Memory impairment present but improving - Currently more stable with no further falls - Has not participated in physical therapy  Seizure management - Seizures occurred during hospital stay - Prescribed Keppra  (levetiracetam ) for seizure prophylaxis - No seizures since hospital discharge - Under care of neurologist in [facility]  Lymphoproliferative disorder - History of post-transplant lymphoproliferative disorder, specifically lymphoma - 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 - Trial drug for lymphoma discontinued due to seizures - Awaiting further follow-up with oncologist  Diabetes mellitus - History of diabetes mellitus - Blood glucose levels were elevated during hospital stay - Currently taking Farxiga , Tresiba  (9 units), metformin , and Januvia  - No symptoms of hypoglycemia such as jitteriness or shakiness          Relevant past medical, surgical, family and social history reviewed and updated as indicated. Interim medical history since our last visit reviewed. Allergies and medications reviewed and updated.  Review of Systems   Constitutional:  Negative for chills and fever.  Eyes:  Negative for visual disturbance.  Respiratory:  Negative for shortness of breath and wheezing.   Cardiovascular:  Negative for chest pain and leg swelling.  Musculoskeletal:  Negative for back pain and gait problem.  Skin:  Negative for rash.  Neurological:  Positive for seizures and speech difficulty. Negative for dizziness and headaches.  Psychiatric/Behavioral:  Positive for decreased concentration. Negative for hallucinations, sleep disturbance and suicidal ideas. The patient is not nervous/anxious.   All other systems reviewed and are negative.   Per HPI unless specifically indicated above   Allergies as of 11/16/2024       Reactions   Lactose Intolerance (gi) Diarrhea, Other (See Comments)   Bloating    Ultram [tramadol] Nausea Only        Medication List        Accurate as of November 16, 2024 11:28 AM. If you have any questions, ask your nurse or doctor.          acetaminophen  500 MG tablet Commonly known as: TYLENOL  Take 1,000 mg by mouth 2 (two) times daily as needed for headache or fever (pain).   ALPRAZolam  1 MG tablet Commonly known as: XANAX  TAKE (1) TABLET BY MOUTH THREE TIMES DAILY.   amLODipine  2.5 MG tablet Commonly known as: NORVASC  Take 1 tablet (2.5 mg total) by mouth daily.   atorvastatin  40 MG tablet Commonly known as: LIPITOR Take 1 tablet (40 mg total) by mouth daily. What changed: when to take this   buPROPion  300 MG 24 hr tablet Commonly known as:  WELLBUTRIN  XL TAKE 1 TABLET BY MOUTH ONCE EVERY MORNING.   carvedilol  12.5 MG tablet Commonly known as: COREG  TAKE (1) TABLET BY MOUTH TWICE DAILY.   dapagliflozin  propanediol 10 MG Tabs tablet Commonly known as: Farxiga  Take 1 tablet (10 mg total) by mouth daily before breakfast.   divalproex  500 MG DR tablet Commonly known as: DEPAKOTE  Take 1 tablet (500 mg total) by mouth every 12 (twelve) hours.   esomeprazole  20 MG  capsule Commonly known as: NEXIUM  TAKE (1) CAPSULE BY MOUTH TWICE DAILY BEFORE A MEAL   fenofibrate  48 MG tablet Commonly known as: TRICOR  Take 1 tablet (48 mg total) by mouth daily.   Januvia  50 MG tablet Generic drug: sitaGLIPtin  TAKE ONE TABLET BY MOUTH EVERY DAY   levETIRAcetam  750 MG tablet Commonly known as: KEPPRA  Take 2 tablets (1,500 mg total) by mouth 2 (two) times daily.   MAGNESIUM  PO Take 1 tablet by mouth every evening.   metFORMIN  500 MG tablet Commonly known as: GLUCOPHAGE  Take 1 tablet (500 mg total) by mouth 2 (two) times daily with a meal.   mycophenolate  250 MG capsule Commonly known as: CELLCEPT  Take 250 mg by mouth 2 (two) times daily.   OMEGA-3 PO Take 1-2 capsules by mouth See admin instructions. Take 1 capsule by mouth in the morning and take 2 capsules at night.   tacrolimus  0.5 MG capsule Commonly known as: PROGRAF  Take 0.5 mg by mouth in the morning and at bedtime.   Tresiba  FlexTouch 100 UNIT/ML FlexTouch Pen Generic drug: insulin  degludec Inject 9 Units into the skin daily. Inject 9 Units into the skin daily. What changed:  when to take this additional instructions   Vilazodone  HCl 20 MG Tabs Commonly known as: Viibryd  Take 1 tablet (20 mg total) by mouth daily.         Objective:   BP 127/73   Pulse 83   Temp 98 F (36.7 C)   Ht 5' 7 (1.702 m)   Wt 119 lb (54 kg)   SpO2 100%   BMI 18.64 kg/m   Wt Readings from Last 3 Encounters:  11/16/24 119 lb (54 kg)  09/30/24 121 lb (54.9 kg)  09/13/24 126 lb 8.7 oz (57.4 kg)    Physical Exam Vitals and nursing note reviewed.  Constitutional:      Appearance: Normal appearance.  Cardiovascular:     Rate and Rhythm: Regular rhythm. Tachycardia present.  Neurological:     Mental Status: He is alert.    Physical Exam   CHEST: Lungs clear to auscultation bilaterally. CARDIOVASCULAR: Heart regular rate and rhythm, no murmurs.         Assessment & Plan:   Problem List  Items Addressed This Visit       Cardiovascular and Mediastinum   Hypertension associated with diabetes (HCC)   Relevant Orders   CBC with Differential/Platelet   TSH     Endocrine   Diabetes type 2, controlled (HCC)   Relevant Orders   Bayer DCA Hb A1c Waived   CBC with Differential/Platelet   TSH   Microalbumin / creatinine urine ratio   CKD stage 3 due to type 2 diabetes mellitus (HCC)   Relevant Orders   CBC with Differential/Platelet   TSH     Other   Hypertriglyceridemia   Other Visit Diagnoses       Type 2 diabetes mellitus with stage 3b chronic kidney disease, without long-term current use of insulin  (HCC)    -  Primary  Relevant Orders   Bayer DCA Hb A1c Waived   CBC with Differential/Platelet   TSH   PSA, total and free   Microalbumin / creatinine urine ratio     Prostate cancer screening       Relevant Orders   PSA, total and free          Type 2 diabetes mellitus Blood glucose levels elevated during hospitalization. No hypoglycemic episodes reported. - Checked blood glucose levels today. - Continue Farxiga , Tresiba , metformin , and Januvia . - A1c is up at 8.0 but with everything going with recovering and the seizures and other things, I do not want to change his medicines today but just more focus on controlling the blood sugars through diet.  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 Neurology follow-up ongoing. - Monitor for seizure recurrence and report to neurologist if he occurs.          Follow up plan: Return in about 3 months (around 02/14/2025), or if symptoms worsen or fail to improve, for Diabetes recheck.  Counseling provided for all of the vaccine components Orders Placed This Encounter  Procedures   Bayer DCA Hb A1c Waived   CBC with Differential/Platelet   TSH   PSA, total and free   Microalbumin / creatinine urine ratio    Fonda Levins,  MD [facility] 11/16/2024, 11:28 AM     "

## 2024-11-17 LAB — CBC WITH DIFFERENTIAL/PLATELET
Basophils Absolute: 0.1 x10E3/uL (ref 0.0–0.2)
Basos: 1 %
EOS (ABSOLUTE): 0.4 x10E3/uL (ref 0.0–0.4)
Eos: 3 %
Hematocrit: 38.5 % (ref 37.5–51.0)
Hemoglobin: 11.5 g/dL — ABNORMAL LOW (ref 13.0–17.7)
Immature Grans (Abs): 0.2 x10E3/uL — ABNORMAL HIGH (ref 0.0–0.1)
Immature Granulocytes: 2 %
Lymphocytes Absolute: 4 x10E3/uL — ABNORMAL HIGH (ref 0.7–3.1)
Lymphs: 38 %
MCH: 22.6 pg — ABNORMAL LOW (ref 26.6–33.0)
MCHC: 29.9 g/dL — ABNORMAL LOW (ref 31.5–35.7)
MCV: 76 fL — ABNORMAL LOW (ref 79–97)
Monocytes Absolute: 1.4 x10E3/uL — ABNORMAL HIGH (ref 0.1–0.9)
Monocytes: 13 %
Neutrophils Absolute: 4.5 x10E3/uL (ref 1.4–7.0)
Neutrophils: 43 %
Platelets: 220 x10E3/uL (ref 150–450)
RBC: 5.08 x10E6/uL (ref 4.14–5.80)
RDW: 17.5 % — ABNORMAL HIGH (ref 11.6–15.4)
WBC: 10.6 x10E3/uL (ref 3.4–10.8)

## 2024-11-17 LAB — PSA, TOTAL AND FREE
PSA, Free Pct: 27.9 %
PSA, Free: 0.39 ng/mL
Prostate Specific Ag, Serum: 1.4 ng/mL (ref 0.0–4.0)

## 2024-11-17 LAB — TSH: TSH: 1.25 u[IU]/mL (ref 0.450–4.500)

## 2024-11-23 ENCOUNTER — Ambulatory Visit: Payer: Self-pay | Admitting: Family Medicine

## 2025-02-15 ENCOUNTER — Ambulatory Visit: Admitting: Family Medicine
